# Patient Record
Sex: Female | Born: 1940
Health system: Southern US, Community
[De-identification: ages and names within clinical notes are randomized; demographics above are authoritative.]

## PROBLEM LIST (undated history)

## (undated) DIAGNOSIS — I2510 Atherosclerotic heart disease of native coronary artery without angina pectoris: Secondary | ICD-10-CM

## (undated) DIAGNOSIS — J45909 Unspecified asthma, uncomplicated: Secondary | ICD-10-CM

## (undated) DIAGNOSIS — Z531 Procedure and treatment not carried out because of patient's decision for reasons of belief and group pressure: Secondary | ICD-10-CM

## (undated) DIAGNOSIS — T8859XA Other complications of anesthesia, initial encounter: Secondary | ICD-10-CM

## (undated) DIAGNOSIS — I82409 Acute embolism and thrombosis of unspecified deep veins of unspecified lower extremity: Secondary | ICD-10-CM

## (undated) DIAGNOSIS — T4145XA Adverse effect of unspecified anesthetic, initial encounter: Secondary | ICD-10-CM

## (undated) DIAGNOSIS — IMO0001 Reserved for inherently not codable concepts without codable children: Secondary | ICD-10-CM

## (undated) DIAGNOSIS — M1990 Unspecified osteoarthritis, unspecified site: Secondary | ICD-10-CM

## (undated) DIAGNOSIS — E041 Nontoxic single thyroid nodule: Secondary | ICD-10-CM

## (undated) HISTORY — PX: LIPOMA EXCISION: SHX5283

## (undated) HISTORY — PX: TOTAL KNEE ARTHROPLASTY: SHX125

## (undated) HISTORY — PX: DILATION AND CURETTAGE OF UTERUS: SHX78

---
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
## 1998-09-25 ENCOUNTER — Emergency Department (HOSPITAL_COMMUNITY): Admission: EM | Admit: 1998-09-25 | Discharge: 1998-09-25 | Payer: Self-pay | Admitting: *Deleted

## 2000-01-09 ENCOUNTER — Ambulatory Visit (HOSPITAL_COMMUNITY): Admission: RE | Admit: 2000-01-09 | Discharge: 2000-01-09 | Payer: Self-pay | Admitting: *Deleted

## 2002-08-14 ENCOUNTER — Ambulatory Visit (HOSPITAL_COMMUNITY): Admission: RE | Admit: 2002-08-14 | Discharge: 2002-08-14 | Payer: Self-pay | Admitting: Family Medicine

## 2002-08-14 ENCOUNTER — Encounter: Payer: Self-pay | Admitting: Family Medicine

## 2003-04-22 ENCOUNTER — Encounter: Payer: Self-pay | Admitting: Obstetrics and Gynecology

## 2003-04-22 ENCOUNTER — Ambulatory Visit (HOSPITAL_COMMUNITY): Admission: RE | Admit: 2003-04-22 | Discharge: 2003-04-22 | Payer: Self-pay | Admitting: Obstetrics and Gynecology

## 2007-02-09 LAB — CONVERTED CEMR LAB

## 2007-05-27 DIAGNOSIS — Z9189 Other specified personal risk factors, not elsewhere classified: Secondary | ICD-10-CM | POA: Insufficient documentation

## 2007-05-27 DIAGNOSIS — R8789 Other abnormal findings in specimens from female genital organs: Secondary | ICD-10-CM | POA: Insufficient documentation

## 2007-07-02 ENCOUNTER — Ambulatory Visit: Payer: Self-pay | Admitting: Nurse Practitioner

## 2007-07-02 DIAGNOSIS — F411 Generalized anxiety disorder: Secondary | ICD-10-CM

## 2007-07-02 DIAGNOSIS — M19019 Primary osteoarthritis, unspecified shoulder: Secondary | ICD-10-CM | POA: Insufficient documentation

## 2007-07-03 ENCOUNTER — Encounter (INDEPENDENT_AMBULATORY_CARE_PROVIDER_SITE_OTHER): Payer: Self-pay | Admitting: Nurse Practitioner

## 2007-08-05 ENCOUNTER — Encounter (INDEPENDENT_AMBULATORY_CARE_PROVIDER_SITE_OTHER): Payer: Self-pay | Admitting: Nurse Practitioner

## 2007-08-11 ENCOUNTER — Ambulatory Visit: Payer: Self-pay | Admitting: Nurse Practitioner

## 2007-08-11 DIAGNOSIS — K5289 Other specified noninfective gastroenteritis and colitis: Secondary | ICD-10-CM | POA: Insufficient documentation

## 2007-08-14 ENCOUNTER — Encounter (INDEPENDENT_AMBULATORY_CARE_PROVIDER_SITE_OTHER): Payer: Self-pay | Admitting: Nurse Practitioner

## 2007-08-19 ENCOUNTER — Ambulatory Visit: Payer: Self-pay | Admitting: Nurse Practitioner

## 2007-10-13 ENCOUNTER — Ambulatory Visit: Payer: Self-pay | Admitting: *Deleted

## 2007-10-13 ENCOUNTER — Ambulatory Visit: Payer: Self-pay | Admitting: Nurse Practitioner

## 2007-10-13 DIAGNOSIS — J45909 Unspecified asthma, uncomplicated: Secondary | ICD-10-CM | POA: Insufficient documentation

## 2007-10-23 ENCOUNTER — Telehealth (INDEPENDENT_AMBULATORY_CARE_PROVIDER_SITE_OTHER): Payer: Self-pay | Admitting: Nurse Practitioner

## 2007-10-24 ENCOUNTER — Ambulatory Visit (HOSPITAL_COMMUNITY): Admission: RE | Admit: 2007-10-24 | Discharge: 2007-10-24 | Payer: Self-pay | Admitting: Nurse Practitioner

## 2007-10-28 ENCOUNTER — Encounter (INDEPENDENT_AMBULATORY_CARE_PROVIDER_SITE_OTHER): Payer: Self-pay | Admitting: Nurse Practitioner

## 2007-10-28 ENCOUNTER — Telehealth (INDEPENDENT_AMBULATORY_CARE_PROVIDER_SITE_OTHER): Payer: Self-pay | Admitting: Nurse Practitioner

## 2007-10-28 DIAGNOSIS — M7730 Calcaneal spur, unspecified foot: Secondary | ICD-10-CM

## 2007-10-28 DIAGNOSIS — K029 Dental caries, unspecified: Secondary | ICD-10-CM | POA: Insufficient documentation

## 2007-10-30 ENCOUNTER — Encounter (INDEPENDENT_AMBULATORY_CARE_PROVIDER_SITE_OTHER): Payer: Self-pay | Admitting: Nurse Practitioner

## 2007-11-09 LAB — CONVERTED CEMR LAB: Pap Smear: NORMAL

## 2007-11-18 ENCOUNTER — Encounter (INDEPENDENT_AMBULATORY_CARE_PROVIDER_SITE_OTHER): Payer: Self-pay | Admitting: Nurse Practitioner

## 2007-12-11 ENCOUNTER — Ambulatory Visit: Payer: Self-pay | Admitting: Nurse Practitioner

## 2007-12-11 DIAGNOSIS — R030 Elevated blood-pressure reading, without diagnosis of hypertension: Secondary | ICD-10-CM | POA: Insufficient documentation

## 2007-12-11 DIAGNOSIS — D259 Leiomyoma of uterus, unspecified: Secondary | ICD-10-CM | POA: Insufficient documentation

## 2007-12-23 ENCOUNTER — Ambulatory Visit (HOSPITAL_COMMUNITY): Admission: RE | Admit: 2007-12-23 | Discharge: 2007-12-23 | Payer: Self-pay | Admitting: Internal Medicine

## 2008-05-13 ENCOUNTER — Encounter (INDEPENDENT_AMBULATORY_CARE_PROVIDER_SITE_OTHER): Payer: Self-pay | Admitting: Nurse Practitioner

## 2008-07-13 ENCOUNTER — Other Ambulatory Visit: Admission: RE | Admit: 2008-07-13 | Discharge: 2008-07-13 | Payer: Self-pay | Admitting: Interventional Radiology

## 2008-07-13 ENCOUNTER — Encounter (INDEPENDENT_AMBULATORY_CARE_PROVIDER_SITE_OTHER): Payer: Self-pay | Admitting: Interventional Radiology

## 2008-07-13 ENCOUNTER — Encounter: Admission: RE | Admit: 2008-07-13 | Discharge: 2008-07-13 | Payer: Self-pay | Admitting: Family Medicine

## 2008-07-15 ENCOUNTER — Emergency Department (HOSPITAL_COMMUNITY): Admission: EM | Admit: 2008-07-15 | Discharge: 2008-07-16 | Payer: Self-pay | Admitting: Emergency Medicine

## 2008-12-14 ENCOUNTER — Encounter: Admission: RE | Admit: 2008-12-14 | Discharge: 2008-12-14 | Payer: Self-pay | Admitting: Family Medicine

## 2009-03-17 ENCOUNTER — Encounter (INDEPENDENT_AMBULATORY_CARE_PROVIDER_SITE_OTHER): Payer: Self-pay | Admitting: Emergency Medicine

## 2009-03-17 ENCOUNTER — Emergency Department (HOSPITAL_COMMUNITY): Admission: EM | Admit: 2009-03-17 | Discharge: 2009-03-17 | Payer: Self-pay | Admitting: Emergency Medicine

## 2009-03-17 ENCOUNTER — Ambulatory Visit: Payer: Self-pay | Admitting: Surgery

## 2009-09-01 ENCOUNTER — Encounter: Admission: RE | Admit: 2009-09-01 | Discharge: 2009-09-01 | Payer: Self-pay | Admitting: Surgery

## 2009-10-11 ENCOUNTER — Encounter: Admission: RE | Admit: 2009-10-11 | Discharge: 2009-10-11 | Payer: Self-pay | Admitting: Surgery

## 2010-05-03 ENCOUNTER — Inpatient Hospital Stay (HOSPITAL_COMMUNITY): Admission: RE | Admit: 2010-05-03 | Discharge: 2010-05-06 | Payer: Self-pay | Admitting: Orthopedic Surgery

## 2010-05-31 ENCOUNTER — Encounter
Admission: RE | Admit: 2010-05-31 | Discharge: 2010-07-12 | Payer: Self-pay | Source: Home / Self Care | Admitting: Surgery

## 2010-11-06 ENCOUNTER — Other Ambulatory Visit (HOSPITAL_COMMUNITY)
Admission: RE | Admit: 2010-11-06 | Discharge: 2010-11-06 | Disposition: A | Discharge status: Home / Self Care | Payer: PRIVATE HEALTH INSURANCE | Source: Ambulatory Visit | Attending: Family Medicine | Admitting: Family Medicine

## 2010-11-06 ENCOUNTER — Other Ambulatory Visit: Payer: Self-pay | Admitting: Family Medicine

## 2010-11-06 DIAGNOSIS — Z124 Encounter for screening for malignant neoplasm of cervix: Secondary | ICD-10-CM | POA: Insufficient documentation

## 2010-11-13 ENCOUNTER — Other Ambulatory Visit: Payer: Self-pay | Admitting: Family Medicine

## 2010-11-13 DIAGNOSIS — E041 Nontoxic single thyroid nodule: Secondary | ICD-10-CM

## 2010-11-21 ENCOUNTER — Ambulatory Visit
Admission: RE | Admit: 2010-11-21 | Discharge: 2010-11-21 | Disposition: A | Discharge status: Home / Self Care | Payer: PRIVATE HEALTH INSURANCE | Source: Ambulatory Visit | Attending: Family Medicine | Admitting: Family Medicine

## 2010-11-21 DIAGNOSIS — E041 Nontoxic single thyroid nodule: Secondary | ICD-10-CM

## 2010-11-24 LAB — COMPREHENSIVE METABOLIC PANEL
ALT: 17 U/L (ref 0–35)
CO2: 23 mEq/L (ref 19–32)
Creatinine, Ser: 0.73 mg/dL (ref 0.4–1.2)
GFR calc non Af Amer: >60 mL/min (ref >60)
Glucose, Bld: 92 mg/dL (ref 70–99)

## 2010-11-24 LAB — BASIC METABOLIC PANEL
BUN: 8 mg/dL (ref 6–23)
BUN: 9 mg/dL (ref 6–23)
CO2: 23 mEq/L (ref 19–32)
CO2: 25 mEq/L (ref 19–32)
CO2: 25 mEq/L (ref 19–32)
Calcium: 8.6 mg/dL (ref 8.4–10.5)
Calcium: 9 mg/dL (ref 8.4–10.5)
Chloride: 102 mEq/L (ref 96–112)
Chloride: 104 mEq/L (ref 96–112)
Chloride: 108 mEq/L (ref 96–112)
Creatinine, Ser: 0.82 mg/dL (ref 0.4–1.2)
Creatinine, Ser: 0.9 mg/dL (ref 0.4–1.2)
Creatinine, Ser: 1.5 mg/dL — ABNORMAL HIGH (ref 0.4–1.2)
Glucose, Bld: 108 mg/dL — ABNORMAL HIGH (ref 70–99)
Potassium: 4.1 mEq/L (ref 3.5–5.1)
Sodium: 137 mEq/L (ref 135–145)

## 2010-11-24 LAB — CBC
HCT: 30 % — ABNORMAL LOW (ref 36.0–46.0)
HCT: 31.8 % — ABNORMAL LOW (ref 36.0–46.0)
Hemoglobin: 12.9 g/dL (ref 12.0–15.0)
MCH: 30.3 pg (ref 26.0–34.0)
MCH: 30.3 pg (ref 26.0–34.0)
MCH: 30.9 pg (ref 26.0–34.0)
MCH: 31.3 pg (ref 26.0–34.0)
MCHC: 33.2 g/dL (ref 30.0–36.0)
MCHC: 33.3 g/dL (ref 30.0–36.0)
MCHC: 34.1 g/dL (ref 30.0–36.0)
MCV: 90.9 fL (ref 78.0–100.0)
MCV: 91.7 fL (ref 78.0–100.0)
MCV: 92.7 fL (ref 78.0–100.0)
MCV: 93.1 fL (ref 78.0–100.0)
Platelets: 206 10*3/uL (ref 150–400)
Platelets: 219 10*3/uL (ref 150–400)
RBC: 3.2 MIL/uL — ABNORMAL LOW (ref 3.87–5.11)
RBC: 4.12 MIL/uL (ref 3.87–5.11)
RDW: 12.8 % (ref 11.5–15.5)
RDW: 13.1 % (ref 11.5–15.5)
RDW: 13.2 % (ref 11.5–15.5)
WBC: 11.1 10*3/uL — ABNORMAL HIGH (ref 4.0–10.5)
WBC: 7.7 10*3/uL (ref 4.0–10.5)
WBC: 9.7 10*3/uL (ref 4.0–10.5)

## 2010-11-24 LAB — URINE CULTURE: Culture  Setup Time: 201108181651

## 2010-11-24 LAB — URINALYSIS, ROUTINE W REFLEX MICROSCOPIC
Glucose, UA: NEGATIVE mg/dL
Ketones, ur: NEGATIVE mg/dL
Specific Gravity, Urine: 1.016 (ref 1.005–1.030)

## 2010-11-24 LAB — SURGICAL PCR SCREEN
MRSA, PCR: NEGATIVE
Staphylococcus aureus: NEGATIVE

## 2010-11-24 LAB — PROTIME-INR
INR: 0.98 (ref 0.00–1.49)
Prothrombin Time: 15.9 seconds — ABNORMAL HIGH (ref 11.6–15.2)

## 2010-11-24 LAB — URINE MICROSCOPIC-ADD ON

## 2010-12-14 ENCOUNTER — Ambulatory Visit: Payer: PRIVATE HEALTH INSURANCE | Attending: Orthopedic Surgery | Admitting: Physical Therapy

## 2010-12-14 DIAGNOSIS — M25619 Stiffness of unspecified shoulder, not elsewhere classified: Secondary | ICD-10-CM | POA: Insufficient documentation

## 2010-12-14 DIAGNOSIS — IMO0001 Reserved for inherently not codable concepts without codable children: Secondary | ICD-10-CM | POA: Insufficient documentation

## 2010-12-14 DIAGNOSIS — M25519 Pain in unspecified shoulder: Secondary | ICD-10-CM | POA: Insufficient documentation

## 2010-12-17 LAB — COMPREHENSIVE METABOLIC PANEL
Alkaline Phosphatase: 73 U/L (ref 39–117)
BUN: 15 mg/dL (ref 6–23)
CO2: 23 mEq/L (ref 19–32)
Chloride: 106 mEq/L (ref 96–112)
Creatinine, Ser: 0.8 mg/dL (ref 0.4–1.2)
GFR calc non Af Amer: >60 mL/min (ref >60)
Glucose, Bld: 82 mg/dL (ref 70–99)
Potassium: 3.5 mEq/L (ref 3.5–5.1)
Total Bilirubin: 0.6 mg/dL (ref 0.3–1.2)

## 2010-12-17 LAB — POCT I-STAT, CHEM 8
Calcium, Ion: 1.15 mmol/L (ref 1.12–1.32)
Chloride: 108 mEq/L (ref 96–112)
Glucose, Bld: 88 mg/dL (ref 70–99)
HCT: 37 % (ref 36.0–46.0)
Hemoglobin: 12.6 g/dL (ref 12.0–15.0)
Potassium: 3.5 mEq/L (ref 3.5–5.1)

## 2010-12-17 LAB — DIFFERENTIAL
Basophils Absolute: 0 10*3/uL (ref 0.0–0.1)
Basophils Relative: 0 % (ref 0–1)
Eosinophils Absolute: 0.1 10*3/uL (ref 0.0–0.7)
Monocytes Absolute: 0.6 10*3/uL (ref 0.1–1.0)
Monocytes Relative: 7 % (ref 3–12)
Neutro Abs: 5.6 10*3/uL (ref 1.7–7.7)
Neutrophils Relative %: 69 % (ref 43–77)

## 2010-12-17 LAB — POCT CARDIAC MARKERS

## 2010-12-17 LAB — CBC
MCHC: 34 g/dL (ref 30.0–36.0)
MCV: 94.6 fL (ref 78.0–100.0)
RBC: 3.84 MIL/uL — ABNORMAL LOW (ref 3.87–5.11)
RDW: 13.9 % (ref 11.5–15.5)

## 2010-12-17 LAB — LIPASE, BLOOD: Lipase: 25 U/L (ref 11–59)

## 2010-12-22 ENCOUNTER — Ambulatory Visit: Payer: PRIVATE HEALTH INSURANCE | Admitting: Physical Therapy

## 2010-12-26 ENCOUNTER — Ambulatory Visit: Payer: PRIVATE HEALTH INSURANCE | Admitting: Physical Therapy

## 2010-12-28 ENCOUNTER — Ambulatory Visit: Payer: PRIVATE HEALTH INSURANCE | Admitting: Physical Therapy

## 2011-01-02 ENCOUNTER — Ambulatory Visit: Payer: PRIVATE HEALTH INSURANCE | Admitting: Physical Therapy

## 2011-01-05 ENCOUNTER — Ambulatory Visit: Payer: PRIVATE HEALTH INSURANCE | Admitting: Physical Therapy

## 2011-01-09 ENCOUNTER — Ambulatory Visit: Payer: PRIVATE HEALTH INSURANCE | Attending: Nurse Practitioner | Admitting: Physical Therapy

## 2011-01-09 DIAGNOSIS — M25619 Stiffness of unspecified shoulder, not elsewhere classified: Secondary | ICD-10-CM | POA: Insufficient documentation

## 2011-01-09 DIAGNOSIS — M25519 Pain in unspecified shoulder: Secondary | ICD-10-CM | POA: Insufficient documentation

## 2011-01-09 DIAGNOSIS — IMO0001 Reserved for inherently not codable concepts without codable children: Secondary | ICD-10-CM | POA: Insufficient documentation

## 2011-01-11 ENCOUNTER — Ambulatory Visit: Payer: PRIVATE HEALTH INSURANCE | Admitting: Physical Therapy

## 2011-01-23 ENCOUNTER — Encounter: Payer: PRIVATE HEALTH INSURANCE | Admitting: Physical Therapy

## 2011-01-23 NOTE — Consult Note (Signed)
NAMEFIZZA, SCALES                 ACCOUNT NO.:  [id number]   MEDICAL RECORD NO.:  [id number]          PATIENT TYPE:  EMS   LOCATION:  [facility]                         FACILITY:  [facility]   PHYSICIAN:  Corinna L. Lendell Caprice, MDDATE OF BIRTH:  [birth year]   DATE OF CONSULTATION:  03/17/2009  DATE OF DISCHARGE:  03/17/2009                                 CONSULTATION   REQUESTING PHYSICIAN:  Trudi Ida. Denton Lank, MD   REASON FOR CONSULTATION:  Chest pain, evaluate for admission.   IMPRESSION/RECOMMENDATIONS:  1. Upper abdominal/bilateral lower chest pain:  This is very atypical      for cardiac etiology.  Her pain has subsided.  She has negative      cardiac enzymes, normal chest x-ray, and normal EKG.  She has      little in the way of risk factors for heart disease.  At this      point, she is stable for outpatient workup.  I have called and      discussed with [facility] Cardiology staff.  They will call the patient      to arrange an outpatient stress test.  The patient feels      comfortable with this plan.  She will be discharged from the      emergency room and I have recommended aspirin 81 mg a day until      workup.  2. Osteoarthritis.   HISTORY OF PRESENT ILLNESS:  Ms. Pasquini is a pleasant 70 year old black  female without much past medical history, who presents with upper  abdominal and lower chest pain, spanning to both sides of her chest  across her abdomen and through her low back.  It was not pleuritic or  positional.  Her pain subsided.  Her daughter gave her an aspirin.  She  was shopping at a department store while it occurred.  She had some  transient shortness of breath which is gone.  She has not had any cough,  fevers, chills, dyspepsia, or vomiting.  She did take some Naprosyn  yesterday.  She has no shortness of breath currently nor does she have a  history of dyspnea on exertion.  She reports having had a stress test  many years ago which is normal, records are unavailable.   PAST MEDICAL HISTORY:  Osteoarthritis of the knees.   SOCIAL HISTORY:  The patient drinks occasionally.  She does not smoke or  use drugs.   FAMILY HISTORY:  Her brother died of a massive heart attack at about age  11.   PAST SURGICAL HISTORY:  Bilateral tubal ligation and lipoma removal.   REVIEW OF SYSTEMS:  As above, otherwise negative.   PHYSICAL EXAMINATION:  VITAL SIGNS;  Temperature is 98.4, blood pressure  157/76, pulse 69, respiratory rate 13, and oxygen saturation 99% on room  air.  GENERAL:  The patient is an obese, black female who is eating a sandwich  in no acute distress.  HEENT:  Normocephalic and atraumatic.  Pupils are equal, round, and  reactive to light.  Sclerae are nonicteric.  Moist mucous membranes.  NECK:  Supple.  No carotid bruits.  LUNGS:  Clear to auscultation bilaterally without wheezes, rhonchi, or  rales.  CARDIOVASCULAR:  Regular rate and rhythm without murmurs, gallops, or  rubs.  No chest wall tenderness.  ABDOMEN:  Soft, nontender, nondistended, and obese.  GU AND RECTAL:  Deferred.  EXTREMITIES:  No clubbing, cyanosis, or edema.  No calf tenderness.  Homans sign negative bilaterally.  SKIN:  No rash.  PSYCHIATRIC:  Normal affect.  NEUROLOGIC:  Alert and oriented.  Cranial nerves and sensorimotor exam  are intact.   LABORATORY DATA:  CBC normal.  Complete metabolic panel normal.  Lipase  normal.  Cardiac enzymes negative.  EKG shows normal sinus rhythm.  Chest x-ray shows borderline cardiomegaly, nothing acute.  Doppler of  the left leg shows no DVT.      Corinna L. Lendell Caprice, MD  Electronically Signed     CLS/MEDQ  D:  03/17/2009  T:  03/18/2009  Job:  [id number]

## 2011-01-25 ENCOUNTER — Ambulatory Visit: Payer: PRIVATE HEALTH INSURANCE | Admitting: Physical Therapy

## 2011-01-30 ENCOUNTER — Encounter: Payer: PRIVATE HEALTH INSURANCE | Admitting: Physical Therapy

## 2011-02-01 ENCOUNTER — Encounter: Payer: PRIVATE HEALTH INSURANCE | Admitting: Physical Therapy

## 2011-03-28 ENCOUNTER — Other Ambulatory Visit (HOSPITAL_BASED_OUTPATIENT_CLINIC_OR_DEPARTMENT_OTHER): Payer: Self-pay | Admitting: Family Medicine

## 2011-03-28 ENCOUNTER — Other Ambulatory Visit: Payer: Self-pay | Admitting: Family Medicine

## 2011-03-28 DIAGNOSIS — N63 Unspecified lump in unspecified breast: Secondary | ICD-10-CM

## 2011-04-03 ENCOUNTER — Other Ambulatory Visit: Payer: PRIVATE HEALTH INSURANCE

## 2011-06-12 LAB — DIFFERENTIAL
Eosinophils Absolute: 0.2
Lymphocytes Relative: 19
Lymphs Abs: 1.8
Neutro Abs: 7.1
Neutrophils Relative %: 73

## 2011-06-12 LAB — CBC
MCV: 95.6
Platelets: 245
RBC: 3.54 — ABNORMAL LOW
WBC: 9.6

## 2011-09-20 ENCOUNTER — Ambulatory Visit: Payer: PRIVATE HEALTH INSURANCE | Attending: Family Medicine | Admitting: Physical Therapy

## 2011-09-20 DIAGNOSIS — M545 Low back pain, unspecified: Secondary | ICD-10-CM | POA: Insufficient documentation

## 2011-09-20 DIAGNOSIS — Z96659 Presence of unspecified artificial knee joint: Secondary | ICD-10-CM | POA: Insufficient documentation

## 2011-09-20 DIAGNOSIS — IMO0001 Reserved for inherently not codable concepts without codable children: Secondary | ICD-10-CM | POA: Insufficient documentation

## 2011-09-25 ENCOUNTER — Ambulatory Visit: Payer: PRIVATE HEALTH INSURANCE | Admitting: Physical Therapy

## 2011-10-04 ENCOUNTER — Ambulatory Visit: Payer: PRIVATE HEALTH INSURANCE | Admitting: Physical Therapy

## 2011-10-09 ENCOUNTER — Ambulatory Visit: Payer: PRIVATE HEALTH INSURANCE | Admitting: Physical Therapy

## 2011-11-08 ENCOUNTER — Ambulatory Visit: Payer: No Typology Code available for payment source | Attending: Family Medicine | Admitting: Physical Therapy

## 2011-11-08 DIAGNOSIS — M545 Low back pain, unspecified: Secondary | ICD-10-CM | POA: Insufficient documentation

## 2011-11-08 DIAGNOSIS — M542 Cervicalgia: Secondary | ICD-10-CM | POA: Insufficient documentation

## 2011-11-08 DIAGNOSIS — M546 Pain in thoracic spine: Secondary | ICD-10-CM | POA: Insufficient documentation

## 2011-11-08 DIAGNOSIS — IMO0001 Reserved for inherently not codable concepts without codable children: Secondary | ICD-10-CM | POA: Insufficient documentation

## 2011-11-09 ENCOUNTER — Encounter: Payer: PRIVATE HEALTH INSURANCE | Admitting: Physical Therapy

## 2011-11-13 ENCOUNTER — Ambulatory Visit: Payer: No Typology Code available for payment source | Attending: Family Medicine | Admitting: Physical Therapy

## 2011-11-13 DIAGNOSIS — Z96659 Presence of unspecified artificial knee joint: Secondary | ICD-10-CM | POA: Insufficient documentation

## 2011-11-13 DIAGNOSIS — M545 Low back pain, unspecified: Secondary | ICD-10-CM | POA: Insufficient documentation

## 2011-11-13 DIAGNOSIS — IMO0001 Reserved for inherently not codable concepts without codable children: Secondary | ICD-10-CM | POA: Insufficient documentation

## 2011-11-15 ENCOUNTER — Encounter: Payer: PRIVATE HEALTH INSURANCE | Admitting: Physical Therapy

## 2011-11-20 ENCOUNTER — Ambulatory Visit: Payer: No Typology Code available for payment source | Admitting: Physical Therapy

## 2011-11-27 ENCOUNTER — Ambulatory Visit: Payer: No Typology Code available for payment source | Admitting: Physical Therapy

## 2011-11-29 ENCOUNTER — Ambulatory Visit: Payer: No Typology Code available for payment source | Admitting: Physical Therapy

## 2011-12-04 ENCOUNTER — Ambulatory Visit: Payer: No Typology Code available for payment source | Admitting: Physical Therapy

## 2011-12-06 ENCOUNTER — Encounter: Payer: PRIVATE HEALTH INSURANCE | Admitting: Physical Therapy

## 2012-08-06 ENCOUNTER — Encounter (HOSPITAL_COMMUNITY): Payer: Self-pay | Admitting: Pharmacy Technician

## 2012-08-14 NOTE — H&P (Signed)
  [facility] [address]   [address] [place], [place] [address] [phone] A Division of [facility]  Loreta Ave, M.D.   Robert A. Thurston Hole, M.D.   Burnell Blanks, M.D.   Eulas Post, M.D.   Lunette Stands, M.D Buford Dresser, M.D.  Charlsie Quest, M.D.   Estell Harpin, M.D.   Melina Fiddler, M.D. Genene Churn. Barry Dienes, PA-C            Kirstin A. Shepperson, PA-C Josh [place], PA-C [place], [place]   RE: Karen Owens, Karen Owens   [id number]      DOB: May 26, 1941 PROGRESS NOTE: 08-12-12 Chief complaint: right knee pain. History of present illness: 71 year old black female with right knee end stage degenerative joint disease. Symptoms unchanged from previous visit and she wants to proceed with total knee replacement as scheduled. We received clearance from Dr. Cliffton Asters. Current medications: multivitamin Ventolin. No known drug allergies. Past medical/surgical history: asthma fibroids obesity hypercholesterolemia thyroid nodules left total knee replacement. Family history: positive hypertension stroke coronary artery disease liver cancer. Social history: she's divorced Jehovah's witness. Quit smoking 1983 rare alcohol consumption. Review of systems: negative.  EXAMINATION: Alert and oriented x3 in no acute distress. Height 5'6" weight 227 pounds. Blood pressure 157/70. Respirations 20. Pulse 77. Temp 97.1. Head is normal cephalic atraumatic. PERRLA and EOMI. Neck unremarkable. Lungs CTA bilaterally. No wheezes noted. Heart regular rate and rhythm no murmurs. Abdomen round non-distended. NABS x4. Soft non-tender. Right knee decreased range of motion positive crepitus joint line tenderness positive effusion ligaments stable.  Calf non-tender neurovascularly intact. Skin warm and dry. No increase in respiratory effort.   X-RAYS: Previous films right knee AP lateral sunrise show end stage degenerative joint disease with periarticular  spurs.  IMPRESSION: Right knee end stage degenerative joint disease and chronic pain.  DISPOSITION: We'll proceed with right total knee replacement as scheduled. Surgery along with potential rehab/recovery time discussed.  All questions answered.  Loreta Ave, M.D.  Electronically verified by Loreta Ave, M.D. DFM(JMO):kh D 08-13-12 T 08-14-12

## 2012-08-15 ENCOUNTER — Encounter (HOSPITAL_COMMUNITY)
Admission: RE | Admit: 2012-08-15 | Discharge: 2012-08-15 | Disposition: A | Discharge status: Home / Self Care | Payer: PRIVATE HEALTH INSURANCE | Source: Ambulatory Visit | Attending: Orthopedic Surgery | Admitting: Orthopedic Surgery

## 2012-08-15 ENCOUNTER — Encounter (HOSPITAL_COMMUNITY)
Admission: RE | Admit: 2012-08-15 | Discharge: 2012-08-15 | Disposition: A | Discharge status: Home / Self Care | Payer: PRIVATE HEALTH INSURANCE | Source: Ambulatory Visit | Attending: Surgery | Admitting: Surgery

## 2012-08-15 ENCOUNTER — Encounter (HOSPITAL_COMMUNITY): Payer: Self-pay

## 2012-08-15 HISTORY — DX: Other complications of anesthesia, initial encounter: T88.59XA

## 2012-08-15 HISTORY — DX: Adverse effect of unspecified anesthetic, initial encounter: T41.45XA

## 2012-08-15 HISTORY — DX: Unspecified asthma, uncomplicated: J45.909

## 2012-08-15 HISTORY — DX: Unspecified osteoarthritis, unspecified site: M19.90

## 2012-08-15 HISTORY — DX: Nontoxic single thyroid nodule: E04.1

## 2012-08-15 LAB — CBC
HCT: 37.5 % (ref 36.0–46.0)
MCH: 31.2 pg (ref 26.0–34.0)
MCHC: 33.6 g/dL (ref 30.0–36.0)
MCV: 92.8 fL (ref 78.0–100.0)
RDW: 12.6 % (ref 11.5–15.5)

## 2012-08-15 LAB — URINALYSIS, ROUTINE W REFLEX MICROSCOPIC
Bilirubin Urine: NEGATIVE
Nitrite: NEGATIVE
Specific Gravity, Urine: 1.029 (ref 1.005–1.030)
Urobilinogen, UA: 0.2 mg/dL (ref 0.0–1.0)

## 2012-08-15 LAB — PROTIME-INR
INR: 0.98 (ref 0.00–1.49)
Prothrombin Time: 12.9 seconds (ref 11.6–15.2)

## 2012-08-15 LAB — COMPREHENSIVE METABOLIC PANEL
Albumin: 3.6 g/dL (ref 3.5–5.2)
BUN: 14 mg/dL (ref 6–23)
Calcium: 9.9 mg/dL (ref 8.4–10.5)
Creatinine, Ser: 0.84 mg/dL (ref 0.50–1.10)
Total Bilirubin: 0.5 mg/dL (ref 0.3–1.2)
Total Protein: 7.3 g/dL (ref 6.0–8.3)

## 2012-08-15 LAB — URINE MICROSCOPIC-ADD ON

## 2012-08-15 NOTE — Progress Notes (Signed)
Pt is Jehovah's Witness, signed blood refusal form, states Dr. Eulah Pont is aware that she will not accept blood products.

## 2012-08-15 NOTE — Pre-Procedure Instructions (Signed)
20 Karen Owens  08/15/2012   Your procedure is scheduled on:  Monday December 11  Report to [facility] at 8:15 AM.  Call this number if you have problems the morning of surgery: [phone]   Remember:   Do not eat or drink:After Midnight.    Take these medicines the morning of surgery with A SIP OF WATER: None   Do not wear jewelry, make-up or nail polish.  Do not wear lotions, powders, or perfumes. You may wear deodorant.  Do not shave 48 hours prior to surgery. Men may shave face and neck.  Do not bring valuables to the hospital.  Contacts, dentures or bridgework may not be worn into surgery.  Leave suitcase in the car. After surgery it may be brought to your room.  For patients admitted to the hospital, checkout time is 11:00 AM the day of discharge.   Patients discharged the day of surgery will not be allowed to drive home.  Name and phone number of your driver: NA  Special Instructions: Shower using CHG 2 nights before surgery and the night before surgery.  If you shower the day of surgery use CHG.  Use special wash - you have one bottle of CHG for all showers.  You should use approximately 1/3 of the bottle for each shower.   Please read over the following fact sheets that you were given: Incentive Spirometry, Pain Booklet, Coughing and Deep Breathing, Blood Transfusion Information and Surgical Site Infection Prevention

## 2012-08-16 LAB — URINE CULTURE

## 2012-08-19 MED ORDER — CEFAZOLIN SODIUM-DEXTROSE 2-3 GM-% IV SOLR
2.0000 g | INTRAVENOUS | Status: AC
  Administered 2012-08-20: 2 g via INTRAVENOUS
  Filled 2012-08-19: qty 50

## 2012-08-20 ENCOUNTER — Inpatient Hospital Stay (HOSPITAL_COMMUNITY)
Admission: RE | Admit: 2012-08-20 | Discharge: 2012-08-23 | DRG: 470 | Disposition: A | Discharge status: Home Health | Payer: PRIVATE HEALTH INSURANCE | Source: Ambulatory Visit | Attending: Orthopedic Surgery | Admitting: Orthopedic Surgery

## 2012-08-20 ENCOUNTER — Inpatient Hospital Stay (HOSPITAL_COMMUNITY): Payer: PRIVATE HEALTH INSURANCE

## 2012-08-20 ENCOUNTER — Encounter (HOSPITAL_COMMUNITY): Payer: Self-pay | Admitting: *Deleted

## 2012-08-20 ENCOUNTER — Encounter (HOSPITAL_COMMUNITY)
Admission: RE | Disposition: A | Discharge status: Home Health | Payer: Self-pay | Source: Ambulatory Visit | Attending: Orthopedic Surgery

## 2012-08-20 ENCOUNTER — Inpatient Hospital Stay (HOSPITAL_COMMUNITY): Payer: PRIVATE HEALTH INSURANCE | Admitting: Anesthesiology

## 2012-08-20 ENCOUNTER — Encounter (HOSPITAL_COMMUNITY): Payer: Self-pay | Admitting: Anesthesiology

## 2012-08-20 DIAGNOSIS — E669 Obesity, unspecified: Secondary | ICD-10-CM | POA: Diagnosis present

## 2012-08-20 DIAGNOSIS — M1710 Unilateral primary osteoarthritis, unspecified knee: Principal | ICD-10-CM | POA: Diagnosis present

## 2012-08-20 DIAGNOSIS — E7800 Pure hypercholesterolemia, unspecified: Secondary | ICD-10-CM | POA: Diagnosis present

## 2012-08-20 DIAGNOSIS — Z6837 Body mass index (BMI) 37.0-37.9, adult: Secondary | ICD-10-CM

## 2012-08-20 DIAGNOSIS — D62 Acute posthemorrhagic anemia: Secondary | ICD-10-CM | POA: Diagnosis not present

## 2012-08-20 DIAGNOSIS — Z96659 Presence of unspecified artificial knee joint: Secondary | ICD-10-CM

## 2012-08-20 DIAGNOSIS — G8929 Other chronic pain: Secondary | ICD-10-CM | POA: Diagnosis present

## 2012-08-20 DIAGNOSIS — J45909 Unspecified asthma, uncomplicated: Secondary | ICD-10-CM | POA: Diagnosis present

## 2012-08-20 DIAGNOSIS — Z01812 Encounter for preprocedural laboratory examination: Secondary | ICD-10-CM

## 2012-08-20 HISTORY — PX: TOTAL KNEE ARTHROPLASTY: SHX125

## 2012-08-20 HISTORY — DX: Reserved for inherently not codable concepts without codable children: IMO0001

## 2012-08-20 HISTORY — DX: Procedure and treatment not carried out because of patient's decision for reasons of belief and group pressure: Z53.1

## 2012-08-20 SURGERY — ARTHROPLASTY, KNEE, TOTAL
Anesthesia: General | Site: Knee | Laterality: Right

## 2012-08-20 MED ORDER — ALUM & MAG HYDROXIDE-SIMETH 200-200-20 MG/5ML PO SUSP: 30.0000 mL | ORAL | Status: DC | PRN

## 2012-08-20 MED ORDER — HYDROMORPHONE HCL PF 1 MG/ML IJ SOLN
0.2500 mg | INTRAMUSCULAR | Status: DC | PRN
  Administered 2012-08-20 (×2): 0.5 mg via INTRAVENOUS

## 2012-08-20 MED ORDER — DEXAMETHASONE SODIUM PHOSPHATE 4 MG/ML IJ SOLN
INTRAMUSCULAR | Status: DC | PRN
  Administered 2012-08-20: 4 mg via INTRAVENOUS

## 2012-08-20 MED ORDER — WARFARIN SODIUM 7.5 MG PO TABS
7.5000 mg | ORAL_TABLET | Freq: Once | ORAL | Status: AC
  Administered 2012-08-20: 7.5 mg via ORAL
  Filled 2012-08-20: qty 1

## 2012-08-20 MED ORDER — GLYCOPYRROLATE 0.2 MG/ML IJ SOLN
INTRAMUSCULAR | Status: DC | PRN
  Administered 2012-08-20: 0.6 mg via INTRAVENOUS

## 2012-08-20 MED ORDER — BISACODYL 10 MG RE SUPP: 10.0000 mg | Freq: Every day | RECTAL | Status: DC | PRN

## 2012-08-20 MED ORDER — HYDROMORPHONE HCL PF 1 MG/ML IJ SOLN
INTRAMUSCULAR | Status: AC
  Filled 2012-08-20: qty 1

## 2012-08-20 MED ORDER — MIDAZOLAM HCL 2 MG/2ML IJ SOLN
1.0000 mg | INTRAMUSCULAR | Status: DC | PRN
  Administered 2012-08-20: 2 mg via INTRAVENOUS

## 2012-08-20 MED ORDER — METHOCARBAMOL 500 MG PO TABS
500.0000 mg | ORAL_TABLET | Freq: Four times a day (QID) | ORAL | Status: DC | PRN
  Administered 2012-08-21 – 2012-08-23 (×3): 500 mg via ORAL
  Filled 2012-08-20 (×3): qty 1

## 2012-08-20 MED ORDER — ONDANSETRON HCL 4 MG PO TABS: 4.0000 mg | ORAL_TABLET | Freq: Four times a day (QID) | ORAL | Status: DC | PRN

## 2012-08-20 MED ORDER — COUMADIN BOOK
Freq: Once | Status: DC
  Filled 2012-08-20: qty 1

## 2012-08-20 MED ORDER — ONDANSETRON HCL 4 MG/2ML IJ SOLN: 4.0000 mg | Freq: Four times a day (QID) | INTRAMUSCULAR | Status: DC | PRN

## 2012-08-20 MED ORDER — SENNOSIDES-DOCUSATE SODIUM 8.6-50 MG PO TABS
1.0000 | ORAL_TABLET | Freq: Every evening | ORAL | Status: DC | PRN
  Administered 2012-08-22: 1 via ORAL
  Filled 2012-08-20: qty 1

## 2012-08-20 MED ORDER — SODIUM CHLORIDE 0.9 % IJ SOLN
INTRAMUSCULAR | Status: AC
  Filled 2012-08-20: qty 6

## 2012-08-20 MED ORDER — LIDOCAINE HCL (CARDIAC) 20 MG/ML IV SOLN
INTRAVENOUS | Status: DC | PRN
  Administered 2012-08-20: 100 mg via INTRAVENOUS

## 2012-08-20 MED ORDER — ENOXAPARIN SODIUM 30 MG/0.3ML ~~LOC~~ SOLN
30.0000 mg | Freq: Two times a day (BID) | SUBCUTANEOUS | Status: DC
  Administered 2012-08-21 – 2012-08-23 (×5): 30 mg via SUBCUTANEOUS
  Filled 2012-08-20 (×7): qty 0.3

## 2012-08-20 MED ORDER — HYDROCODONE-ACETAMINOPHEN 10-325 MG PO TABS
1.0000 | ORAL_TABLET | ORAL | Status: DC | PRN
  Administered 2012-08-20: 2 via ORAL
  Administered 2012-08-21 – 2012-08-22 (×2): 1 via ORAL
  Administered 2012-08-22: 2 via ORAL
  Administered 2012-08-22 – 2012-08-23 (×3): 1 via ORAL
  Filled 2012-08-20: qty 1
  Filled 2012-08-20 (×4): qty 2
  Filled 2012-08-20: qty 1
  Filled 2012-08-20 (×2): qty 2

## 2012-08-20 MED ORDER — ALBUTEROL SULFATE HFA 108 (90 BASE) MCG/ACT IN AERS
2.0000 | INHALATION_SPRAY | RESPIRATORY_TRACT | Status: DC | PRN
  Filled 2012-08-20: qty 6.7

## 2012-08-20 MED ORDER — BUPIVACAINE LIPOSOME 1.3 % IJ SUSP
INTRAMUSCULAR | Status: DC | PRN
  Administered 2012-08-20: 20 mL

## 2012-08-20 MED ORDER — WARFARIN VIDEO: Freq: Once | Status: DC

## 2012-08-20 MED ORDER — MENTHOL 3 MG MT LOZG: 1.0000 | LOZENGE | OROMUCOSAL | Status: DC | PRN

## 2012-08-20 MED ORDER — METHOCARBAMOL 100 MG/ML IJ SOLN
500.0000 mg | Freq: Four times a day (QID) | INTRAVENOUS | Status: DC | PRN
  Administered 2012-08-20: 500 mg via INTRAVENOUS
  Filled 2012-08-20: qty 5

## 2012-08-20 MED ORDER — FENTANYL CITRATE 0.05 MG/ML IJ SOLN
INTRAMUSCULAR | Status: DC | PRN
  Administered 2012-08-20 (×2): 50 ug via INTRAVENOUS
  Administered 2012-08-20: 150 ug via INTRAVENOUS

## 2012-08-20 MED ORDER — HYDROMORPHONE HCL PF 1 MG/ML IJ SOLN
0.5000 mg | INTRAMUSCULAR | Status: DC | PRN
  Administered 2012-08-20 – 2012-08-21 (×2): 0.5 mg via INTRAVENOUS
  Filled 2012-08-20 (×2): qty 1

## 2012-08-20 MED ORDER — DOCUSATE SODIUM 100 MG PO CAPS
100.0000 mg | ORAL_CAPSULE | Freq: Two times a day (BID) | ORAL | Status: DC
  Administered 2012-08-20 – 2012-08-23 (×6): 100 mg via ORAL
  Filled 2012-08-20 (×7): qty 1

## 2012-08-20 MED ORDER — METOCLOPRAMIDE HCL 5 MG/ML IJ SOLN: 5.0000 mg | Freq: Three times a day (TID) | INTRAMUSCULAR | Status: DC | PRN

## 2012-08-20 MED ORDER — MIDAZOLAM HCL 5 MG/5ML IJ SOLN
INTRAMUSCULAR | Status: DC | PRN
  Administered 2012-08-20: 1 mg via INTRAVENOUS

## 2012-08-20 MED ORDER — ACETAMINOPHEN 650 MG RE SUPP: 650.0000 mg | Freq: Four times a day (QID) | RECTAL | Status: DC | PRN

## 2012-08-20 MED ORDER — MIDAZOLAM HCL 2 MG/2ML IJ SOLN
INTRAMUSCULAR | Status: AC
  Filled 2012-08-20: qty 2

## 2012-08-20 MED ORDER — FENTANYL CITRATE 0.05 MG/ML IJ SOLN
50.0000 ug | Freq: Once | INTRAMUSCULAR | Status: AC
  Administered 2012-08-20: 50 ug via INTRAVENOUS

## 2012-08-20 MED ORDER — PHENOL 1.4 % MT LIQD: 1.0000 | OROMUCOSAL | Status: DC | PRN

## 2012-08-20 MED ORDER — WARFARIN - PHARMACIST DOSING INPATIENT: Freq: Every day | Status: DC

## 2012-08-20 MED ORDER — SODIUM CHLORIDE 0.9 % IJ SOLN
INTRAMUSCULAR | Status: DC | PRN
  Administered 2012-08-20: 20 mL

## 2012-08-20 MED ORDER — NEOSTIGMINE METHYLSULFATE 1 MG/ML IJ SOLN
INTRAMUSCULAR | Status: DC | PRN
  Administered 2012-08-20: 3 mg via INTRAVENOUS

## 2012-08-20 MED ORDER — PROPOFOL 10 MG/ML IV BOLUS
INTRAVENOUS | Status: DC | PRN
  Administered 2012-08-20: 200 mg via INTRAVENOUS

## 2012-08-20 MED ORDER — ONDANSETRON HCL 4 MG/2ML IJ SOLN
INTRAMUSCULAR | Status: DC | PRN
  Administered 2012-08-20: 4 mg via INTRAVENOUS

## 2012-08-20 MED ORDER — ACETAMINOPHEN 325 MG PO TABS: 650.0000 mg | ORAL_TABLET | Freq: Four times a day (QID) | ORAL | Status: DC | PRN

## 2012-08-20 MED ORDER — BUPIVACAINE LIPOSOME 1.3 % IJ SUSP
20.0000 mL | Freq: Once | INTRAMUSCULAR | Status: DC
  Filled 2012-08-20: qty 20

## 2012-08-20 MED ORDER — ROCURONIUM BROMIDE 100 MG/10ML IV SOLN
INTRAVENOUS | Status: DC | PRN
  Administered 2012-08-20: 50 mg via INTRAVENOUS

## 2012-08-20 MED ORDER — METOCLOPRAMIDE HCL 10 MG PO TABS: 5.0000 mg | ORAL_TABLET | Freq: Three times a day (TID) | ORAL | Status: DC | PRN

## 2012-08-20 MED ORDER — FENTANYL CITRATE 0.05 MG/ML IJ SOLN
INTRAMUSCULAR | Status: AC
  Filled 2012-08-20: qty 2

## 2012-08-20 MED ORDER — LACTATED RINGERS IV SOLN
INTRAVENOUS | Status: DC | PRN
  Administered 2012-08-20 (×2): via INTRAVENOUS

## 2012-08-20 MED ORDER — SODIUM CHLORIDE 0.9 % IR SOLN
Status: DC | PRN
  Administered 2012-08-20: 3000 mL

## 2012-08-20 MED ORDER — POTASSIUM CHLORIDE IN NACL 20-0.9 MEQ/L-% IV SOLN
INTRAVENOUS | Status: DC
  Administered 2012-08-20: 18:00:00 via INTRAVENOUS
  Filled 2012-08-20 (×9): qty 1000

## 2012-08-20 SURGICAL SUPPLY — 56 items
BANDAGE ESMARK 6X9 LF (GAUZE/BANDAGES/DRESSINGS) ×1 IMPLANT
BLADE SAG 18X100X1.27 (BLADE) ×4 IMPLANT
BNDG CMPR 9X6 STRL LF SNTH (GAUZE/BANDAGES/DRESSINGS) ×1
BNDG ESMARK 6X9 LF (GAUZE/BANDAGES/DRESSINGS) ×2
BOOTCOVER CLEANROOM LRG (PROTECTIVE WEAR) ×4 IMPLANT
BOWL SMART MIX CTS (DISPOSABLE) ×2 IMPLANT
CEMENT BONE SIMPLEX SPEEDSET (Cement) ×4 IMPLANT
CLOTH BEACON ORANGE TIMEOUT ST (SAFETY) ×2 IMPLANT
COVER BACK TABLE 24X17X13 BIG (DRAPES) ×2 IMPLANT
COVER SURGICAL LIGHT HANDLE (MISCELLANEOUS) ×2 IMPLANT
CUFF TOURNIQUET SINGLE 34IN LL (TOURNIQUET CUFF) ×2 IMPLANT
DRAPE EXTREMITY T 121X128X90 (DRAPE) ×2 IMPLANT
DRAPE PROXIMA HALF (DRAPES) ×2 IMPLANT
DRAPE U-SHAPE 47X51 STRL (DRAPES) ×2 IMPLANT
DRSG PAD ABDOMINAL 8X10 ST (GAUZE/BANDAGES/DRESSINGS) ×2 IMPLANT
DURAPREP 26ML APPLICATOR (WOUND CARE) ×2 IMPLANT
ELECT CAUTERY BLADE 6.4 (BLADE) ×2 IMPLANT
ELECT REM PT RETURN 9FT ADLT (ELECTROSURGICAL) ×2
ELECTRODE REM PT RTRN 9FT ADLT (ELECTROSURGICAL) ×1 IMPLANT
EVACUATOR 1/8 PVC DRAIN (DRAIN) ×2 IMPLANT
FACESHIELD LNG OPTICON STERILE (SAFETY) ×2 IMPLANT
GAUZE XEROFORM 5X9 LF (GAUZE/BANDAGES/DRESSINGS) ×2 IMPLANT
GLOVE BIOGEL PI IND STRL 8 (GLOVE) ×1 IMPLANT
GLOVE BIOGEL PI INDICATOR 8 (GLOVE) ×1
GLOVE ORTHO TXT STRL SZ7.5 (GLOVE) ×2 IMPLANT
GOWN PREVENTION PLUS XLARGE (GOWN DISPOSABLE) ×4 IMPLANT
GOWN STRL NON-REIN LRG LVL3 (GOWN DISPOSABLE) ×4 IMPLANT
GOWN STRL REIN 2XL XLG LVL4 (GOWN DISPOSABLE) ×2 IMPLANT
HANDPIECE INTERPULSE COAX TIP (DISPOSABLE) ×4
IMMOBILIZER KNEE 22 UNIV (SOFTGOODS) ×2 IMPLANT
IMMOBILIZER KNEE 24 THIGH 36 (MISCELLANEOUS) IMPLANT
IMMOBILIZER KNEE 24 UNIV (MISCELLANEOUS)
KIT BASIN OR (CUSTOM PROCEDURE TRAY) ×2 IMPLANT
KIT ROOM TURNOVER OR (KITS) ×2 IMPLANT
MANIFOLD NEPTUNE II (INSTRUMENTS) ×2 IMPLANT
NS IRRIG 1000ML POUR BTL (IV SOLUTION) ×2 IMPLANT
PACK TOTAL JOINT (CUSTOM PROCEDURE TRAY) ×2 IMPLANT
PAD ARMBOARD 7.5X6 YLW CONV (MISCELLANEOUS) ×4 IMPLANT
PAD CAST 4YDX4 CTTN HI CHSV (CAST SUPPLIES) ×1 IMPLANT
PADDING CAST COTTON 4X4 STRL (CAST SUPPLIES) ×2
PADDING CAST COTTON 6X4 STRL (CAST SUPPLIES) ×2 IMPLANT
RUBBERBAND STERILE (MISCELLANEOUS) ×2 IMPLANT
SET HNDPC FAN SPRY TIP SCT (DISPOSABLE) ×1 IMPLANT
SPONGE GAUZE 4X4 12PLY (GAUZE/BANDAGES/DRESSINGS) ×2 IMPLANT
STAPLER VISISTAT 35W (STAPLE) ×2 IMPLANT
SUCTION FRAZIER TIP 10 FR DISP (SUCTIONS) ×2 IMPLANT
SUT VIC AB 1 CTX 36 (SUTURE) ×4
SUT VIC AB 1 CTX36XBRD ANBCTR (SUTURE) ×2 IMPLANT
SUT VIC AB 2-0 CT1 27 (SUTURE) ×4
SUT VIC AB 2-0 CT1 TAPERPNT 27 (SUTURE) ×2 IMPLANT
SYR 30ML LL (SYRINGE) ×2 IMPLANT
SYR 30ML SLIP (SYRINGE) ×2 IMPLANT
TOWEL OR 17X24 6PK STRL BLUE (TOWEL DISPOSABLE) ×2 IMPLANT
TOWEL OR 17X26 10 PK STRL BLUE (TOWEL DISPOSABLE) ×2 IMPLANT
TRAY FOLEY CATH 14FR (SET/KITS/TRAYS/PACK) ×2 IMPLANT
WATER STERILE IRR 1000ML POUR (IV SOLUTION) ×6 IMPLANT

## 2012-08-20 NOTE — Brief Op Note (Signed)
08/20/2012  1:22 PM  PATIENT:  Gerre Pebbles  71 y.o. female  PRE-OPERATIVE DIAGNOSIS:  DJD RIGHT KNEE  POST-OPERATIVE DIAGNOSIS:  degenerative knee  PROCEDURE:  Procedure(s) (LRB) with comments: TOTAL KNEE ARTHROPLASTY (Right)  SURGEON:  Surgeon(s) and Role:    * Loreta Ave, MD - Primary  PHYSICIAN ASSISTANT: Zonia Kief M     ANESTHESIA:   regional and general  EBL:  Total I/O In: 1700 [I.V.:1700] Out: 650 [Urine:650]   SPECIMEN:  No Specimen  DISPOSITION OF SPECIMEN:  N/A  COUNTS:  YES  TOURNIQUET:   Total Tourniquet Time Documented: Thigh (Right) - 75 minutes  PATIENT DISPOSITION:  PACU - hemodynamically stable.

## 2012-08-20 NOTE — Interval H&P Note (Signed)
History and Physical Interval Note:  08/20/2012 8:03 AM  Karen Owens  has presented today for surgery, with the diagnosis of DJD RIGHT KNEE  The various methods of treatment have been discussed with the patient and family. After consideration of risks, benefits and other options for treatment, the patient has consented to  Procedure(s) (LRB) with comments: TOTAL KNEE ARTHROPLASTY (Right) as a surgical intervention .  The patient's history has been reviewed, patient examined, no change in status, stable for surgery.  I have reviewed the patient's chart and labs.  Questions were answered to the patient's satisfaction.     Tahtiana Rozier F

## 2012-08-20 NOTE — Progress Notes (Signed)
ANTICOAGULATION CONSULT NOTE - Initial Consult  Pharmacy Consult for Coumadin Indication: VTE prophylaxis (s/p TKA)  No Known Allergies  Patient Measurements: 104 kg  Labs: No results found for this basename: HGB:2,HCT:3,PLT:3,APTT:3,LABPROT:3,INR:3,HEPARINUNFRC:3,CREATININE:3,CKTOTAL:3,CKMB:3,TROPONINI:3 in the last 72 hours  The CrCl is unknown because both a height and weight (above a minimum accepted value) are required for this calculation.   Medical History: Past Medical History  Diagnosis Date  . Complication of anesthesia     difficulty waking up  . Thyroid nodule   . Asthma   . Arthritis    Assessment: 71 year old female beginning Coumadin s/p TKA Baseline INR = 0.98 Scr stable  Goal of Therapy:  INR 2-3 Monitor platelets by anticoagulation protocol: Yes   Plan:  1) Coumadin 7.5 mg po x 1 dose  2) Daily INR 3) Coumadin education  Thank you. Okey Regal, PharmD [phone]  08/20/2012,3:06 PM

## 2012-08-20 NOTE — Progress Notes (Signed)
Orthopedic Tech Progress Note Patient Details:  Karen Owens May 26, 1941 [id number]  CPM Right Knee CPM Right Knee: On Right Knee Flexion (Degrees): 60  Right Knee Extension (Degrees): 0  Additional Comments: Trapeze bar   Cammer, Mickie Bail 08/20/2012, 2:21 PM

## 2012-08-20 NOTE — Evaluation (Signed)
Physical Therapy Evaluation Patient Details Name: Karen Owens MRN: [id number] DOB: [birth year] Today's Date: 08/20/2012 Time: 4098-1191 PT Time Calculation (min): 42 min  PT Assessment / Plan / Recommendation Clinical Impression  Pt is 71 y/o female s/p R TKA.  Pt lives with her daughter and grandson.  Daughter is an Charity fundraiser.  Activity during eval limited by pt c/o dizziness and nausea.  Acute PT to follow pt to promote mobility for d/c to home with HHPT.    PT Assessment  Patient needs continued PT services    Follow Up Recommendations  Home health PT;Supervision/Assistance - 24 hour    Does the patient have the potential to tolerate intense rehabilitation      Barriers to Discharge None      Equipment Recommendations  None recommended by PT    Recommendations for Other Services OT consult   Frequency 7X/week    Precautions / Restrictions Precautions Precautions: Fall;Knee Required Braces or Orthoses: Knee Immobilizer - Right Restrictions Weight Bearing Restrictions: Yes RLE Weight Bearing: Weight bearing as tolerated   Pertinent Vitals/Pain Pt reporting pain 4/10 in R Knee. RN provided pt with IV pain medication during session.        Mobility  Bed Mobility Bed Mobility: Supine to Sit;Sitting - Scoot to Edge of Bed;Sit to Supine Supine to Sit: 4: Min assist;HOB flat Sitting - Scoot to Edge of Bed: 4: Min assist Sit to Supine: 4: Min assist;HOB flat Details for Bed Mobility Assistance: Cues for technique asisst for R LE.  Transfers Transfers: Not assessed Ambulation/Gait Ambulation/Gait Assistance: Not tested (comment)    Shoulder Instructions     Exercises     PT Diagnosis: Difficulty walking;Generalized weakness;Acute pain  PT Problem List: Decreased strength;Decreased range of motion;Decreased activity tolerance;Decreased balance;Decreased mobility;Decreased knowledge of use of DME;Decreased knowledge of precautions;Obesity;Pain PT Treatment Interventions: DME  instruction;Gait training;Functional mobility training;Stair training;Therapeutic activities;Therapeutic exercise;Balance training;Patient/family education   PT Goals Acute Rehab PT Goals PT Goal Formulation: With patient Time For Goal Achievement: 08/27/12 Potential to Achieve Goals: Good Pt will go Supine/Side to Sit: with supervision PT Goal: Supine/Side to Sit - Progress: Goal set today Pt will go Sit to Supine/Side: with supervision;with HOB 0 degrees PT Goal: Sit to Supine/Side - Progress: Goal set today Pt will go Sit to Stand: with supervision PT Goal: Sit to Stand - Progress: Goal set today Pt will go Stand to Sit: with supervision PT Goal: Stand to Sit - Progress: Goal set today Pt will Ambulate: 51 - 150 feet;with supervision;with rolling walker PT Goal: Ambulate - Progress: Goal set today Pt will Go Up / Down Stairs: 1-2 stairs;with min assist;with rolling walker PT Goal: Up/Down Stairs - Progress: Goal set today Pt will Perform Home Exercise Program: with supervision, verbal cues required/provided PT Goal: Perform Home Exercise Program - Progress: Goal set today  Visit Information  Last PT Received On: 08/20/12 Assistance Needed: +2    Subjective Data  Subjective: Agree to PT eval.   Prior Functioning  Home Living Lives With: Daughter Available Help at Discharge: Family;Available 24 hours/day Type of Home: House Home Access: Stairs to enter [employer] of Steps: 1 Entrance Stairs-Rails: None Home Layout: One level Bathroom Shower/Tub: Walk-in Contractor: Standard Bathroom Accessibility: Yes How Accessible: Accessible via walker Home Adaptive Equipment: Bedside commode/3-in-1;Walker - rolling;Grab bars in shower Prior Function Level of Independence: Independent Able to Take Stairs?: Yes Driving: Yes Vocation: Retired Musician: No difficulties    Cognition  Overall Cognitive Status: Appears  within  functional limits for tasks assessed/performed Arousal/Alertness: Awake/alert Orientation Level: Appears intact for tasks assessed Behavior During Session: [facility] for tasks performed    Extremity/Trunk Assessment Right Lower Extremity Assessment RLE ROM/Strength/Tone: Unable to fully assess Left Lower Extremity Assessment LLE ROM/Strength/Tone: Within functional levels   Balance Balance Balance Assessed: Yes Static Sitting Balance Static Sitting - Balance Support: Feet supported Static Sitting - Level of Assistance: 5: Stand by assistance Static Sitting - Comment/# of Minutes: 5 minutes sitting on EOB with c/o mild dizziness.    End of Session PT - End of Session Activity Tolerance: Treatment limited secondary to medical complications (Comment) (pt c/o dizziness and nausea. ) Patient left: in bed;with call bell/phone within reach;with family/visitor present Nurse Communication: Mobility status CPM Right Knee CPM Right Knee: Off Right Knee Flexion (Degrees): 60  Right Knee Extension (Degrees): 0  Additional Comments: CPM stopped working at 4 pm. Taken out at 5 pm  GP     Douglas Smolinsky 08/20/2012, 5:32 PM  Shellie Goettl L. Aaric Dolph DPT [phone]

## 2012-08-20 NOTE — Anesthesia Postprocedure Evaluation (Signed)
  Anesthesia Post-op Note  Patient: Karen Owens  Procedure(s) Performed: Procedure(s) (LRB) with comments: TOTAL KNEE ARTHROPLASTY (Right)  Patient Location: PACU  Anesthesia Type:General  Level of Consciousness: awake  Airway and Oxygen Therapy: Patient Spontanous Breathing  Post-op Pain: mild  Post-op Assessment: Post-op Vital signs reviewed  Post-op Vital Signs: Reviewed  Complications: No apparent anesthesia complications

## 2012-08-20 NOTE — Plan of Care (Signed)
Problem: Consults Goal: Diagnosis- Total Joint Replacement Primary Total Knee Right     

## 2012-08-20 NOTE — Transfer of Care (Signed)
Immediate Anesthesia Transfer of Care Note  Patient: Karen Owens  Procedure(s) Performed: Procedure(s) (LRB) with comments: TOTAL KNEE ARTHROPLASTY (Right)  Patient Location: PACU  Anesthesia Type:General  Level of Consciousness: awake, alert  and oriented  Airway & Oxygen Therapy: Patient Spontanous Breathing and Patient connected to nasal cannula oxygen  Post-op Assessment: Report given to PACU RN and Post -op Vital signs reviewed and stable  Post vital signs: Reviewed and stable  Complications: No apparent anesthesia complications

## 2012-08-20 NOTE — Preoperative (Signed)
Beta Blockers   Reason not to administer Beta Blockers:Not Applicable 

## 2012-08-20 NOTE — Anesthesia Preprocedure Evaluation (Signed)
Anesthesia Evaluation  Patient identified by MRN, date of birth, ID band Patient awake    Reviewed: Allergy & Precautions, H&P , NPO status   Airway Mallampati: II      Dental   Pulmonary asthma ,  breath sounds clear to auscultation        Cardiovascular negative cardio ROS  Rhythm:Regular Rate:Normal     Neuro/Psych    GI/Hepatic negative GI ROS, Neg liver ROS,   Endo/Other    Renal/GU negative Renal ROS     Musculoskeletal   Abdominal   Peds  Hematology negative hematology ROS (+)   Anesthesia Other Findings   Reproductive/Obstetrics                           Anesthesia Physical Anesthesia Plan  ASA: III  Anesthesia Plan: General   Post-op Pain Management:    Induction: Intravenous  Airway Management Planned: Oral ETT  Additional Equipment:   Intra-op Plan:   Post-operative Plan: Extubation in OR  Informed Consent: I have reviewed the patients History and Physical, chart, labs and discussed the procedure including the risks, benefits and alternatives for the proposed anesthesia with the patient or authorized representative who has indicated his/her understanding and acceptance.     Plan Discussed with: CRNA, Anesthesiologist and Surgeon  Anesthesia Plan Comments:         Anesthesia Quick Evaluation

## 2012-08-21 ENCOUNTER — Encounter (HOSPITAL_COMMUNITY): Payer: Self-pay | Admitting: General Practice

## 2012-08-21 LAB — BASIC METABOLIC PANEL
BUN: 19 mg/dL (ref 6–23)
CO2: 25 mEq/L (ref 19–32)
Chloride: 102 mEq/L (ref 96–112)
GFR calc Af Amer: 51 mL/min — ABNORMAL LOW (ref >90)
Potassium: 4.3 mEq/L (ref 3.5–5.1)

## 2012-08-21 LAB — CBC
HCT: 29.4 % — ABNORMAL LOW (ref 36.0–46.0)
Platelets: 192 10*3/uL (ref 150–400)
RBC: 3.16 MIL/uL — ABNORMAL LOW (ref 3.87–5.11)
RDW: 12.6 % (ref 11.5–15.5)
WBC: 10.8 10*3/uL — ABNORMAL HIGH (ref 4.0–10.5)

## 2012-08-21 LAB — PROTIME-INR: INR: 1.2 (ref 0.00–1.49)

## 2012-08-21 MED ORDER — WARFARIN SODIUM 7.5 MG PO TABS
7.5000 mg | ORAL_TABLET | Freq: Once | ORAL | Status: AC
  Administered 2012-08-21: 7.5 mg via ORAL
  Filled 2012-08-21: qty 1

## 2012-08-21 NOTE — Progress Notes (Signed)
Subjective: Doing well pain controlled.     Objective: Vital signs in last 24 hours: Temp:  [96.8 F (36 C)-98.6 F (37 C)] 98.3 F (36.8 C) (12/12 0704) Pulse Rate:  [63-90] 88  (12/12 0704) Resp:  [12-22] 18  (12/12 0704) BP: (86-153)/(59-91) 123/65 mmHg (12/12 0704) SpO2:  [90 %-100 %] 90 % (12/12 0704)  Intake/Output from previous day: 12/11 0701 - 12/12 0700 In: 2925 [P.O.:100; I.V.:2825] Out: 1850 [Urine:1600; Drains:250] Intake/Output this shift:     [facility] 08/21/12 0610  HGB 9.9*    Basename 08/21/12 0610  WBC 10.8*  RBC 3.16*  HCT 29.4*  PLT 192    Basename 08/21/12 0610  NA 136  K 4.3  CL 102  CO2 25  BUN 19  CREATININE 1.23*  GLUCOSE 111*  CALCIUM 8.8    Basename 08/21/12 0610  LABPT --  INR 1.20    Exam:  Dressing c/d/i.  Calf nt, nvi.    Assessment/Plan: D/c dilaudid and foley.  Anticipate  d/c home tomorrow.   Darean Rote M 08/21/2012, 9:52 AM

## 2012-08-21 NOTE — Progress Notes (Signed)
ANTICOAGULATION CONSULT NOTE - Follow Up Consult  Pharmacy Consult for Coumadin Indication: VTE prophylaxis (s/p TKA)  No Known Allergies  Patient Measurements: 104 kg  Labs:  [facility] 08/21/12 0610  HGB 9.9*  HCT 29.4*  PLT 192  APTT --  LABPROT 15.0  INR 1.20  HEPARINUNFRC --  CREATININE 1.23*  CKTOTAL --  CKMB --  TROPONINI --    The CrCl is unknown because both a height and weight (above a minimum accepted value) are required for this calculation.   Medical History: Past Medical History  Diagnosis Date  . Complication of anesthesia     difficulty waking up  . Thyroid nodule   . Asthma   . Arthritis   . Refusal of blood transfusions as patient is Jehovah's Witness    Assessment: 71 year old female started on Coumadin for DVT px s/p R TKA Baseline INR = 0.98>1.2 after Coumadin 7.13mtg x1 12/11 Scr stable, CBC stable  Goal of Therapy:  INR 2-3 Monitor platelets by anticoagulation protocol: Yes   Plan:  1) Coumadin 7.5 mg po x 1 dose  2) Daily INR 3) Coumadin education    Leota Sauers Pharm.D. CPP, BCPS Clinical Pharmacist [phone] 08/21/2012 8:50 AM

## 2012-08-21 NOTE — Progress Notes (Signed)
Physical Therapy Treatment Patient Details Name: Karen Owens MRN: [id number] DOB: 1940-11-28 Today's Date: 08/21/2012 Time: 1030-1057 PT Time Calculation (min): 27 min  PT Assessment / Plan / Recommendation Comments on Treatment Session  Patient progressing well with mobility. No complaints of lightheadedness this session, will work on increasing ambulation and therex this afternoon    Follow Up Recommendations  Home health PT;Supervision/Assistance - 24 hour     Does the patient have the potential to tolerate intense rehabilitation     Barriers to Discharge        Equipment Recommendations  None recommended by PT    Recommendations for Other Services    Frequency 7X/week   Plan Discharge plan remains appropriate;Frequency remains appropriate    Precautions / Restrictions Precautions Precautions: Fall;Knee Required Braces or Orthoses: Knee Immobilizer - Right Knee Immobilizer - Right: On except when in CPM Restrictions RLE Weight Bearing: Weight bearing as tolerated   Pertinent Vitals/Pain     Mobility  Bed Mobility Bed Mobility: Not assessed Transfers Transfers: Sit to Stand;Stand to Sit Sit to Stand: 4: Min assist;With upper extremity assist;From toilet;From chair/3-in-1 Stand to Sit: 4: Min assist;With upper extremity assist;To toilet;To chair/3-in-1 Details for Transfer Assistance: Cues for safe technique and hand placement. A for stability Ambulation/Gait Ambulation/Gait Assistance: 4: Min guard Ambulation Distance (Feet): 40 Feet Assistive device: Rolling walker Ambulation/Gait Assistance Details: Cues for gait sequence, RW management and positioning with RW Gait Pattern: Step-through pattern;Decreased step length - right;Decreased step length - left;Decreased stride length;Trunk flexed    Exercises     PT Diagnosis:    PT Problem List:   PT Treatment Interventions:     PT Goals Acute Rehab PT Goals PT Goal: Sit to Stand - Progress: Progressing toward  goal PT Goal: Stand to Sit - Progress: Progressing toward goal PT Goal: Ambulate - Progress: Progressing toward goal PT Goal: Perform Home Exercise Program - Progress: Progressing toward goal  Visit Information  Last PT Received On: 08/21/12 Assistance Needed: +1    Subjective Data      Cognition  Overall Cognitive Status: Appears within functional limits for tasks assessed/performed Arousal/Alertness: Awake/alert Orientation Level: Appears intact for tasks assessed Behavior During Session: [facility] for tasks performed    Balance     End of Session PT - End of Session Equipment Utilized During Treatment: Gait belt Activity Tolerance: Patient tolerated treatment well Patient left: in chair;with call bell/phone within reach Nurse Communication: Mobility status   GP     Fredrich Birks 08/21/2012, 11:03 AM  08/21/2012 Fredrich Birks PTA [phone] pager [phone] office

## 2012-08-21 NOTE — Progress Notes (Signed)
Physical Therapy Treatment Patient Details Name: Karen Owens MRN: [id number] DOB: [birth year] Today's Date: 08/21/2012 Time: 7829-5621 PT Time Calculation (min): 31 min  PT Assessment / Plan / Recommendation Comments on Treatment Session  Patient continues to progress well. Will attempt step in AM and for possible DC home tomorrow.     Follow Up Recommendations  Home health PT;Supervision/Assistance - 24 hour     Does the patient have the potential to tolerate intense rehabilitation     Barriers to Discharge        Equipment Recommendations  None recommended by PT    Recommendations for Other Services    Frequency 7X/week   Plan Discharge plan remains appropriate;Frequency remains appropriate    Precautions / Restrictions Precautions Precautions: Fall Required Braces or Orthoses: Knee Immobilizer - Right Knee Immobilizer - Right: On except when in CPM Restrictions RLE Weight Bearing: Weight bearing as tolerated   Pertinent Vitals/Pain     Mobility  Bed Mobility Bed Mobility: Not assessed Sit to Supine: 4: Min assist Details for Bed Mobility Assistance: A with LEs back into bed. Cues for positioning Transfers Transfers: Sit to Stand;Stand to Sit Sit to Stand: 4: Min guard;With upper extremity assist;From toilet;From chair/3-in-1 Stand to Sit: 4: Min guard;With upper extremity assist;To toilet;To bed Details for Transfer Assistance: Cues for reminder of hand placement Ambulation/Gait Ambulation/Gait Assistance: 4: Min guard Ambulation Distance (Feet): 100 Feet Assistive device: Rolling walker Ambulation/Gait Assistance Details: Cues for posture Gait Pattern: Step-through pattern;Decreased stride length    Exercises Total Joint Exercises Quad Sets: AROM;Right;10 reps Heel Slides: AAROM;Right;10 reps Hip ABduction/ADduction: AAROM;Right;10 reps Straight Leg Raises: AAROM;Right;10 reps   PT Diagnosis:    PT Problem List:   PT Treatment Interventions:     PT  Goals Acute Rehab PT Goals PT Goal: Sit to Supine/Side - Progress: Progressing toward goal PT Goal: Sit to Stand - Progress: Progressing toward goal PT Goal: Stand to Sit - Progress: Progressing toward goal PT Goal: Ambulate - Progress: Progressing toward goal PT Goal: Perform Home Exercise Program - Progress: Progressing toward goal  Visit Information  Last PT Received On: 08/21/12 Assistance Needed: +1    Subjective Data      Cognition  Overall Cognitive Status: Appears within functional limits for tasks assessed/performed Arousal/Alertness: Awake/alert Orientation Level: Appears intact for tasks assessed Behavior During Session: [facility] for tasks performed    Balance     End of Session PT - End of Session Equipment Utilized During Treatment: Gait belt Activity Tolerance: Patient tolerated treatment well Patient left: with call bell/phone within reach;in bed;in CPM Nurse Communication: Mobility status CPM Right Knee CPM Right Knee: On Right Knee Flexion (Degrees): 60  Right Knee Extension (Degrees): 0    GP     Robinette, Adline Potter 08/21/2012, 2:56 PM  08/21/2012 Fredrich Birks PTA [phone] pager [phone] office

## 2012-08-21 NOTE — Progress Notes (Signed)
CARE MANAGEMENT NOTE 08/21/2012  Patient:  Karen Owens, Karen Owens   Account Number:  [id number]  Date Initiated:  08/21/2012  Documentation initiated by:  Vance Peper  Subjective/Objective Assessment:   71 yr old female s/p right total knee arthroplasty.     Action/Plan:   CM spoke with patient concerning home health and DME needs at discharge. Patient states she has rolling walker and 3in1. CPM to be delivered.   Anticipated DC Date:  08/22/2012   Anticipated DC Plan:  HOME W HOME HEALTH SERVICES      DC Planning Services  CM consult      [facility] Choice  HOME HEALTH   Choice offered to / List presented to:  C-1 Patient   DME arranged  CPM      DME agency  [employer]     HH arranged  HH-1 RN  HH-2 PT      HH agency  [facility]   Status of service:  Completed, signed off Medicare Important Message given?   (If response is "NO", the following Medicare IM given date fields will be blank) Date Medicare IM given:   Date Additional Medicare IM given:    Discharge Disposition:  HOME W HOME HEALTH SERVICES  Per UR Regulation:    If discussed at Long Length of Stay Meetings, dates discussed:    Comments:

## 2012-08-21 NOTE — Op Note (Signed)
Karen Owens, Karen Owens                 ACCOUNT NO.:  [id number]  MEDICAL RECORD NO.:  [id number]  LOCATION:  [id number]                        FACILITY:  [facility]  PHYSICIAN:  Loreta Ave, M.D. DATE OF BIRTH:  1941/05/08  DATE OF PROCEDURE:  08/20/2012 DATE OF DISCHARGE:                              OPERATIVE REPORT   PREOPERATIVE DIAGNOSIS:  Right knee end-stage degenerative arthritis, tricompartmental.  POSTOPERATIVE DIAGNOSIS:  Right knee end-stage degenerative arthritis, tricompartmental.  PROCEDURE:  Modified minimally invasive right total knee replacement with Stryker triathlon prosthesis.  Soft tissue balancing.  Cemented pegged posterior stabilized #4 femoral component.  Cemented #4 tibial component, 9 mm insert.  Cemented resurfacing 35-mm patellar component.  SURGEON:  Loreta Ave, MD  ASSISTANT:  Genene Churn. Barry Dienes, [place], present throughout the entire case and necessary for timely completion of procedure.  ANESTHESIA:  General.  BLOOD LOSS:  Minimal.  SPECIMENS:  None.  CULTURES:  None.  COMPLICATION:  None.  DRESSING:  Soft compressive knee immobilizer.  TOURNIQUET TIME:  45 minutes.  DESCRIPTION OF PROCEDURE:  The patient was brought to the operating room, placed on the operating table in supine position.  After adequate anesthesia had been obtained, tourniquet applied in the right leg. Prepped and draped in usual sterile fashion.  Exsanguinated with elevation of Esmarch,  tourniquet inflated to 350 mmHg.  Knee examined. Tricompartmental changes with relatively good alignment.  Good stability.  Minimal flexion contracture, flexion better than 100 degrees.  Anterior incision above the patella down to tibial tubercle. Hemostasis with cautery.  Medial arthrotomy, vastus splitting, preserving quad tendon.  Knee exposed.  Grade 4 change throughout. Remnants of menisci, periarticular spurs, cruciate ligaments, loose bodies removed.  Intramedullary guide distal  femur.  10-mm resection 5 degrees of valgus.  Using epicondylar axis, the femur was sized, cut, and fitted for posterior stabilized pegged #4 component.  Proximal tibial resection extramedullary guide.  A 3-degree posterior slope cut. Size #4 component.  Patella exposed.  Posterior 10 mm removed.  Drilled, sized, and fitted for a 35 mm component.  Trials put in place throughout.  #4 above and below, and a 9 mm insert.  Rotation set with trials.  Excellent biomechanical axis, nicely balanced in flexion extension with good patellofemoral tracking.  Trials removed.  Tibia was hand reamed.  Copious irrigation with pulse irrigating device.  Cement prepared, placed on all components, firmly seated.  Polyethylene attached to tibia, knee reduced.  Patella held with a clamp.  Once the cement hardened, Hemovac was placed and brought through a separate stab wound.  Arthrotomy closed with #1 Vicryl, skin and subcutaneous tissue with Vicryl and staples.  Sterile compressive dressing applied. Tourniquet deflated and removed.  Knee immobilizer applied.  Anesthesia reversed.  Brought to the recovery room.  Tolerated surgery well.  No complications.     Loreta Ave, M.D.     DFM/MEDQ  D:  08/20/2012  T:  08/21/2012  Job:  [id number]

## 2012-08-21 NOTE — Evaluation (Signed)
Occupational Therapy Evaluation Patient Details Name: Karen Owens MRN: [id number] DOB: 10/28/1940 Today's Date: 08/21/2012 Time: 1035-1100 OT Time Calculation (min): 25 min  OT Assessment / Plan / Recommendation Clinical Impression  71 yo female s/p rt TKA that coudl benefit from skilled OT acutely. Recommend HHOT for d/c planning    OT Assessment  Patient needs continued OT Services    Follow Up Recommendations  Home health OT    Barriers to Discharge      Equipment Recommendations  None recommended by OT    Recommendations for Other Services    Frequency  Min 2X/week    Precautions / Restrictions Precautions Precautions: Fall;Knee Required Braces or Orthoses: Knee Immobilizer - Right Knee Immobilizer - Right: On except when in CPM Restrictions RLE Weight Bearing: Weight bearing as tolerated   Pertinent Vitals/Pain Reports pain and seeking medication RN Nettie Elm called and present in the room with medications    ADL  Grooming: Wash/dry hands;Min guard Where Assessed - Grooming: Unsupported standing Toilet Transfer: Minimal assistance Toilet Transfer Method: Sit to Barista: Raised toilet seat with arms (or 3-in-1 over toilet) Toileting - Clothing Manipulation and Hygiene: Minimal assistance Where Assessed - Toileting Clothing Manipulation and Hygiene: Sit to stand from 3-in-1 or toilet Equipment Used: Knee Immobilizer;Gait belt;Rolling walker Transfers/Ambulation Related to ADLs: Pt ambulating Supervision level with good sequence of RW. Pt educated on backing up with RW for toilet transfers. pt required Min v/c for backward ambulation ADL Comments: pt educated on toilet transfer this session and RW use. pt is able to reach ankle on operated side. Pt will have family support for d/c home .    OT Diagnosis: Generalized weakness;Acute pain  OT Problem List: Decreased strength;Impaired balance (sitting and/or standing);Decreased activity  tolerance;Decreased safety awareness;Pain;Decreased knowledge of use of DME or AE OT Treatment Interventions: Self-care/ADL training;DME and/or AE instruction;Therapeutic activities;Patient/family education;Balance training   OT Goals Acute Rehab OT Goals OT Goal Formulation: With patient Time For Goal Achievement: 09/04/12 Potential to Achieve Goals: Good ADL Goals Pt Will Perform Lower Body Dressing: with set-up;Sit to stand from chair ADL Goal: Lower Body Dressing - Progress: Goal set today Pt Will Transfer to Toilet: with modified independence;Ambulation;with DME;3-in-1 ADL Goal: Toilet Transfer - Progress: Goal set today Miscellaneous OT Goals Miscellaneous OT Goal #1: Pt will complete bed mobility Mod I as precursor to adls and basic transfer OT Goal: Miscellaneous Goal #1 - Progress: Goal set today  Visit Information  Last OT Received On: 08/21/12 Assistance Needed: +1 PT/OT Co-Evaluation/Treatment: Yes    Subjective Data  Subjective: "that pill makes me dizzy I rather have the shot"- pt reports side effects with taking oral medication Patient Stated Goal: to go home soon   Prior Functioning     Home Living Lives With: Daughter Available Help at Discharge: Family;Available 24 hours/day Type of Home: House Home Access: Stairs to enter [employer] of Steps: 1 Entrance Stairs-Rails: None Home Layout: One level Bathroom Shower/Tub: Walk-in Contractor: Standard Bathroom Accessibility: Yes How Accessible: Accessible via walker Home Adaptive Equipment: Bedside commode/3-in-1;Walker - rolling;Grab bars in shower Additional Comments: grandson will (A) patient during Christmas holiday break until Dec 21, Prior Function Level of Independence: Independent Able to Take Stairs?: Yes Driving: Yes Vocation: Retired Musician: No difficulties Dominant Hand: Right         Vision/Perception     Cognition  Overall  Cognitive Status: Appears within functional limits for tasks assessed/performed Arousal/Alertness: Awake/alert Orientation Level: Appears intact  for tasks assessed Behavior During Session: [facility] for tasks performed    Extremity/Trunk Assessment Right Upper Extremity Assessment RUE ROM/Strength/Tone: Within functional levels Left Upper Extremity Assessment LUE ROM/Strength/Tone: Within functional levels     Mobility Bed Mobility Bed Mobility: Not assessed Transfers Sit to Stand: 4: Min assist;With upper extremity assist;From toilet;From chair/3-in-1 Stand to Sit: 4: Min assist;With upper extremity assist;To toilet;To chair/3-in-1 Details for Transfer Assistance: Cues for safe technique and hand placement. A for stability     Shoulder Instructions     Exercise     Balance     End of Session OT - End of Session Activity Tolerance: Patient tolerated treatment well Patient left: in chair;with call bell/phone within reach;with family/visitor present Nurse Communication: Mobility status;Precautions  GO     Lucile Shutters 08/21/2012, 2:15 PM Pager: [phone]

## 2012-08-22 ENCOUNTER — Encounter (HOSPITAL_COMMUNITY): Payer: Self-pay | Admitting: Orthopedic Surgery

## 2012-08-22 LAB — BASIC METABOLIC PANEL
CO2: 24 mEq/L (ref 19–32)
Chloride: 107 mEq/L (ref 96–112)
Sodium: 139 mEq/L (ref 135–145)

## 2012-08-22 LAB — CBC
Platelets: 160 10*3/uL (ref 150–400)
RBC: 2.78 MIL/uL — ABNORMAL LOW (ref 3.87–5.11)
WBC: 9.3 10*3/uL (ref 4.0–10.5)

## 2012-08-22 LAB — PROTIME-INR: Prothrombin Time: 16.9 seconds — ABNORMAL HIGH (ref 11.6–15.2)

## 2012-08-22 LAB — HEMOGLOBIN AND HEMATOCRIT, BLOOD: Hemoglobin: 8.3 g/dL — ABNORMAL LOW (ref 12.0–15.0)

## 2012-08-22 MED ORDER — ENOXAPARIN SODIUM 30 MG/0.3ML ~~LOC~~ SOLN: 30.0000 mg | Freq: Two times a day (BID) | SUBCUTANEOUS | Status: DC

## 2012-08-22 MED ORDER — HYDROCODONE-ACETAMINOPHEN 10-325 MG PO TABS: 1.0000 | ORAL_TABLET | ORAL | Status: DC | PRN

## 2012-08-22 MED ORDER — METHOCARBAMOL 500 MG PO TABS: 500.0000 mg | ORAL_TABLET | Freq: Four times a day (QID) | ORAL | Status: DC | PRN

## 2012-08-22 MED ORDER — WARFARIN SODIUM 7.5 MG PO TABS
7.5000 mg | ORAL_TABLET | Freq: Every day | ORAL | Status: AC
  Administered 2012-08-22: 7.5 mg via ORAL
  Filled 2012-08-22 (×2): qty 1

## 2012-08-22 MED ORDER — WARFARIN SODIUM 5 MG PO TABS: 5.0000 mg | ORAL_TABLET | Freq: Every day | ORAL | Status: DC

## 2012-08-22 NOTE — Progress Notes (Signed)
ANTICOAGULATION CONSULT NOTE - Follow Up Consult  Pharmacy Consult for Coumadin Indication: VTE prophylaxis (s/p TKA)  No Known Allergies  Patient Measurements: 104 kg  Labs:  Basename 08/22/12 0527 08/21/12 0610  HGB 8.7* 9.9*  HCT 25.9* 29.4*  PLT 160 192  APTT -- --  LABPROT 16.9* 15.0  INR 1.41 1.20  HEPARINUNFRC -- --  CREATININE 0.87 1.23*  CKTOTAL -- --  CKMB -- --  TROPONINI -- --    Estimated Creatinine Clearance: 74.5 ml/min (by C-G formula based on Cr of 0.87).   Medical History: Past Medical History  Diagnosis Date  . Complication of anesthesia     difficulty waking up  . Thyroid nodule   . Asthma   . Arthritis   . Refusal of blood transfusions as patient is Jehovah's Witness    Assessment: 71 year old female started on Coumadin for DVT px s/p R TKA INR up to 1.41. Pt has gotten 2 doses of coumadin so far.   Goal of Therapy:  INR 2-3 Monitor platelets by anticoagulation protocol: Yes   Plan:  1) Coumadin 7.5 mg po qday 2) Daily INR

## 2012-08-22 NOTE — Progress Notes (Signed)
Physical Therapy Treatment Patient Details Name: Karen Owens MRN: [id number] DOB: 09/07/1941 Today's Date: 08/22/2012 Time: 4098-1191 PT Time Calculation (min): 24 min  PT Assessment / Plan / Recommendation Comments on Treatment Session  Patient continuing to do well. Has gotten up on her own as she says nursing has not been in the room. Educated on not getting up without assistance from family or nursing at this time.     Follow Up Recommendations  Home health PT;Supervision/Assistance - 24 hour     Does the patient have the potential to tolerate intense rehabilitation     Barriers to Discharge        Equipment Recommendations  None recommended by PT    Recommendations for Other Services    Frequency 7X/week   Plan Discharge plan remains appropriate;Frequency remains appropriate    Precautions / Restrictions Precautions Precautions: Knee Required Braces or Orthoses: Knee Immobilizer - Right Knee Immobilizer - Right: On except when in CPM Restrictions RLE Weight Bearing: Weight bearing as tolerated   Pertinent Vitals/Pain     Mobility  Bed Mobility Supine to Sit: 6: Modified independent (Device/Increase time);With rails Sitting - Scoot to Edge of Bed: 5: Supervision Details for Bed Mobility Assistance: increased time Transfers Sit to Stand: 6: Modified independent (Device/Increase time) Stand to Sit: 6: Modified independent (Device/Increase time) Details for Transfer Assistance: Supervision for safety. Patient utilizing safe technique Ambulation/Gait Ambulation/Gait Assistance: 5: Supervision Ambulation Distance (Feet): 200 Feet Assistive device: Rolling walker Ambulation/Gait Assistance Details: Cues for maintaining upright posture Gait Pattern: Step-through pattern;Decreased stride length Gait velocity: decreased Stairs: Yes Stairs Assistance: 4: Min guard Stair Management Technique: Backwards;Sideways;No rails;Step to pattern;With walker Number of Stairs: 1   (practiced twice)    Exercises Total Joint Exercises Quad Sets: AROM;Right;10 reps Short Arc QuadBarbaraann Boys;Right;10 reps Heel Slides: AAROM;Right;10 reps Hip ABduction/ADduction: AAROM;Right;10 reps Straight Leg Raises: AAROM;Right;10 reps   PT Diagnosis:    PT Problem List:   PT Treatment Interventions:     PT Goals Acute Rehab PT Goals PT Goal: Supine/Side to Sit - Progress: Progressing toward goal PT Goal: Sit to Stand - Progress: Progressing toward goal PT Goal: Stand to Sit - Progress: Progressing toward goal PT Goal: Ambulate - Progress: Progressing toward goal PT Goal: Up/Down Stairs - Progress: Met PT Goal: Perform Home Exercise Program - Progress: Progressing toward goal  Visit Information  Last PT Received On: 08/22/12 Assistance Needed: +1    Subjective Data      Cognition  Overall Cognitive Status: Appears within functional limits for tasks assessed/performed Arousal/Alertness: Awake/alert Orientation Level: Appears intact for tasks assessed Behavior During Session: [facility] for tasks performed    Balance     End of Session PT - End of Session Equipment Utilized During Treatment: Gait belt Activity Tolerance: Patient tolerated treatment well Patient left: in chair;with call bell/phone within reach Nurse Communication: Mobility status   GP     Fredrich Birks 08/22/2012, 11:54 AM 08/22/2012 Fredrich Birks PTA [phone] pager [phone] office

## 2012-08-22 NOTE — Progress Notes (Signed)
Occupational Therapy Treatment Patient Details Name: Karen Owens MRN: [id number] DOB: [birth year] Today's Date: 08/22/2012 Time: 1340-1410 OT Time Calculation (min): 30 min  OT Assessment / Plan / Recommendation Comments on Treatment Session PT is at adequate level for d/c home and OT goals addressed.    Follow Up Recommendations  Home health OT    Barriers to Discharge       Equipment Recommendations  None recommended by OT    Recommendations for Other Services    Frequency Min 2X/week   Plan Discharge plan remains appropriate    Precautions / Restrictions Precautions Precautions: Knee Required Braces or Orthoses: Knee Immobilizer - Right Knee Immobilizer - Right: On except when in CPM Restrictions RLE Weight Bearing: Weight bearing as tolerated   Pertinent Vitals/Pain Minimal pain reported Ice applied CPM 0-55   ADL  Eating/Feeding: Modified independent Where Assessed - Eating/Feeding: Chair Grooming: Simulated Where Assessed - Grooming: Unsupported sitting Toilet Transfer: Supervision/safety Toilet Transfer Method: Sit to Barista: Raised toilet seat with arms (or 3-in-1 over toilet) Tub/Shower Transfer: Min guard Tub/Shower Transfer Method: Stand pivot;Ambulating Tub/Shower Transfer Equipment: Walk in shower Equipment Used: Knee Immobilizer;Gait belt;Rolling walker Transfers/Ambulation Related to ADLs: Pt ambulating Supervision level with good sequence of RW.min v/c for hand placement ADL Comments: Pt will have family assist for LB dressing. Pt demonstrated bed mobility with increased time. Pt educated on shower transfer and good return demo. pt placed in CPM 0-55 @ 1410 ice applied for edema management    OT Diagnosis:    OT Problem List:   OT Treatment Interventions:     OT Goals Acute Rehab OT Goals OT Goal Formulation: With patient Time For Goal Achievement: 09/04/12 Potential to Achieve Goals: Good ADL Goals Pt Will Perform Lower  Body Dressing: with set-up;Sit to stand from chair ADL Goal: Lower Body Dressing - Progress: Met Pt Will Transfer to Toilet: with modified independence;Ambulation;with DME;3-in-1 ADL Goal: Toilet Transfer - Progress: Progressing toward goals Miscellaneous OT Goals Miscellaneous OT Goal #1: Pt will complete bed mobility Mod I as precursor to adls and basic transfer OT Goal: Miscellaneous Goal #1 - Progress: Progressing toward goals  Visit Information  Last OT Received On: 08/22/12 Assistance Needed: +1    Subjective Data  Subjective: "I will have someone there at all times to help me" Patient Stated Goal: to home today if they will let me    Prior Functioning  Home Living Lives With: Daughter Available Help at Discharge: Family;Available 24 hours/day Type of Home: House Home Access: Stairs to enter [employer] of Steps: 1 Entrance Stairs-Rails: None Home Layout: One level Bathroom Shower/Tub: Walk-in Contractor: Standard Bathroom Accessibility: Yes How Accessible: Accessible via walker Home Adaptive Equipment: Bedside commode/3-in-1;Walker - rolling;Grab bars in shower    Cognition  Overall Cognitive Status: Appears within functional limits for tasks assessed/performed Arousal/Alertness: Awake/alert Orientation Level: Appears intact for tasks assessed Behavior During Session: [facility] for tasks performed    Mobility  Shoulder Instructions Bed Mobility Supine to Sit: 6: Modified independent (Device/Increase time);HOB flat Sitting - Scoot to Edge of Bed: 6: Modified independent (Device/Increase time) Sit to Supine: 6: Modified independent (Device/Increase time) Details for Bed Mobility Assistance: edcuated on using rolled up bed sheet for (A) with RT LE lifting Transfers Sit to Stand: 5: Supervision;With upper extremity assist;From bed Stand to Sit: 5: Supervision;With upper extremity assist;To bed Details for Transfer Assistance: vc for hand  placement attempting to pull up on RW  Exercises      Balance     End of Session OT - End of Session Activity Tolerance: Patient tolerated treatment well Patient left: in bed;with call bell/phone within reach (in CPM) Nurse Communication: Mobility status;Precautions CPM Right Knee CPM Right Knee: On Right Knee Flexion (Degrees): 55  Right Knee Extension (Degrees): 0   GO     Harrel Carina [place] 08/22/2012, 3:24 PM Pager: [phone]

## 2012-08-22 NOTE — Progress Notes (Signed)
Subjective: Doing well.  Pain controlled.  Denies feeling fatigued, lightheaded.  Did well with therapy.  Wants to go home.  No bm yet.     Objective: Vital signs in last 24 hours: Temp:  [97.6 F (36.4 C)-99.3 F (37.4 C)] 97.6 F (36.4 C) (12/13 0606) Pulse Rate:  [70-80] 70  (12/13 0606) Resp:  [16-18] 18  (12/13 0606) BP: (106-125)/(56-79) 125/79 mmHg (12/13 0606) SpO2:  [98 %-99 %] 98 % (12/13 0606) Weight:  [104.327 kg (230 lb)] 104.327 kg (230 lb) (12/13 0900)  Intake/Output from previous day: 12/12 0701 - 12/13 0700 In: 990 [P.O.:270; I.V.:720] Out: 220 [Drains:220] Intake/Output this shift: Total I/O In: 120 [P.O.:120] Out: -    Basename 08/22/12 1310 08/22/12 0527 08/21/12 0610  HGB 8.3* 8.7* 9.9*    Basename 08/22/12 1310 08/22/12 0527 08/21/12 0610  WBC -- 9.3 10.8*  RBC -- 2.78* 3.16*  HCT 24.2* 25.9* --  PLT -- 160 192    Basename 08/22/12 0527 08/21/12 0610  NA 139 136  K 3.9 4.3  CL 107 102  CO2 24 25  BUN 13 19  CREATININE 0.87 1.23*  GLUCOSE 104* 111*  CALCIUM 8.8 8.8    Basename 08/22/12 0527 08/21/12 0610  LABPT -- --  INR 1.41 1.20    Exam:  Wound looks good.  Staples intact.  No signs of infection.  Drain removed.  Calf nt, nvi.    Assessment/Plan: -asymptomatic ABLA.  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  D/c home today after has a bowel movement.  F/u as scheduled.   Karen Owens M 08/22/2012, 1:54 PM

## 2012-08-22 NOTE — Progress Notes (Signed)
Physical Therapy Treatment Patient Details Name: Karen Owens MRN: [id number] DOB: July 25, 1941 Today's Date: 08/22/2012 Time: 4098-1191 PT Time Calculation (min): 25 min  PT Assessment / Plan / Recommendation Comments on Treatment Session  Patient progressing well with ambulation and stair training. Is awaiting blood level to determine if she will discharge later this afternoon    Follow Up Recommendations  Home health PT;Supervision/Assistance - 24 hour     Does the patient have the potential to tolerate intense rehabilitation     Barriers to Discharge        Equipment Recommendations  None recommended by PT    Recommendations for Other Services    Frequency 7X/week   Plan Discharge plan remains appropriate;Frequency remains appropriate    Precautions / Restrictions Precautions Precautions: Fall Required Braces or Orthoses: Knee Immobilizer - Right Knee Immobilizer - Right: On except when in CPM Restrictions RLE Weight Bearing: Weight bearing as tolerated   Pertinent Vitals/Pain     Mobility  Bed Mobility Supine to Sit: 5: Supervision Sitting - Scoot to Edge of Bed: 5: Supervision Transfers Sit to Stand: 5: Supervision;With upper extremity assist;From bed;From toilet Stand to Sit: 5: Supervision;With upper extremity assist;To toilet;To chair/3-in-1 Details for Transfer Assistance: Supervision for safety. Patient utilizing safe technique Ambulation/Gait Ambulation/Gait Assistance: 4: Min guard Ambulation Distance (Feet): 180 Feet Assistive device: Rolling walker Ambulation/Gait Assistance Details: Cues for posture.  Gait Pattern: Step-through pattern;Decreased stride length;Trunk flexed Stairs: Yes Stairs Assistance: 4: Min guard Stair Management Technique: Backwards;Sideways;No rails;Step to pattern;With walker Number of Stairs: 1  (practiced twice)    Exercises Total Joint Exercises Quad Sets: AROM;Right;10 reps Short Arc QuadBarbaraann Boys;Right;10 reps Heel Slides:  AAROM;Right;10 reps Hip ABduction/ADduction: AAROM;Right;10 reps Straight Leg Raises: AAROM;Right;10 reps   PT Diagnosis:    PT Problem List:   PT Treatment Interventions:     PT Goals Acute Rehab PT Goals PT Goal: Supine/Side to Sit - Progress: Progressing toward goal PT Goal: Sit to Stand - Progress: Met PT Goal: Stand to Sit - Progress: Met PT Goal: Ambulate - Progress: Progressing toward goal PT Goal: Up/Down Stairs - Progress: Met PT Goal: Perform Home Exercise Program - Progress: Progressing toward goal  Visit Information  Last PT Received On: 08/22/12 Assistance Needed: +1    Subjective Data      Cognition  Overall Cognitive Status: Appears within functional limits for tasks assessed/performed Arousal/Alertness: Awake/alert Orientation Level: Appears intact for tasks assessed Behavior During Session: [facility] for tasks performed    Balance     End of Session PT - End of Session Equipment Utilized During Treatment: Gait belt;Right knee immobilizer Activity Tolerance: Patient tolerated treatment well Patient left: in chair;with call bell/phone within reach Nurse Communication: Mobility status   GP     Fredrich Birks 08/22/2012, 10:44 AM  08/22/2012 Fredrich Birks PTA [phone] pager [phone] office

## 2012-08-23 LAB — BASIC METABOLIC PANEL
BUN: 9 mg/dL (ref 6–23)
Creatinine, Ser: 0.84 mg/dL (ref 0.50–1.10)
GFR calc Af Amer: 80 mL/min — ABNORMAL LOW (ref >90)
GFR calc non Af Amer: 69 mL/min — ABNORMAL LOW (ref >90)
Glucose, Bld: 97 mg/dL (ref 70–99)

## 2012-08-23 LAB — CBC
HCT: 26.1 % — ABNORMAL LOW (ref 36.0–46.0)
MCH: 31.2 pg (ref 26.0–34.0)
MCHC: 33.7 g/dL (ref 30.0–36.0)
MCV: 92.6 fL (ref 78.0–100.0)
RDW: 12.9 % (ref 11.5–15.5)

## 2012-08-23 LAB — PROTIME-INR: INR: 1.41 (ref 0.00–1.49)

## 2012-08-23 MED ORDER — WARFARIN SODIUM 10 MG PO TABS
10.0000 mg | ORAL_TABLET | Freq: Once | ORAL | Status: DC
  Filled 2012-08-23: qty 1

## 2012-08-23 NOTE — Progress Notes (Signed)
ANTICOAGULATION CONSULT NOTE - Follow Up Consult  Pharmacy Consult for Coumadin Indication: VTE prophylaxis (s/p TKA)  No Known Allergies  Patient Measurements: 104 kg  Labs:  Basename 08/23/12 0510 08/22/12 1310 08/22/12 0527 08/21/12 0610  HGB 8.8* 8.3* -- --  HCT 26.1* 24.2* 25.9* --  PLT 181 -- 160 192  APTT -- -- -- --  LABPROT 16.9* -- 16.9* 15.0  INR 1.41 -- 1.41 1.20  HEPARINUNFRC -- -- -- --  CREATININE 0.84 -- 0.87 1.23*  CKTOTAL -- -- -- --  CKMB -- -- -- --  TROPONINI -- -- -- --    Estimated Creatinine Clearance: 77.1 ml/min (by C-G formula based on Cr of 0.84).   Medical History: Past Medical History  Diagnosis Date  . Complication of anesthesia     difficulty waking up  . Thyroid nodule   . Asthma   . Arthritis   . Refusal of blood transfusions as patient is Jehovah's Witness    Assessment: 71 year old female started on Coumadin for DVT px s/p R TKA INR remains at 1.41. Pt has received 3 doses of coumadin 7.5mg  so far.   Goal of Therapy:  INR 2-3 Monitor platelets by anticoagulation protocol: Yes   Plan:  1) Coumadin 10 mg po today 2) Daily INR

## 2012-08-23 NOTE — Progress Notes (Signed)
Physical Therapy Treatment Patient Details Name: Karen Owens MRN: [id number] DOB: 20-Dec-1940 Today's Date: 08/23/2012 Time: 1025-1050 PT Time Calculation (min): 25 min  PT Assessment / Plan / Recommendation Comments on Treatment Session  patient planning to discharge today.  Patient progressing well with mobility and strength/ROM on right knee.  Patient modified independent for all mobility.  Will need continued focus to progress to cane and to address strength and ROM deficits.  Recommend f/u HHPT.    Follow Up Recommendations  Home health PT;Supervision - Intermittent     Does the patient have the potential to tolerate intense rehabilitation     Barriers to Discharge        Equipment Recommendations  None recommended by PT    Recommendations for Other Services    Frequency     Plan Discharge plan remains appropriate;Frequency remains appropriate    Precautions / Restrictions Restrictions Weight Bearing Restrictions: Yes RLE Weight Bearing: Weight bearing as tolerated   Pertinent Vitals/Pain No significant pain    Mobility  Bed Mobility Bed Mobility: Supine to Sit Supine to Sit: 6: Modified independent (Device/Increase time);With rails;HOB flat Sitting - Scoot to Edge of Bed: 7: Independent Transfers Transfers: Sit to Stand;Stand to Sit Sit to Stand: 6: Modified independent (Device/Increase time);From bed;With upper extremity assist Stand to Sit: 6: Modified independent (Device/Increase time);To chair/3-in-1;With upper extremity assist Ambulation/Gait Ambulation/Gait Assistance: 6: Modified independent (Device/Increase time) Ambulation Distance (Feet): 100 Feet Assistive device: Rolling walker Ambulation/Gait Assistance Details: tried ambulating without knee immobilizer - patient felt that knee buckled.  Advised patient to continue to wear knee immobilizer at home. Gait Pattern: Step-through pattern;Decreased stride length    Exercises Total Joint Exercises Ankle  Circles/Pumps: AROM;Both;10 reps;Supine Quad Sets: AROM;Both;10 reps;Supine Short Arc Quad: Right;10 reps;Supine;AAROM Heel Slides: AAROM;Right;10 reps;Supine Hip ABduction/ADduction: AROM;Right;10 reps;Supine Straight Leg Raises: AAROM;Right;10 reps;Supine Long Arc Quad: AROM;Right;10 reps;Seated Knee Flexion: AAROM;Right;10 reps;Seated Goniometric ROM: knee flex = 69   PT Diagnosis:    PT Problem List:   PT Treatment Interventions:     PT Goals Acute Rehab PT Goals PT Goal: Supine/Side to Sit - Progress: Met PT Goal: Sit to Stand - Progress: Met PT Goal: Stand to Sit - Progress: Met PT Goal: Ambulate - Progress: Met PT Goal: Perform Home Exercise Program - Progress: Progressing toward goal  Visit Information  Last PT Received On: 08/23/12 Assistance Needed: +1    Subjective Data  Subjective: patient reports she is discharging today   Cognition  Overall Cognitive Status: Appears within functional limits for tasks assessed/performed Arousal/Alertness: Awake/alert Orientation Level: Appears intact for tasks assessed Behavior During Session: [facility] for tasks performed    Balance  Balance Balance Assessed: No (no obvious balance deficits noted)  End of Session PT - End of Session Activity Tolerance: Patient tolerated treatment well Patient left: in chair;with family/visitor present;with call bell/phone within reach   GP     Olivia Canter, [place] [phone] 08/23/2012, 11:00 AM

## 2012-08-23 NOTE — Progress Notes (Signed)
Patient ID: Karen Owens, female   DOB: 07-Dec-1940, 71 y.o.   MRN: [id number]     Subjective:  Patient reports pain as mild to moderate.  Pain is well controlled and she states that she is ready to go home.  Objective:   VITALS:   Filed Vitals:   08/22/12 2200 08/23/12 0554 08/23/12 0800 08/23/12 1121  BP: 129/57 128/61    Pulse: 73 75    Temp: 98.6 F (37 C) 99.2 F (37.3 C)    TempSrc:      Resp: 18 18 18 18   Height:      Weight:      SpO2: 100% 100% 100% 100%    ABD soft Sensation intact distally Dorsiflexion/Plantar flexion intact Incision: scant drainage Wound clean and dry and dry dressing applied  LABS  Results for orders placed during the hospital encounter of 08/20/12 (from the past 24 hour(s))  HEMOGLOBIN AND HEMATOCRIT, BLOOD     Status: Abnormal   Collection Time   08/22/12  1:10 PM      Component Value Range   Hemoglobin 8.3 (*) 12.0 - 15.0 g/dL   HCT 14.7 (*) 82.9 - 56.2 %  PROTIME-INR     Status: Abnormal   Collection Time   08/23/12  5:10 AM      Component Value Range   Prothrombin Time 16.9 (*) 11.6 - 15.2 seconds   INR 1.41  0.00 - 1.49  CBC     Status: Abnormal   Collection Time   08/23/12  5:10 AM      Component Value Range   WBC 10.1  4.0 - 10.5 K/uL   RBC 2.82 (*) 3.87 - 5.11 MIL/uL   Hemoglobin 8.8 (*) 12.0 - 15.0 g/dL   HCT 13.0 (*) 86.5 - 78.4 %   MCV 92.6  78.0 - 100.0 fL   MCH 31.2  26.0 - 34.0 pg   MCHC 33.7  30.0 - 36.0 g/dL   RDW 69.6  29.5 - 28.4 %   Platelets 181  150 - 400 K/uL  BASIC METABOLIC PANEL     Status: Abnormal   Collection Time   08/23/12  5:10 AM      Component Value Range   Sodium 140  135 - 145 mEq/L   Potassium 3.7  3.5 - 5.1 mEq/L   Chloride 106  96 - 112 mEq/L   CO2 26  19 - 32 mEq/L   Glucose, Bld 97  70 - 99 mg/dL   BUN 9  6 - 23 mg/dL   Creatinine, Ser 1.32  0.50 - 1.10 mg/dL   Calcium 9.2  8.4 - 44.0 mg/dL   GFR calc non Af Amer 69 (*) >90 mL/min   GFR calc Af Amer 80 (*) >90 mL/min    No  results found.  Assessment/Plan: 3 Days Post-Op   S/p R TKA  ABLA observed, improving  Advance diet Up with therapy Discharge home with home health per Dr Eulah Pont and Zonia Kief orders.   Haskel Khan 08/23/2012, 11:40 AM   Teryl Lucy, MD Cell [phone] Pager [phone]

## 2012-09-08 NOTE — Discharge Summary (Signed)
Physician Discharge Summary  Patient ID: Karen Owens MRN: [id number] DOB/AGE: 69-Oct-1942 69 y.o.  Admit date: 08/20/2012 Discharge date: 09/08/2012  Admission Diagnoses:  Right knee djd  Discharge Diagnoses:  Right total knee replacement  Discharged Condition: good  Hospital Course: 71 yo bf with hx of end stage djd right knee and pain was taken to the OR for scheduled total knee replacement.  Tolerated procedure and anesthesia well without complication.  Transferred to ortho unit and protocol dvt prophylaxis started.  12 dec, doing well.  No issues.  Started therapy.  13 dec, dressing changed.  Wound looked good.  No signs of infection.  Drain removed.  Calf nt, nvi.  14 dec, patient doing well and ready for discharge home.  Knee wound looked good.  Staples intact. No drainage or signs of infection.  Calf nt, nvi.   Consults: None   Discharge Exam: Blood pressure 117/67, pulse 90, temperature 98.7 F (37.1 C), temperature source Oral, resp. rate 18, height 5\' 6"  (1.676 m), weight 104.327 kg (230 lb), SpO2 100.00%.   Disposition: 06-Home-Health Care Svc  Discharge Orders    Future Orders Please Complete By Expires   Diet - low sodium heart healthy      Call MD / Call 911      Comments:   If you experience chest pain or shortness of breath, CALL 911 and be transported to the hospital emergency room.  If you develope a fever above 101 F, pus (white drainage) or increased drainage or redness at the wound, or calf pain, call your surgeon's office.   Constipation Prevention      Comments:   Drink plenty of fluids.  Prune juice may be helpful.  You may use a stool softener, such as Colace (over the counter) 100 mg twice a day.  Use MiraLax (over the counter) for constipation as needed.   Increase activity slowly as tolerated      Discharge instructions      Comments:   Ok to shower, but no tub soaking.  Do not apply any creams or ointments to incision.  Continue physical therapy  protocol.  Scripts for norco 10/325, robaxin 500mg  1 po q6 hrs prn spasms, coumadin pharmacy protocol ([facility] WILL DOSE DAILY), and lovenox 30mg  1 sq injection q 12hrs.  Stop lovenox when coumadin therapeutic with INR 2-3.  SCRIPTS FOR LOVENOX, COUMADIN AND ROBAXIN CALLED IN TO YOUR [facility] AND MUST BE PICKED UP TODAY.   Driving restrictions      Comments:   No driving until further notice.   CPM      Comments:   Continuous passive motion machine (CPM):      Use the CPM from 0 to 70 degrees for 6-8 hours per day.      You may increase by 10 degrees per day as tolerated.  You may break it up into 2 or 3 sessions per day.      Use CPM for 3-4 weeks or until you are told to stop.   TED hose      Comments:   Use stockings (TED hose) for 3-4 weeks on both leg(s).  You may remove them at night for sleeping.   Change dressing      Comments:   Change dressing on right knee daily with sterile 4 x 4 inch gauze dressing and apply TED hose.   Do not put a pillow under the knee. Place it under the heel.  Medication List     As of 09/08/2012  4:21 PM    STOP taking these medications         amoxicillin 500 MG capsule   Commonly known as: AMOXIL      aspirin EC 81 MG tablet      multivitamin with minerals Tabs      TAKE these medications         albuterol 108 (90 BASE) MCG/ACT inhaler   Commonly known as: PROVENTIL HFA;VENTOLIN HFA   Inhale 2 puffs into the lungs every 6 (six) hours as needed.      enoxaparin 30 MG/0.3ML injection   Commonly known as: LOVENOX   Inject 0.3 mLs (30 mg total) into the skin every 12 (twelve) hours.      HYDROcodone-acetaminophen 10-325 MG per tablet   Commonly known as: NORCO   Take 1-2 tablets by mouth every 4 (four) hours as needed for pain (breakthrough pain).      methocarbamol 500 MG tablet   Commonly known as: ROBAXIN   Take 1 tablet (500 mg total) by mouth every 6 (six) hours as needed (spasms).      MOTION SICKNESS  PO   Take 1 tablet by mouth daily as needed. For dizziness.      warfarin 5 MG tablet   Commonly known as: COUMADIN   Take 1 tablet (5 mg total) by mouth daily.           Follow-up Information    Schedule an appointment as soon as possible for a visit with Loreta Ave, MD. (need return office visit 2 weeks postop)    Contact information:   [address] [place] [address] [phone]          Signed: Naida Sleight 09/08/2012, 4:21 PM

## 2012-09-23 ENCOUNTER — Ambulatory Visit: Payer: PRIVATE HEALTH INSURANCE | Attending: Orthopedic Surgery | Admitting: Physical Therapy

## 2012-09-23 DIAGNOSIS — IMO0001 Reserved for inherently not codable concepts without codable children: Secondary | ICD-10-CM | POA: Insufficient documentation

## 2012-09-23 DIAGNOSIS — M25569 Pain in unspecified knee: Secondary | ICD-10-CM | POA: Insufficient documentation

## 2012-09-23 DIAGNOSIS — M25669 Stiffness of unspecified knee, not elsewhere classified: Secondary | ICD-10-CM | POA: Insufficient documentation

## 2012-09-23 DIAGNOSIS — Z96659 Presence of unspecified artificial knee joint: Secondary | ICD-10-CM | POA: Insufficient documentation

## 2012-09-25 ENCOUNTER — Ambulatory Visit: Payer: PRIVATE HEALTH INSURANCE | Admitting: Physical Therapy

## 2012-09-26 ENCOUNTER — Ambulatory Visit: Payer: PRIVATE HEALTH INSURANCE | Admitting: Physical Therapy

## 2012-09-29 ENCOUNTER — Ambulatory Visit: Payer: PRIVATE HEALTH INSURANCE | Admitting: Physical Therapy

## 2012-09-30 ENCOUNTER — Ambulatory Visit: Payer: PRIVATE HEALTH INSURANCE | Admitting: Physical Therapy

## 2012-10-02 ENCOUNTER — Ambulatory Visit: Payer: PRIVATE HEALTH INSURANCE | Admitting: Physical Therapy

## 2012-10-06 ENCOUNTER — Ambulatory Visit: Payer: PRIVATE HEALTH INSURANCE | Admitting: Physical Therapy

## 2012-10-09 ENCOUNTER — Ambulatory Visit: Payer: PRIVATE HEALTH INSURANCE | Admitting: Physical Therapy

## 2012-10-14 ENCOUNTER — Ambulatory Visit: Payer: Medicare Other | Attending: Orthopedic Surgery | Admitting: Physical Therapy

## 2012-10-14 DIAGNOSIS — M25669 Stiffness of unspecified knee, not elsewhere classified: Secondary | ICD-10-CM | POA: Insufficient documentation

## 2012-10-14 DIAGNOSIS — Z96659 Presence of unspecified artificial knee joint: Secondary | ICD-10-CM | POA: Insufficient documentation

## 2012-10-14 DIAGNOSIS — IMO0001 Reserved for inherently not codable concepts without codable children: Secondary | ICD-10-CM | POA: Insufficient documentation

## 2012-10-14 DIAGNOSIS — M25569 Pain in unspecified knee: Secondary | ICD-10-CM | POA: Insufficient documentation

## 2012-10-16 ENCOUNTER — Ambulatory Visit: Payer: Medicare Other | Admitting: Physical Therapy

## 2012-10-21 ENCOUNTER — Ambulatory Visit: Payer: Medicare Other | Admitting: Physical Therapy

## 2012-10-23 ENCOUNTER — Ambulatory Visit: Payer: Medicare Other | Admitting: Physical Therapy

## 2012-10-28 ENCOUNTER — Ambulatory Visit: Payer: Medicare Other | Admitting: Physical Therapy

## 2012-10-30 ENCOUNTER — Encounter: Payer: PRIVATE HEALTH INSURANCE | Admitting: Physical Therapy

## 2012-11-04 ENCOUNTER — Encounter: Payer: PRIVATE HEALTH INSURANCE | Admitting: Physical Therapy

## 2012-11-05 ENCOUNTER — Other Ambulatory Visit: Payer: Self-pay | Admitting: Internal Medicine

## 2012-11-05 ENCOUNTER — Ambulatory Visit
Admission: RE | Admit: 2012-11-05 | Discharge: 2012-11-05 | Disposition: A | Discharge status: Home / Self Care | Payer: Medicare Other | Source: Ambulatory Visit | Attending: Internal Medicine | Admitting: Internal Medicine

## 2012-11-06 ENCOUNTER — Encounter: Payer: PRIVATE HEALTH INSURANCE | Admitting: Physical Therapy

## 2012-11-06 ENCOUNTER — Other Ambulatory Visit: Payer: Self-pay | Admitting: Orthopedic Surgery

## 2012-11-06 DIAGNOSIS — R609 Edema, unspecified: Secondary | ICD-10-CM

## 2018-06-06 ENCOUNTER — Inpatient Hospital Stay (HOSPITAL_COMMUNITY)
Admission: EM | Admit: 2018-06-06 | Discharge: 2018-06-10 | DRG: 287 | Disposition: A | Discharge status: Home / Self Care | Payer: Medicare Other | Attending: Cardiology | Admitting: Cardiology

## 2018-06-06 ENCOUNTER — Other Ambulatory Visit: Payer: Self-pay

## 2018-06-06 ENCOUNTER — Encounter (HOSPITAL_COMMUNITY): Payer: Self-pay

## 2018-06-06 ENCOUNTER — Emergency Department (HOSPITAL_COMMUNITY): Payer: Medicare Other

## 2018-06-06 DIAGNOSIS — I214 Non-ST elevation (NSTEMI) myocardial infarction: Secondary | ICD-10-CM

## 2018-06-06 DIAGNOSIS — E785 Hyperlipidemia, unspecified: Secondary | ICD-10-CM

## 2018-06-06 DIAGNOSIS — R030 Elevated blood-pressure reading, without diagnosis of hypertension: Secondary | ICD-10-CM | POA: Diagnosis present

## 2018-06-06 DIAGNOSIS — I82409 Acute embolism and thrombosis of unspecified deep veins of unspecified lower extremity: Secondary | ICD-10-CM

## 2018-06-06 DIAGNOSIS — Z96653 Presence of artificial knee joint, bilateral: Secondary | ICD-10-CM | POA: Diagnosis present

## 2018-06-06 DIAGNOSIS — I201 Angina pectoris with documented spasm: Principal | ICD-10-CM | POA: Diagnosis present

## 2018-06-06 DIAGNOSIS — E7800 Pure hypercholesterolemia, unspecified: Secondary | ICD-10-CM | POA: Diagnosis not present

## 2018-06-06 DIAGNOSIS — R7989 Other specified abnormal findings of blood chemistry: Secondary | ICD-10-CM | POA: Diagnosis not present

## 2018-06-06 DIAGNOSIS — Z888 Allergy status to other drugs, medicaments and biological substances status: Secondary | ICD-10-CM

## 2018-06-06 DIAGNOSIS — I248 Other forms of acute ischemic heart disease: Secondary | ICD-10-CM | POA: Diagnosis present

## 2018-06-06 DIAGNOSIS — J45909 Unspecified asthma, uncomplicated: Secondary | ICD-10-CM | POA: Diagnosis present

## 2018-06-06 DIAGNOSIS — M1990 Unspecified osteoarthritis, unspecified site: Secondary | ICD-10-CM | POA: Diagnosis present

## 2018-06-06 DIAGNOSIS — Z7901 Long term (current) use of anticoagulants: Secondary | ICD-10-CM | POA: Diagnosis not present

## 2018-06-06 DIAGNOSIS — I82412 Acute embolism and thrombosis of left femoral vein: Secondary | ICD-10-CM | POA: Diagnosis present

## 2018-06-06 DIAGNOSIS — R079 Chest pain, unspecified: Secondary | ICD-10-CM | POA: Diagnosis present

## 2018-06-06 DIAGNOSIS — R072 Precordial pain: Secondary | ICD-10-CM | POA: Diagnosis not present

## 2018-06-06 DIAGNOSIS — I82432 Acute embolism and thrombosis of left popliteal vein: Secondary | ICD-10-CM | POA: Diagnosis present

## 2018-06-06 DIAGNOSIS — I252 Old myocardial infarction: Secondary | ICD-10-CM | POA: Diagnosis present

## 2018-06-06 DIAGNOSIS — I2699 Other pulmonary embolism without acute cor pulmonale: Secondary | ICD-10-CM

## 2018-06-06 DIAGNOSIS — I071 Rheumatic tricuspid insufficiency: Secondary | ICD-10-CM | POA: Diagnosis present

## 2018-06-06 DIAGNOSIS — Z87891 Personal history of nicotine dependence: Secondary | ICD-10-CM

## 2018-06-06 DIAGNOSIS — I2721 Secondary pulmonary arterial hypertension: Secondary | ICD-10-CM | POA: Diagnosis not present

## 2018-06-06 DIAGNOSIS — I361 Nonrheumatic tricuspid (valve) insufficiency: Secondary | ICD-10-CM | POA: Diagnosis not present

## 2018-06-06 DIAGNOSIS — Z9289 Personal history of other medical treatment: Secondary | ICD-10-CM

## 2018-06-06 LAB — CBC
HCT: 38.8 % (ref 36.0–46.0)
Hemoglobin: 12.5 g/dL (ref 12.0–15.0)
MCH: 31.3 pg (ref 26.0–34.0)
MCHC: 32.2 g/dL (ref 30.0–36.0)
MCV: 97.2 fL (ref 78.0–100.0)
PLATELETS: 159 10*3/uL (ref 150–400)
RBC: 3.99 MIL/uL (ref 3.87–5.11)
RDW: 12.6 % (ref 11.5–15.5)
WBC: 13.9 10*3/uL — AB (ref 4.0–10.5)

## 2018-06-06 LAB — BASIC METABOLIC PANEL
Anion gap: 11 (ref 5–15)
BUN: 16 mg/dL (ref 8–23)
CALCIUM: 10.4 mg/dL — AB (ref 8.9–10.3)
CO2: 23 mmol/L (ref 22–32)
CREATININE: 0.9 mg/dL (ref 0.44–1.00)
Chloride: 108 mmol/L (ref 98–111)
GFR calc Af Amer: >60 mL/min (ref >60)
GFR calc non Af Amer: >60 mL/min (ref >60)
Glucose, Bld: 121 mg/dL — ABNORMAL HIGH (ref 70–99)
Potassium: 3.4 mmol/L — ABNORMAL LOW (ref 3.5–5.1)
SODIUM: 142 mmol/L (ref 135–145)

## 2018-06-06 LAB — I-STAT TROPONIN, ED: Troponin i, poc: 0.21 ng/mL (ref 0.00–0.08)

## 2018-06-06 LAB — TROPONIN I: Troponin I: 0.35 ng/mL (ref <0.03)

## 2018-06-06 MED ORDER — NITROGLYCERIN 0.4 MG SL SUBL: 0.4000 mg | SUBLINGUAL_TABLET | SUBLINGUAL | Status: DC | PRN

## 2018-06-06 MED ORDER — ASPIRIN EC 81 MG PO TBEC
81.0000 mg | DELAYED_RELEASE_TABLET | Freq: Every day | ORAL | Status: DC
  Administered 2018-06-07 – 2018-06-10 (×3): 81 mg via ORAL
  Filled 2018-06-06 (×3): qty 1

## 2018-06-06 MED ORDER — HEPARIN (PORCINE) IN NACL 100-0.45 UNIT/ML-% IJ SOLN
1000.0000 [IU]/h | INTRAMUSCULAR | Status: DC
  Administered 2018-06-07 – 2018-06-08 (×2): 1000 [IU]/h via INTRAVENOUS
  Filled 2018-06-06 (×3): qty 250

## 2018-06-06 MED ORDER — ACETAMINOPHEN 325 MG PO TABS: 650.0000 mg | ORAL_TABLET | ORAL | Status: DC | PRN

## 2018-06-06 MED ORDER — ONDANSETRON HCL 4 MG/2ML IJ SOLN: 4.0000 mg | Freq: Four times a day (QID) | INTRAMUSCULAR | Status: DC | PRN

## 2018-06-06 MED ORDER — HEPARIN BOLUS VIA INFUSION
4000.0000 [IU] | Freq: Once | INTRAVENOUS | Status: AC
  Administered 2018-06-06: 4000 [IU] via INTRAVENOUS
  Filled 2018-06-06: qty 4000

## 2018-06-06 MED ORDER — CYCLOSPORINE 0.05 % OP EMUL
1.0000 [drp] | Freq: Two times a day (BID) | OPHTHALMIC | Status: DC | PRN
  Filled 2018-06-06: qty 1

## 2018-06-06 MED ORDER — METOPROLOL TARTRATE 12.5 MG HALF TABLET
12.5000 mg | ORAL_TABLET | Freq: Four times a day (QID) | ORAL | Status: DC
  Administered 2018-06-07 – 2018-06-10 (×16): 12.5 mg via ORAL
  Filled 2018-06-06 (×16): qty 1

## 2018-06-06 MED ORDER — ASPIRIN 81 MG PO CHEW: 324.0000 mg | CHEWABLE_TABLET | Freq: Once | ORAL | Status: DC

## 2018-06-06 MED ORDER — ATORVASTATIN CALCIUM 80 MG PO TABS
80.0000 mg | ORAL_TABLET | Freq: Every day | ORAL | Status: DC
  Administered 2018-06-06 – 2018-06-10 (×5): 80 mg via ORAL
  Filled 2018-06-06 (×2): qty 1
  Filled 2018-06-06: qty 4
  Filled 2018-06-06 (×2): qty 1

## 2018-06-06 NOTE — ED Provider Notes (Signed)
[facility] EMERGENCY DEPARTMENT Provider Note   CSN: [id number] Arrival date & time: 06/06/18  1745     History   Chief Complaint Chief Complaint  Patient presents with  . Chest Pain  . Weakness    HPI Karen Owens is a 77 y.o. female.  HPI 77 year old female comes in with chief complaint of chest pain. Patient reports that earlier today she had an episode of chest tightness that was midsternal.  Patient's pain was nonradiating.  Patient noted at that time that she had associated shortness of breath and nausea.  Patient also reports that over the past week she has had worsening shortness of breath with exertion.  She had another episode of chest pain last week which was similar, however it did not last for long time.  Pt has no hx of PE, DVT and denies any exogenous hormone (testosterone / estrogen) use, long distance travels or surgery in the past 6 weeks, active cancer, recent immobilization.  Patient also denies any cough, fevers.   Past Medical History:  Diagnosis Date  . Arthritis   . Asthma   . Complication of anesthesia    difficulty waking up  . Refusal of blood transfusions as patient is Jehovah's Witness   . Thyroid nodule     Patient Active Problem List   Diagnosis Date Noted  . FIBROIDS, UTERUS 12/11/2007  . ELEVATED BLOOD PRESSURE WITHOUT DIAGNOSIS OF HYPERTENSION 12/11/2007  . DENTAL CARIES 10/28/2007  . CALCANEAL SPUR, RIGHT 10/28/2007  . ASTHMA 10/13/2007  . GASTROENTERITIS, ACUTE 08/11/2007  . ANXIETY 07/02/2007  . ARTHRITIS, RIGHT SHOULDER 07/02/2007  . PAP SMEAR, LGSIL, ABNORMAL 05/27/2007  . COLPOSCOPY WITH BIOPSY, HX OF 05/27/2007    Past Surgical History:  Procedure Laterality Date  . DILATION AND CURETTAGE OF UTERUS    . LIPOMA EXCISION     right arm  . TOTAL KNEE ARTHROPLASTY     left  . TOTAL KNEE ARTHROPLASTY  08/20/2012   right knee  . TOTAL KNEE ARTHROPLASTY  08/20/2012   Procedure: TOTAL KNEE ARTHROPLASTY;   Surgeon: Ninetta Lights, MD;  Location: [facility];  Service: Orthopedics;  Laterality: Right;     OB History   None      Home Medications    Prior to Admission medications   Medication Sig Start Date End Date Taking? Authorizing Provider  albuterol (PROVENTIL HFA;VENTOLIN HFA) 108 (90 BASE) MCG/ACT inhaler Inhale 2 puffs into the lungs every 6 (six) hours as needed.    [provider]  DimenhyDRINATE (MOTION SICKNESS PO) Take 1 tablet by mouth daily as needed. For dizziness.    [provider]  enoxaparin (LOVENOX) 30 MG/0.3ML injection Inject 0.3 mLs (30 mg total) into the skin every 12 (twelve) hours. 08/22/12   Lanae Crumbly, PA-C  HYDROcodone-acetaminophen [facility]) 10-325 MG per tablet Take 1-2 tablets by mouth every 4 (four) hours as needed for pain (breakthrough pain). 08/22/12   Lanae Crumbly, PA-C  methocarbamol (ROBAXIN) 500 MG tablet Take 1 tablet (500 mg total) by mouth every 6 (six) hours as needed (spasms). 08/22/12   Lanae Crumbly, PA-C  warfarin (COUMADIN) 5 MG tablet Take 1 tablet (5 mg total) by mouth daily. 08/22/12   Lanae Crumbly, PA-C    Family History No family history on file.  Social History Social History   Tobacco Use  . Smoking status: Former Smoker    Last attempt to quit: 09/10/1982    Years since quitting:  35.7  . Smokeless tobacco: Never Used  Substance Use Topics  . Alcohol use: Yes    Comment: occasional  . Drug use: No     Allergies   Tuberculin tests   Review of Systems Review of Systems  Constitutional: Positive for activity change.  Respiratory: Positive for shortness of breath.   Cardiovascular: Positive for chest pain.  Gastrointestinal: Positive for nausea.  Allergic/Immunologic: Negative for immunocompromised state.  Hematological: Does not bruise/bleed easily.  All other systems reviewed and are negative.    Physical Exam Updated Vital Signs BP 131/70 (BP Location: Right Arm)   Pulse 92   Temp 98 F  (36.7 C) (Oral)   Resp (!) 25   Ht 5\' 6"  (1.676 m)   Wt 99.8 kg   SpO2 95%   BMI 35.51 kg/m   Physical Exam  Constitutional: She is oriented to person, place, and time. She appears well-developed.  HENT:  Head: Normocephalic and atraumatic.  Eyes: EOM are normal.  Neck: Normal range of motion. Neck supple.  Cardiovascular: Normal rate, intact distal pulses and normal pulses.  Pulmonary/Chest: Effort normal.  Abdominal: Bowel sounds are normal.  Musculoskeletal:       Right lower leg: She exhibits no tenderness and no edema.       Left lower leg: She exhibits no tenderness and no edema.  Neurological: She is alert and oriented to person, place, and time.  Skin: Skin is warm and dry.  Nursing note and vitals reviewed.    ED Treatments / Results  Labs (all labs ordered are listed, but only abnormal results are displayed) Labs Reviewed  I-STAT TROPONIN, ED - Abnormal; Notable for the following components:      Result Value   Troponin i, poc 0.21 (*)    All other components within normal limits  BASIC METABOLIC PANEL  CBC  TROPONIN I    EKG EKG Interpretation  Date/Time:  Friday June 06 2018 17:55:29 EDT Ventricular Rate:  90 PR Interval:    QRS Duration: 108 QT Interval:  364 QTC Calculation: 446 R Axis:   -32 Text Interpretation:  Sinus rhythm RSR' in V1 or V2, right VCD or RVH Left ventricular hypertrophy Inferior infarct, old No acute changes slight ST elevation in avR Reconfirmed by Varney Biles [phone]) on 06/06/2018 7:32:40 PM   Radiology Dg Chest 2 View  Result Date: 06/06/2018 CLINICAL DATA:  Central chest pain EXAM: CHEST - 2 VIEW COMPARISON:  08/15/2012 FINDINGS: Lungs are clear.  No pleural effusion or pneumothorax. The heart is top-normal in size. Degenerative changes of the lower thoracic spine. IMPRESSION: No evidence of acute cardiopulmonary disease. Electronically Signed   By: Julian Hy M.D.   On: 06/06/2018 19:12     Procedures Procedures (including critical care time)  CRITICAL CARE Performed by: Elhadj Girton   Total critical care time: 36 minutes  Critical care time was exclusive of separately billable procedures and treating other patients.  Critical care was necessary to treat or prevent imminent or life-threatening deterioration.  Critical care was time spent personally by me on the following activities: development of treatment plan with patient and/or surrogate as well as nursing, discussions with consultants, evaluation of patient's response to treatment, examination of patient, obtaining history from patient or surrogate, ordering and performing treatments and interventions, ordering and review of laboratory studies, ordering and review of radiographic studies, pulse oximetry and re-evaluation of patient's condition.   Medications Ordered in ED Medications  aspirin chewable tablet 324 mg (  has no administration in time range)     Initial Impression / Assessment and Plan / ED Course  I have reviewed the triage vital signs and the nursing notes.  Pertinent labs & imaging results that were available during my care of the patient were reviewed by me and considered in my medical decision making (see chart for details).     77 year old female comes in with chief complaint of chest pain.  Patient has nonspecific chest discomfort that is midsternal.  Discomfort is described as pressure type pain and there is associated shortness of breath and nausea.  Additionally, patient has been having exertional dyspnea.  Although patient is 66, she is quite healthy.  She has history of high cholesterol and is never required any cardiac work-up.  EKG is reassuring, however initial troponin is slightly elevated.  We will admit patient for an STEMI.  Cardiology has been consulted.  Final Clinical Impressions(s) / ED Diagnoses   Final diagnoses:  NSTEMI (non-ST elevated myocardial infarction) [facility])     ED Discharge Orders    None       Varney Biles, MD 06/06/18 1946

## 2018-06-06 NOTE — Progress Notes (Signed)
ANTICOAGULATION CONSULT NOTE  Pharmacy Consult for heparin Indication: chest pain/ACS  Heparin Dosing Weight: 81.8 kg  Labs: Recent Labs    06/06/18 1849  HGB 12.5  HCT 38.8  PLT 159  CREATININE 0.90    Assessment: 83 yof presenting with CP. Pharmacy consulted to dose heparin for ACS. Not on anticoagulation PTA. CBC wnl, trop 0.21. No bleed documented.  Goal of Therapy:  Heparin level 0.3-0.7 units/ml Monitor platelets by anticoagulation protocol: Yes   Plan:  Heparin 4000 unit bolus Start heparin at 1000 units/hr 8h heparin level Daily heparin level/CBC Monitor s/sx bleeding  Elicia Lamp, PharmD, BCPS Clinical Pharmacist 06/06/2018 9:00 PM

## 2018-06-06 NOTE — H&P (Signed)
Cardiology History & Physical    Patient ID: Karen Owens MRN: [id number], DOB: Dec 23, 1940 Date of Encounter: 06/06/2018, 10:49 PM Primary Physician: Harlan Stains, MD  Chief Complaint: Chest pain   HPI: Karen Owens is a 77 y.o. female with history of hyperlipidemia, who presents for chest pain.  Patient had an episode of chest discomfort on Sunday evening that occurred while at rest and lasted approximately 20 to 30 minutes.  He was accompanied by significant shortness of breath and nausea.  This resolved without intervention.  The patient was doing well until today, when she had another similar episode of chest discomfort that lasted over 30 minutes.  The patient presented to the ED for evaluation.  Shortly after arrival to the ED and receiving full dose aspirin, the pain subsided.  Her initial vital signs were in normal limits.  Her ECG showed inferior Q waves without any ST elevations or other acute ischemic changes.  She was started on heparin drip and admitted to the cardiology service for further management.  Upon my interview the patient is comfortable and has not had any recurrence of her chest pain.  Past Medical History:  Diagnosis Date  . Arthritis   . Asthma   . Complication of anesthesia    difficulty waking up  . Refusal of blood transfusions as patient is Jehovah's Witness   . Thyroid nodule      Surgical History:  Past Surgical History:  Procedure Laterality Date  . DILATION AND CURETTAGE OF UTERUS    . LIPOMA EXCISION     right arm  . TOTAL KNEE ARTHROPLASTY     left  . TOTAL KNEE ARTHROPLASTY  08/20/2012   right knee  . TOTAL KNEE ARTHROPLASTY  08/20/2012   Procedure: TOTAL KNEE ARTHROPLASTY;  Surgeon: Ninetta Lights, MD;  Location: [facility];  Service: Orthopedics;  Laterality: Right;     Home Meds: Prior to Admission medications   Medication Sig Start Date End Date Taking? Authorizing Provider  BEE POLLEN PO Take 2 capsules by mouth 2 (two) times daily  with breakfast and lunch.   Yes [provider]  cycloSPORINE (RESTASIS MULTIDOSE) 0.05 % ophthalmic emulsion Place 1 drop into both eyes 2 (two) times daily as needed (dry eyes).   Yes [provider]  ibuprofen (ADVIL,MOTRIN) 800 MG tablet Take 800 mg by mouth daily as needed (inflammation/pain).  05/05/18  Yes [provider]  Nutritional Supplements (IMMUNOCAL PO) Take 1 packet by mouth daily. Mix in juice and drink   Yes [provider]  PRESCRIPTION MEDICATION Take 4 tablets by mouth See admin instructions. Take 4 tablets (antibiotic) by mouth one hour prior to dental appointment   Yes [provider]  HYDROcodone-acetaminophen (NORCO) 10-325 MG per tablet Take 1-2 tablets by mouth every 4 (four) hours as needed for pain (breakthrough pain). Patient not taking: Reported on 06/06/2018 08/22/12   Lanae Crumbly, PA-C  methocarbamol (ROBAXIN) 500 MG tablet Take 1 tablet (500 mg total) by mouth every 6 (six) hours as needed (spasms). Patient not taking: Reported on 06/06/2018 08/22/12   Lanae Crumbly, PA-C    Allergies:  Allergies  Allergen Reactions  . Tuberculin Tests Hives    Social History   Socioeconomic History  . Marital status: Divorced    Spouse name: Not on file  . Number of children: Not on file  . Years of education: Not on file  . Highest education level: Not on file  Occupational History  . Not on file  Social Needs  . Financial resource strain: Not on file  . Food insecurity:    Worry: Not on file    Inability: Not on file  . Transportation needs:    Medical: Not on file    Non-medical: Not on file  Tobacco Use  . Smoking status: Former Smoker    Last attempt to quit: 09/10/1982    Years since quitting: 35.7  . Smokeless tobacco: Never Used  Substance and Sexual Activity  . Alcohol use: Yes    Comment: occasional  . Drug use: No  . Sexual activity: Never    Birth control/protection: Post-menopausal  Lifestyle  .  Physical activity:    Days per week: Not on file    Minutes per session: Not on file  . Stress: Not on file  Relationships  . Social connections:    Talks on phone: Not on file    Gets together: Not on file    Attends religious service: Not on file    Active member of club or organization: Not on file    Attends meetings of clubs or organizations: Not on file    Relationship status: Not on file  . Intimate partner violence:    Fear of current or ex partner: Not on file    Emotionally abused: Not on file    Physically abused: Not on file    Forced sexual activity: Not on file  Other Topics Concern  . Not on file  Social History Narrative  . Not on file     No family history on file.  Review of Systems: All other systems reviewed and are otherwise negative except as noted above.  Labs:   Lab Results  Component Value Date   WBC 13.9 (H) 06/06/2018   HGB 12.5 06/06/2018   HCT 38.8 06/06/2018   MCV 97.2 06/06/2018   PLT 159 06/06/2018    Recent Labs  Lab 06/06/18 1849  NA 142  K 3.4*  CL 108  CO2 23  BUN 16  CREATININE 0.90  CALCIUM 10.4*  GLUCOSE 121*   Recent Labs    06/06/18 1944  TROPONINI 0.35*   No results found for: CHOL, HDL, LDLCALC, TRIG No results found for: DDIMER  Radiology/Studies:  Dg Chest 2 View  Result Date: 06/06/2018 CLINICAL DATA:  Central chest pain EXAM: CHEST - 2 VIEW COMPARISON:  08/15/2012 FINDINGS: Lungs are clear.  No pleural effusion or pneumothorax. The heart is top-normal in size. Degenerative changes of the lower thoracic spine. IMPRESSION: No evidence of acute cardiopulmonary disease. Electronically Signed   By: Julian Hy M.D.   On: 06/06/2018 19:12   Wt Readings from Last 3 Encounters:  06/06/18 99.8 kg  08/22/12 104.3 kg  08/15/12 104.1 kg    EKG: Normal sinus rhythm, inferior Q waves no acute ischemic changes.  Physical Exam: Blood pressure 101/90, pulse 85, temperature 98 F (36.7 C), temperature source  Oral, resp. rate 17, height 5\' 6"  (1.676 m), weight 99.8 kg, SpO2 95 %. Body mass index is 35.51 kg/m. General: Well developed, well nourished, in no acute distress. Head: Normocephalic, atraumatic, sclera non-icteric, no xanthomas, nares are without discharge.  Neck: Negative for carotid bruits. JVD not elevated. Lungs: Clear bilaterally to auscultation without wheezes, rales, or rhonchi. Breathing is unlabored. Heart: RRR with S1 S2. No murmurs, rubs, or gallops appreciated. Abdomen: Soft, non-tender, non-distended with normoactive bowel sounds. No hepatomegaly. No rebound/guarding. No obvious abdominal masses.  Msk:  Strength and tone appear normal for age. Extremities: No clubbing or cyanosis. No edema.  Distal pedal pulses are 2+ and equal bilaterally. Neuro: Alert and oriented X 3. No focal deficit. No facial asymmetry. Moves all extremities spontaneously. Psych:  Responds to questions appropriately with a normal affect.    Assessment and Plan  77 year old lady with a history of hyperlipidemia who presents with chest pain, found to have NSTEMI.  1.  NSTEMI: Chest pain-free currently.  Continue IV heparin drip and low-dose aspirin.  Plan for cardiac catheterization going forward.  Echocardiogram is been ordered.  Will start low-dose beta-blocker as patient's heart rate is in the 80s to 90s.  Could consider addition of low-dose lisinopril.  2.  Hyperlipidemia: We will increase intensity of therapy to high intensity atorvastatin.  Signed, Doylene Canning, MD 06/06/2018, 10:49 PM

## 2018-06-06 NOTE — ED Notes (Signed)
Patient transported to X-ray 

## 2018-06-06 NOTE — ED Triage Notes (Signed)
Pt from home with ems for centralized chest pressure that started about 45 mins ago from arrival to ED. Pain gets worse with inspiration. Pt also c.o generalized weakness and SOB with mucous production that started today. Daughter at home reported some slurred speech but stroke screen negative. Pt a.o upon arrival to ED, nad.   BP 128/84 HR90 90% on room air, 2L [place] applied by EMS and increased to 98%. [id number]

## 2018-06-07 ENCOUNTER — Encounter (HOSPITAL_COMMUNITY): Payer: Self-pay | Admitting: *Deleted

## 2018-06-07 ENCOUNTER — Inpatient Hospital Stay (HOSPITAL_COMMUNITY): Payer: Medicare Other

## 2018-06-07 DIAGNOSIS — I214 Non-ST elevation (NSTEMI) myocardial infarction: Secondary | ICD-10-CM

## 2018-06-07 DIAGNOSIS — I361 Nonrheumatic tricuspid (valve) insufficiency: Secondary | ICD-10-CM

## 2018-06-07 LAB — BASIC METABOLIC PANEL
Anion gap: 10 (ref 5–15)
BUN: 13 mg/dL (ref 8–23)
CO2: 22 mmol/L (ref 22–32)
Calcium: 10.1 mg/dL (ref 8.9–10.3)
Chloride: 107 mmol/L (ref 98–111)
Creatinine, Ser: 0.85 mg/dL (ref 0.44–1.00)
GFR calc Af Amer: >60 mL/min (ref >60)
GFR calc non Af Amer: >60 mL/min (ref >60)
Glucose, Bld: 101 mg/dL — ABNORMAL HIGH (ref 70–99)
Potassium: 3.8 mmol/L (ref 3.5–5.1)
Sodium: 139 mmol/L (ref 135–145)

## 2018-06-07 LAB — TROPONIN I
Troponin I: 1.06 ng/mL (ref <0.03)
Troponin I: 1.03 ng/mL (ref <0.03)

## 2018-06-07 LAB — CBC
HEMATOCRIT: 36.4 % (ref 36.0–46.0)
Hemoglobin: 11.9 g/dL — ABNORMAL LOW (ref 12.0–15.0)
MCH: 31.2 pg (ref 26.0–34.0)
MCHC: 32.7 g/dL (ref 30.0–36.0)
MCV: 95.5 fL (ref 78.0–100.0)
PLATELETS: 152 10*3/uL (ref 150–400)
RBC: 3.81 MIL/uL — ABNORMAL LOW (ref 3.87–5.11)
RDW: 12.7 % (ref 11.5–15.5)
WBC: 10.3 10*3/uL (ref 4.0–10.5)

## 2018-06-07 LAB — HEPARIN LEVEL (UNFRACTIONATED): Heparin Unfractionated: 0.57 IU/mL (ref 0.30–0.70)

## 2018-06-07 LAB — LIPID PANEL
Cholesterol: 199 mg/dL (ref 0–200)
HDL: 65 mg/dL (ref >40)
LDL Cholesterol: 129 mg/dL — ABNORMAL HIGH (ref 0–99)
Total CHOL/HDL Ratio: 3.1 RATIO
Triglycerides: 26 mg/dL (ref <150)
VLDL: 5 mg/dL (ref 0–40)

## 2018-06-07 LAB — ECHOCARDIOGRAM COMPLETE
Height: 66 in
Weight: 3671.98 oz

## 2018-06-07 LAB — PROTIME-INR
INR: 1.17
Prothrombin Time: 14.9 seconds (ref 11.4–15.2)

## 2018-06-07 LAB — MRSA PCR SCREENING: MRSA BY PCR: NEGATIVE

## 2018-06-07 NOTE — Progress Notes (Signed)
[name] for heparin Indication: chest pain/ACS  Heparin Dosing Weight: 81.8 kg  Labs: Recent Labs    06/06/18 1849 06/06/18 1944 06/07/18 0424  HGB 12.5  --   --   HCT 38.8  --   --   PLT 159  --   --   HEPARINUNFRC  --   --  0.57  CREATININE 0.90  --   --   TROPONINI  --  0.35* 1.06*    Assessment: Karen Owens presenting with CP. Pharmacy consulted to dose heparin for ACS. Not on anticoagulation PTA. CBC wnl, trop 0.21. No bleed documented.  Heparin level at goal this morning at 0.57. No bleeding issues noted overnight.   Goal of Therapy:  Heparin level 0.3-0.7 units/ml Monitor platelets by anticoagulation protocol: Yes   Plan:  Continue heparin at 1000 units/hr Daily heparin level/CBC Monitor s/sx bleeding  Erin Hearing PharmD., BCPS Clinical Pharmacist 06/07/2018 8:20 AM

## 2018-06-07 NOTE — Progress Notes (Signed)
  Echocardiogram 2D Echocardiogram has been performed.  Karen Owens [name] 06/07/2018, 3:41 PM

## 2018-06-07 NOTE — Progress Notes (Signed)
Progress Note  Patient Name: Karen Owens Date of Encounter: 06/07/2018  Primary Cardiologist: No primary care provider on file. New  Subjective   No angina, but has had dyspnea with light activity (going to the bathroom). Troponin peaking at 1.0. ECG without localizing changes. Echo pending.  Inpatient Medications    Scheduled Meds: . aspirin EC  81 mg Oral Daily  . atorvastatin  80 mg Oral q1800  . metoprolol tartrate  12.5 mg Oral Q6H   Continuous Infusions: . heparin Stopped (06/06/18 2150)   PRN Meds: acetaminophen, cycloSPORINE, nitroGLYCERIN, ondansetron (ZOFRAN) IV   Vital Signs    Vitals:   06/07/18 0400 06/07/18 0434 06/07/18 0700 06/07/18 1040  BP: 121/78 121/78 136/79 (!) 147/82  Pulse:  74 71 73  Resp: 17 18 18  (!) 23  Temp:  98.7 F (37.1 C) 98.7 F (37.1 C) 98 F (36.7 C)  TempSrc:  Oral Oral Oral  SpO2: 96% 98% 97% 98%  Weight:      Height:        Intake/Output Summary (Last 24 hours) at 06/07/2018 1132 Last data filed at 06/07/2018 0900 Gross per 24 hour  Intake 0 ml  Output 0 ml  Net 0 ml   Filed Weights   06/06/18 1756 06/06/18 2331  Weight: 99.8 kg 104.1 kg    Telemetry    NSR - Personally Reviewed  ECG    NSR, leftward axis, no acute ST changes. - Personally Reviewed  Physical Exam  Looks comfortable at rest GEN: No acute distress.   Neck: No JVD Cardiac: RRR, no murmurs, rubs, S4 present. Respiratory: Clear to auscultation bilaterally. GI: Soft, nontender, non-distended  MS: No edema; No deformity. Neuro:  Nonfocal  Psych: Normal affect   Labs    Chemistry Recent Labs  Lab 06/06/18 1849 06/07/18 0848  NA 142 139  K 3.4* 3.8  CL 108 107  CO2 23 22  GLUCOSE 121* 101*  BUN 16 13  CREATININE 0.90 0.85  CALCIUM 10.4* 10.1  GFRNONAA >60 >60  GFRAA >60 >60  ANIONGAP 11 10     Hematology Recent Labs  Lab 06/06/18 1849 06/07/18 0848  WBC 13.9* 10.3  RBC 3.99 3.81*  HGB 12.5 11.9*  HCT 38.8 36.4  MCV  97.2 95.5  MCH 31.3 31.2  MCHC 32.2 32.7  RDW 12.6 12.7  PLT 159 152    Cardiac Enzymes Recent Labs  Lab 06/06/18 1944 06/07/18 0424 06/07/18 0848  TROPONINI 0.35* 1.06* 1.03*    Recent Labs  Lab 06/06/18 1854  TROPIPOC 0.21*     BNPNo results for input(s): BNP, PROBNP in the last 168 hours.   DDimer No results for input(s): DDIMER in the last 168 hours.   Radiology    Dg Chest 2 View  Result Date: 06/06/2018 CLINICAL DATA:  Central chest pain EXAM: CHEST - 2 VIEW COMPARISON:  08/15/2012 FINDINGS: Lungs are clear.  No pleural effusion or pneumothorax. The heart is top-normal in size. Degenerative changes of the lower thoracic spine. IMPRESSION: No evidence of acute cardiopulmonary disease. Electronically Signed   By: Julian Hy M.D.   On: 06/06/2018 19:12    Cardiac Studies   Echo pending  Patient Profile     77 y.o. female with symptoms suggestive of unstable angina and labs c/w small NSTEMI, background of  hyperlipidemia.  Assessment & Plan    NSTEMI:  On IV heparin, statin, beta blocker. Currently angina free. Plan cardiac cath and revascularization as indicated.  This procedure has been fully reviewed with the patient and written informed consent has been obtained. Echo pending.  For questions or updates, please contact [place] Please consult [email or web address] for contact info under        Signed, Sanda Klein, MD  06/07/2018, 11:32 AM

## 2018-06-08 DIAGNOSIS — I2721 Secondary pulmonary arterial hypertension: Secondary | ICD-10-CM

## 2018-06-08 DIAGNOSIS — E7800 Pure hypercholesterolemia, unspecified: Secondary | ICD-10-CM

## 2018-06-08 LAB — CBC
HEMATOCRIT: 33.9 % — AB (ref 36.0–46.0)
HEMOGLOBIN: 11 g/dL — AB (ref 12.0–15.0)
MCH: 31 pg (ref 26.0–34.0)
MCHC: 32.4 g/dL (ref 30.0–36.0)
MCV: 95.5 fL (ref 78.0–100.0)
Platelets: 165 10*3/uL (ref 150–400)
RBC: 3.55 MIL/uL — AB (ref 3.87–5.11)
RDW: 12.7 % (ref 11.5–15.5)
WBC: 9.3 10*3/uL (ref 4.0–10.5)

## 2018-06-08 LAB — HEPARIN LEVEL (UNFRACTIONATED): Heparin Unfractionated: 0.43 IU/mL (ref 0.30–0.70)

## 2018-06-08 MED ORDER — SODIUM CHLORIDE 0.9% FLUSH: 3.0000 mL | Freq: Two times a day (BID) | INTRAVENOUS | Status: DC

## 2018-06-08 MED ORDER — ALUM & MAG HYDROXIDE-SIMETH 200-200-20 MG/5ML PO SUSP
30.0000 mL | Freq: Four times a day (QID) | ORAL | Status: DC | PRN
  Administered 2018-06-08: 30 mL via ORAL
  Filled 2018-06-08: qty 30

## 2018-06-08 MED ORDER — SODIUM CHLORIDE 0.9 % WEIGHT BASED INFUSION
1.0000 mL/kg/h | INTRAVENOUS | Status: DC
  Administered 2018-06-09: 1 mL/kg/h via INTRAVENOUS

## 2018-06-08 MED ORDER — SODIUM CHLORIDE 0.9 % WEIGHT BASED INFUSION
3.0000 mL/kg/h | INTRAVENOUS | Status: DC
  Administered 2018-06-09: 3 mL/kg/h via INTRAVENOUS

## 2018-06-08 MED ORDER — SODIUM CHLORIDE 0.9 % IV SOLN: 250.0000 mL | INTRAVENOUS | Status: DC | PRN

## 2018-06-08 MED ORDER — ASPIRIN 81 MG PO CHEW
81.0000 mg | CHEWABLE_TABLET | ORAL | Status: AC
  Administered 2018-06-09: 81 mg via ORAL
  Filled 2018-06-08: qty 1

## 2018-06-08 MED ORDER — SODIUM CHLORIDE 0.9% FLUSH: 3.0000 mL | INTRAVENOUS | Status: DC | PRN

## 2018-06-08 NOTE — Progress Notes (Signed)
[facility] for heparin Indication: chest pain/ACS  Heparin Dosing Weight: 81.8 kg  Labs: Recent Labs    06/06/18 1849 06/06/18 1944 06/07/18 0424 06/07/18 0848 06/08/18 0217  HGB 12.5  --   --  11.9* 11.0*  HCT 38.8  --   --  36.4 33.9*  PLT 159  --   --  152 165  LABPROT  --   --   --  14.9  --   INR  --   --   --  1.17  --   HEPARINUNFRC  --   --  0.57  --  0.43  CREATININE 0.90  --   --  0.85  --   TROPONINI  --  0.35* 1.06* 1.03*  --     Assessment: 55 yof presenting with CP. Pharmacy consulted to dose heparin for ACS. Not on anticoagulation PTA. CBC wnl, trop 0.21. No bleed documented.  Heparin level at goal this morning at 0.43. No bleeding issues noted overnight. CBC stable.  Goal of Therapy:  Heparin level 0.3-0.7 units/ml Monitor platelets by anticoagulation protocol: Yes   Plan:  Continue heparin at 1000 units/hr Daily heparin level/CBC Monitor s/sx bleeding  Erin Hearing PharmD., BCPS Clinical Pharmacist 06/08/2018 8:48 AM

## 2018-06-08 NOTE — H&P (View-Only) (Signed)
Progress Note  Patient Name: Karen Owens Date of Encounter: 06/08/2018  Primary Cardiologist: No primary care provider on file. Nes  Subjective   No angina, mild dyspnea with activity.  Inpatient Medications    Scheduled Meds: . aspirin EC  81 mg Oral Daily  . atorvastatin  80 mg Oral q1800  . metoprolol tartrate  12.5 mg Oral Q6H   Continuous Infusions: . heparin 1,000 Units/hr (06/08/18 1000)   PRN Meds: acetaminophen, cycloSPORINE, nitroGLYCERIN, ondansetron (ZOFRAN) IV   Vital Signs    Vitals:   06/07/18 2006 06/07/18 2300 06/08/18 0317 06/08/18 0819  BP: 117/81 121/80 136/80 133/82  Pulse: 74 78 63 64  Resp: (!) 22 20 (!) 21 18  Temp: 98.7 F (37.1 C) 98.9 F (37.2 C) 98.3 F (36.8 C) 98.7 F (37.1 C)  TempSrc: Oral Oral Oral Oral  SpO2: 100% 95% 96% 94%  Weight:      Height:        Intake/Output Summary (Last 24 hours) at 06/08/2018 1051 Last data filed at 06/08/2018 1000 Gross per 24 hour  Intake 708.28 ml  Output 0 ml  Net 708.28 ml   Filed Weights   06/06/18 1756 06/06/18 2331  Weight: 99.8 kg 104.1 kg    Telemetry    NSR - Personally Reviewed  ECG    No new tracing - Personally Reviewed  Physical Exam  Looks comfortable. GEN: No acute distress.   Neck: No JVD Cardiac: RRR, no murmurs, rubs, or gallops.  Respiratory: Clear to auscultation bilaterally. GI: Soft, nontender, non-distended  MS: No edema; No deformity. Neuro:  Nonfocal  Psych: Normal affect   Labs    Chemistry Recent Labs  Lab 06/06/18 1849 06/07/18 0848  NA 142 139  K 3.4* 3.8  CL 108 107  CO2 23 22  GLUCOSE 121* 101*  BUN 16 13  CREATININE 0.90 0.85  CALCIUM 10.4* 10.1  GFRNONAA >60 >60  GFRAA >60 >60  ANIONGAP 11 10     Hematology Recent Labs  Lab 06/06/18 1849 06/07/18 0848 06/08/18 0217  WBC 13.9* 10.3 9.3  RBC 3.99 3.81* 3.55*  HGB 12.5 11.9* 11.0*  HCT 38.8 36.4 33.9*  MCV 97.2 95.5 95.5  MCH 31.3 31.2 31.0  MCHC 32.2 32.7 32.4    RDW 12.6 12.7 12.7  PLT 159 152 165    Cardiac Enzymes Recent Labs  Lab 06/06/18 1944 06/07/18 0424 06/07/18 0848  TROPONINI 0.35* 1.06* 1.03*    Recent Labs  Lab 06/06/18 1854  TROPIPOC 0.21*     BNPNo results for input(s): BNP, PROBNP in the last 168 hours.   DDimer No results for input(s): DDIMER in the last 168 hours.   Radiology    Dg Chest 2 View  Result Date: 06/06/2018 CLINICAL DATA:  Central chest pain EXAM: CHEST - 2 VIEW COMPARISON:  08/15/2012 FINDINGS: Lungs are clear.  No pleural effusion or pneumothorax. The heart is top-normal in size. Degenerative changes of the lower thoracic spine. IMPRESSION: No evidence of acute cardiopulmonary disease. Electronically Signed   By: Julian Hy M.D.   On: 06/06/2018 19:12    Cardiac Studies  ECHO 06/07/2018  - Left ventricle: The cavity size was normal. Wall thickness was   increased in a pattern of mild LVH. Systolic function was normal.   The estimated ejection fraction was in the range of 50% to 55%.   Possible hypokinesis of the mid-apicalinferolateral myocardium;   consistent with ischemia in the distribution of the  left   circumflex coronary artery. Doppler parameters are consistent   with abnormal left ventricular relaxation (grade 1 diastolic   dysfunction). - Ventricular septum: The contour showed diastolic flattening.   These changes are consistent with RV volume overload. - Mitral valve: Calcified annulus. - Right ventricle: The cavity size was mildly dilated. - Right atrium: The atrium was mildly dilated. - Atrial septum: The septum bowed from right to left, consistent   with increased right atrial pressure. - Tricuspid valve: There was mild-moderate regurgitation directed   centrally. - Pulmonary arteries: Systolic pressure was mildly increased. PA   peak pressure: 44 mm Hg (S).  Patient Profile     77 y.o. female with small NSTEMI (inferolateral hypokinesis, preserved LVEF, peak troponin  1.0)  Assessment & Plan    1. NSTEMI: cardiac cath and possible PCI-stent in AM. This procedure has been fully reviewed with the patient and written informed consent has been obtained. 2. PAH: chronic appearing right heart changes. Consider OSA, CTEPH. Family reports loud snoring and witnessed apnea, although the patient denies hypersomnolence. Recommend outpatient sleep study. 3. HLP: hi dose statin. LDL 129, if CAD confirmed, target <70.    For questions or updates, please contact [place] Please consult [email or web address] for contact info under        Signed, Sanda Klein, MD  06/08/2018, 10:51 AM

## 2018-06-08 NOTE — Progress Notes (Signed)
Offered to watch video regarding cardiac cath/PCI, but refused.

## 2018-06-08 NOTE — Progress Notes (Signed)
2055Pt c/o of epigastric pressure. 5/10. Non reproducable. While talking with pt the pressure lessen to 4/10.  2138Pt took some Maalox per standing orders. Vital signs HR 67. SPOX 96 RA, BP 147/80 (96) RR 19 . Pt resting in bed no signs or symptoms of distress. Will continue to monitor   2140 all chest pressure resloved. Will continue to monitor pt.

## 2018-06-08 NOTE — Progress Notes (Signed)
Progress Note  Patient Name: Karen Owens Date of Encounter: 06/08/2018  Primary Cardiologist: No primary care provider on file. Nes  Subjective   No angina, mild dyspnea with activity.  Inpatient Medications    Scheduled Meds: . aspirin EC  81 mg Oral Daily  . atorvastatin  80 mg Oral q1800  . metoprolol tartrate  12.5 mg Oral Q6H   Continuous Infusions: . heparin 1,000 Units/hr (06/08/18 1000)   PRN Meds: acetaminophen, cycloSPORINE, nitroGLYCERIN, ondansetron (ZOFRAN) IV   Vital Signs    Vitals:   06/07/18 2006 06/07/18 2300 06/08/18 0317 06/08/18 0819  BP: 117/81 121/80 136/80 133/82  Pulse: 74 78 63 64  Resp: (!) 22 20 (!) 21 18  Temp: 98.7 F (37.1 C) 98.9 F (37.2 C) 98.3 F (36.8 C) 98.7 F (37.1 C)  TempSrc: Oral Oral Oral Oral  SpO2: 100% 95% 96% 94%  Weight:      Height:        Intake/Output Summary (Last 24 hours) at 06/08/2018 1051 Last data filed at 06/08/2018 1000 Gross per 24 hour  Intake 708.28 ml  Output 0 ml  Net 708.28 ml   Filed Weights   06/06/18 1756 06/06/18 2331  Weight: 99.8 kg 104.1 kg    Telemetry    NSR - Personally Reviewed  ECG    No new tracing - Personally Reviewed  Physical Exam  Looks comfortable. GEN: No acute distress.   Neck: No JVD Cardiac: RRR, no murmurs, rubs, or gallops.  Respiratory: Clear to auscultation bilaterally. GI: Soft, nontender, non-distended  MS: No edema; No deformity. Neuro:  Nonfocal  Psych: Normal affect   Labs    Chemistry Recent Labs  Lab 06/06/18 1849 06/07/18 0848  NA 142 139  K 3.4* 3.8  CL 108 107  CO2 23 22  GLUCOSE 121* 101*  BUN 16 13  CREATININE 0.90 0.85  CALCIUM 10.4* 10.1  GFRNONAA >60 >60  GFRAA >60 >60  ANIONGAP 11 10     Hematology Recent Labs  Lab 06/06/18 1849 06/07/18 0848 06/08/18 0217  WBC 13.9* 10.3 9.3  RBC 3.99 3.81* 3.55*  HGB 12.5 11.9* 11.0*  HCT 38.8 36.4 33.9*  MCV 97.2 95.5 95.5  MCH 31.3 31.2 31.0  MCHC 32.2 32.7 32.4    RDW 12.6 12.7 12.7  PLT 159 152 165    Cardiac Enzymes Recent Labs  Lab 06/06/18 1944 06/07/18 0424 06/07/18 0848  TROPONINI 0.35* 1.06* 1.03*    Recent Labs  Lab 06/06/18 1854  TROPIPOC 0.21*     BNPNo results for input(s): BNP, PROBNP in the last 168 hours.   DDimer No results for input(s): DDIMER in the last 168 hours.   Radiology    Dg Chest 2 View  Result Date: 06/06/2018 CLINICAL DATA:  Central chest pain EXAM: CHEST - 2 VIEW COMPARISON:  08/15/2012 FINDINGS: Lungs are clear.  No pleural effusion or pneumothorax. The heart is top-normal in size. Degenerative changes of the lower thoracic spine. IMPRESSION: No evidence of acute cardiopulmonary disease. Electronically Signed   By: Julian Hy M.D.   On: 06/06/2018 19:12    Cardiac Studies  ECHO 06/07/2018  - Left ventricle: The cavity size was normal. Wall thickness was   increased in a pattern of mild LVH. Systolic function was normal.   The estimated ejection fraction was in the range of 50% to 55%.   Possible hypokinesis of the mid-apicalinferolateral myocardium;   consistent with ischemia in the distribution of the  left   circumflex coronary artery. Doppler parameters are consistent   with abnormal left ventricular relaxation (grade 1 diastolic   dysfunction). - Ventricular septum: The contour showed diastolic flattening.   These changes are consistent with RV volume overload. - Mitral valve: Calcified annulus. - Right ventricle: The cavity size was mildly dilated. - Right atrium: The atrium was mildly dilated. - Atrial septum: The septum bowed from right to left, consistent   with increased right atrial pressure. - Tricuspid valve: There was mild-moderate regurgitation directed   centrally. - Pulmonary arteries: Systolic pressure was mildly increased. PA   peak pressure: 44 mm Hg (S).  Patient Profile     77 y.o. female with small NSTEMI (inferolateral hypokinesis, preserved LVEF, peak troponin  1.0)  Assessment & Plan    1. NSTEMI: cardiac cath and possible PCI-stent in AM. This procedure has been fully reviewed with the patient and written informed consent has been obtained. 2. PAH: chronic appearing right heart changes. Consider OSA, CTEPH. Family reports loud snoring and witnessed apnea, although the patient denies hypersomnolence. Recommend outpatient sleep study. 3. HLP: hi dose statin. LDL 129, if CAD confirmed, target <70.    For questions or updates, please contact [place] Please consult [email or web address] for contact info under        Signed, Sanda Klein, MD  06/08/2018, 10:51 AM

## 2018-06-09 ENCOUNTER — Encounter (HOSPITAL_COMMUNITY): Payer: Self-pay | Admitting: Cardiovascular Disease

## 2018-06-09 ENCOUNTER — Inpatient Hospital Stay (HOSPITAL_COMMUNITY): Payer: Medicare Other

## 2018-06-09 ENCOUNTER — Encounter (HOSPITAL_COMMUNITY)
Admission: EM | Disposition: A | Discharge status: Home / Self Care | Payer: Self-pay | Source: Home / Self Care | Attending: Cardiology

## 2018-06-09 DIAGNOSIS — R072 Precordial pain: Secondary | ICD-10-CM

## 2018-06-09 DIAGNOSIS — R748 Abnormal levels of other serum enzymes: Secondary | ICD-10-CM

## 2018-06-09 HISTORY — PX: LEFT HEART CATH AND CORONARY ANGIOGRAPHY: CATH118249

## 2018-06-09 LAB — CBC
HCT: 34 % — ABNORMAL LOW (ref 36.0–46.0)
HEMOGLOBIN: 11.1 g/dL — AB (ref 12.0–15.0)
MCH: 31.5 pg (ref 26.0–34.0)
MCHC: 32.6 g/dL (ref 30.0–36.0)
MCV: 96.6 fL (ref 78.0–100.0)
Platelets: 157 10*3/uL (ref 150–400)
RBC: 3.52 MIL/uL — ABNORMAL LOW (ref 3.87–5.11)
RDW: 12.8 % (ref 11.5–15.5)
WBC: 9.2 10*3/uL (ref 4.0–10.5)

## 2018-06-09 LAB — D-DIMER, QUANTITATIVE: D-Dimer, Quant: 9.11 ug/mL-FEU — ABNORMAL HIGH (ref 0.00–0.50)

## 2018-06-09 LAB — HEPARIN LEVEL (UNFRACTIONATED): Heparin Unfractionated: 0.44 IU/mL (ref 0.30–0.70)

## 2018-06-09 SURGERY — LEFT HEART CATH AND CORONARY ANGIOGRAPHY
Anesthesia: LOCAL

## 2018-06-09 MED ORDER — HEPARIN (PORCINE) IN NACL 1000-0.9 UT/500ML-% IV SOLN
INTRAVENOUS | Status: DC | PRN
  Administered 2018-06-09 (×2): 500 mL

## 2018-06-09 MED ORDER — ACETAMINOPHEN 325 MG PO TABS: 650.0000 mg | ORAL_TABLET | ORAL | Status: DC | PRN

## 2018-06-09 MED ORDER — ONDANSETRON HCL 4 MG/2ML IJ SOLN: 4.0000 mg | Freq: Four times a day (QID) | INTRAMUSCULAR | Status: DC | PRN

## 2018-06-09 MED ORDER — LIDOCAINE HCL (PF) 1 % IJ SOLN
INTRAMUSCULAR | Status: AC
  Filled 2018-06-09: qty 30

## 2018-06-09 MED ORDER — SODIUM CHLORIDE 0.9% FLUSH: 3.0000 mL | INTRAVENOUS | Status: DC | PRN

## 2018-06-09 MED ORDER — HEPARIN SODIUM (PORCINE) 1000 UNIT/ML IJ SOLN
INTRAMUSCULAR | Status: DC | PRN
  Administered 2018-06-09: 5000 [IU] via INTRAVENOUS

## 2018-06-09 MED ORDER — MIDAZOLAM HCL 2 MG/2ML IJ SOLN
INTRAMUSCULAR | Status: DC | PRN
  Administered 2018-06-09: 1 mg via INTRAVENOUS

## 2018-06-09 MED ORDER — MIDAZOLAM HCL 2 MG/2ML IJ SOLN
INTRAMUSCULAR | Status: AC
  Filled 2018-06-09: qty 2

## 2018-06-09 MED ORDER — IOPAMIDOL (ISOVUE-370) INJECTION 76%
INTRAVENOUS | Status: AC
  Filled 2018-06-09: qty 100

## 2018-06-09 MED ORDER — MORPHINE SULFATE (PF) 2 MG/ML IV SOLN: 2.0000 mg | INTRAVENOUS | Status: DC | PRN

## 2018-06-09 MED ORDER — HEPARIN (PORCINE) IN NACL 100-0.45 UNIT/ML-% IJ SOLN
1150.0000 [IU]/h | INTRAMUSCULAR | Status: AC
  Administered 2018-06-09: 1000 [IU]/h via INTRAVENOUS
  Filled 2018-06-09: qty 250

## 2018-06-09 MED ORDER — VERAPAMIL HCL 2.5 MG/ML IV SOLN
INTRAVENOUS | Status: DC | PRN
  Administered 2018-06-09: 10 mL via INTRA_ARTERIAL

## 2018-06-09 MED ORDER — SODIUM CHLORIDE 0.9 % IV SOLN: INTRAVENOUS | Status: AC

## 2018-06-09 MED ORDER — LIDOCAINE HCL (PF) 1 % IJ SOLN
INTRAMUSCULAR | Status: DC | PRN
  Administered 2018-06-09: 2 mL

## 2018-06-09 MED ORDER — TECHNETIUM TO 99M ALBUMIN AGGREGATED
3.7400 | Freq: Once | INTRAVENOUS | Status: AC | PRN
  Administered 2018-06-09: 3.74 via INTRAVENOUS

## 2018-06-09 MED ORDER — HEPARIN (PORCINE) IN NACL 1000-0.9 UT/500ML-% IV SOLN
INTRAVENOUS | Status: AC
  Filled 2018-06-09: qty 1000

## 2018-06-09 MED ORDER — IOHEXOL 350 MG/ML SOLN
INTRAVENOUS | Status: DC | PRN
  Administered 2018-06-09: 100 mL via INTRA_ARTERIAL

## 2018-06-09 MED ORDER — SODIUM CHLORIDE 0.9 % IV SOLN: 250.0000 mL | INTRAVENOUS | Status: DC | PRN

## 2018-06-09 MED ORDER — TECHNETIUM TC 99M DIETHYLENETRIAME-PENTAACETIC ACID
29.4000 | Freq: Once | INTRAVENOUS | Status: AC | PRN
  Administered 2018-06-09: 29.4 via RESPIRATORY_TRACT

## 2018-06-09 MED ORDER — VERAPAMIL HCL 2.5 MG/ML IV SOLN
INTRAVENOUS | Status: AC
  Filled 2018-06-09: qty 2

## 2018-06-09 MED ORDER — FENTANYL CITRATE (PF) 100 MCG/2ML IJ SOLN
INTRAMUSCULAR | Status: AC
  Filled 2018-06-09: qty 2

## 2018-06-09 MED ORDER — SODIUM CHLORIDE 0.9% FLUSH
3.0000 mL | Freq: Two times a day (BID) | INTRAVENOUS | Status: DC
  Administered 2018-06-09: 3 mL via INTRAVENOUS

## 2018-06-09 MED ORDER — FENTANYL CITRATE (PF) 100 MCG/2ML IJ SOLN
INTRAMUSCULAR | Status: DC | PRN
  Administered 2018-06-09: 25 ug via INTRAVENOUS

## 2018-06-09 MED ORDER — ASPIRIN 81 MG PO CHEW: 81.0000 mg | CHEWABLE_TABLET | Freq: Every day | ORAL | Status: DC

## 2018-06-09 SURGICAL SUPPLY — 13 items
CATH INFINITI JR4 5F (CATHETERS) ×1 IMPLANT
CATH LAUNCHER 5F NOTO (CATHETERS) ×1 IMPLANT
CATH OPTITORQUE TIG 4.0 5F (CATHETERS) ×1 IMPLANT
CATHETER LAUNCHER 5F NOTO (CATHETERS) ×2
DEVICE RAD COMP TR BAND LRG (VASCULAR PRODUCTS) ×1 IMPLANT
GLIDESHEATH SLEND A-KIT 6F 22G (SHEATH) ×1 IMPLANT
GUIDEWIRE INQWIRE 1.5J.035X260 (WIRE) IMPLANT
INQWIRE 1.5J .035X260CM (WIRE) ×2
KIT HEART LEFT (KITS) ×2 IMPLANT
PACK CARDIAC CATHETERIZATION (CUSTOM PROCEDURE TRAY) ×2 IMPLANT
TRANSDUCER W/STOPCOCK (MISCELLANEOUS) ×2 IMPLANT
TUBING CIL FLEX 10 FLL-RA (TUBING) ×2 IMPLANT
WIRE HI TORQ VERSACORE-J 145CM (WIRE) ×1 IMPLANT

## 2018-06-09 NOTE — Progress Notes (Signed)
TR Band removed tolerated well. No bleeding noted. Continue to monitor.

## 2018-06-09 NOTE — Progress Notes (Signed)
Back from the cath. Lab by bed awake and alert.TRBand to right wrist intacr, pilse ox to right thumb, elevated with pillow.

## 2018-06-09 NOTE — Progress Notes (Signed)
Back from the ct scan. Reported that ct scan of the chest not done sec. to iv blew while flushing with saline. Pt. refused to have  another iv line to be inserted. NP aware who claimed to come to talk to the pt.

## 2018-06-09 NOTE — Care Management Note (Signed)
Case Management Note  Patient Details  Name: Karen Owens MRN: [id number] Date of Birth: 08/07/1941  Subjective/Objective: Pt is s/sp cath lab - admitted with NSTEMI                    Action/Plan:  PTA independent from home alone - however will discharge to daughter Karen Owens's home.  Pt has PCP.  No CM needs determined at the time this note was written   Expected Discharge Date:  06/09/18               Expected Discharge Plan:  Home/Self Care  In-House Referral:     Discharge planning Services     Post Acute Care Choice:    Choice offered to:     DME Arranged:    DME Agency:     HH Arranged:    [facility] Agency:     Status of Service:  Completed, signed off  If discussed at [employer] of Stay Meetings, dates discussed:    Additional Comments:  Maryclare Labrador, RN 06/09/2018, 9:13 AM

## 2018-06-09 NOTE — Progress Notes (Signed)
[place] for heparin Indication: chest pain/ACS  Heparin Dosing Weight: 81.8 kg  Labs: Recent Labs    06/07/18 0424  06/07/18 0848 06/08/18 0217 06/09/18 0606  HGB  --    < > 11.9* 11.0* 11.1*  HCT  --   --  36.4 33.9* 34.0*  PLT  --   --  152 165 157  LABPROT  --   --  14.9  --   --   INR  --   --  1.17  --   --   HEPARINUNFRC 0.57  --   --  0.43 0.44  CREATININE  --   --  0.85  --   --   TROPONINI 1.06*  --  1.03*  --   --    < > = values in this interval not displayed.    Assessment: 60 yof presenting with CP. Pharmacy consulted to dose heparin for ACS. Not on anticoagulation PTA.   LHC showed normal coronary arteries. D-dimer positive this morning, pharmacy consulted to resume IV heparin for possible PE with no bolus. Previously therapeutic at 1000 units/hr.  Heparin level at goal this morning at 0.43. No bleeding issues noted overnight. CBC stable.  Goal of Therapy:  Heparin level 0.3-0.7 units/ml Monitor platelets by anticoagulation protocol: Yes   Plan:  -Restart heparin at 1000 units/hr -8hr heparin level -Daily heparin level/CBC -Monitor s/sx bleeding  Arrie Senate, PharmD, BCPS Clinical Pharmacist [phone] Please check AMION for all [place] numbers 06/09/2018

## 2018-06-09 NOTE — Progress Notes (Signed)
Progress Note  Patient Name: Karen Owens Date of Encounter: 06/09/2018  Primary Cardiologist: Sanda Klein, MD   Subjective   No further chest pain.  Cath was clean.   Inpatient Medications    Scheduled Meds: . aspirin  81 mg Oral Daily  . aspirin EC  81 mg Oral Daily  . atorvastatin  80 mg Oral q1800  . metoprolol tartrate  12.5 mg Oral Q6H  . sodium chloride flush  3 mL Intravenous Q12H   Continuous Infusions: . sodium chloride 75 mL/hr at 06/09/18 0934  . sodium chloride     PRN Meds: sodium chloride, acetaminophen, acetaminophen, alum & mag hydroxide-simeth, cycloSPORINE, morphine injection, nitroGLYCERIN, ondansetron (ZOFRAN) IV, ondansetron (ZOFRAN) IV, sodium chloride flush   Vital Signs    Vitals:   06/09/18 0806 06/09/18 0811 06/09/18 0816 06/09/18 0845  BP: (!) 141/76 (!) 147/73 (!) 145/78   Pulse: 66 69 61 65  Resp: 16 13 15    Temp:      TempSrc:      SpO2: 97% 98% 98% 98%  Weight:      Height:        Intake/Output Summary (Last 24 hours) at 06/09/2018 1004 Last data filed at 06/09/2018 0900 Gross per 24 hour  Intake 689.22 ml  Output 0 ml  Net 689.22 ml   Filed Weights   06/06/18 1756 06/06/18 2331 06/08/18 2123  Weight: 99.8 kg 104.1 kg 104.7 kg    Telemetry    NSR - Personally Reviewed  ECG    None recent  Physical Exam   GEN: No acute distress.   Neck: No JVD Cardiac: RRR, no murmurs, rubs, or gallops.  Respiratory: Clear to auscultation bilaterally. GI: Soft, nontender, non-distended  MS: No edema; No deformity. No right wrist hematoma Neuro:  Nonfocal  Psych: Normal affect   Labs    Chemistry Recent Labs  Lab 06/06/18 1849 06/07/18 0848  NA 142 139  K 3.4* 3.8  CL 108 107  CO2 23 22  GLUCOSE 121* 101*  BUN 16 13  CREATININE 0.90 0.85  CALCIUM 10.4* 10.1  GFRNONAA >60 >60  GFRAA >60 >60  ANIONGAP 11 10     Hematology Recent Labs  Lab 06/07/18 0848 06/08/18 0217 06/09/18 0606  WBC 10.3 9.3 9.2  RBC  3.81* 3.55* 3.52*  HGB 11.9* 11.0* 11.1*  HCT 36.4 33.9* 34.0*  MCV 95.5 95.5 96.6  MCH 31.2 31.0 31.5  MCHC 32.7 32.4 32.6  RDW 12.7 12.7 12.8  PLT 152 165 157    Cardiac Enzymes Recent Labs  Lab 06/06/18 1944 06/07/18 0424 06/07/18 0848  TROPONINI 0.35* 1.06* 1.03*    Recent Labs  Lab 06/06/18 1854  TROPIPOC 0.21*     BNPNo results for input(s): BNP, PROBNP in the last 168 hours.   DDimer No results for input(s): DDIMER in the last 168 hours.   Radiology    No results found.  Cardiac Studies   Cath without significant disease  Patient Profile     77 y.o. female with chest pain.  S/p cath- no CAD.   Assessment & Plan    1) Chest pain: Per Dr. Sallyanne Kuster, PE w/u is needed.  WIll check D dimer.  If negative, this is very reassuring that PE is unlikely.  If w/u is negative, consider discharge later today.   2) Atorvastatin started for NSTEMI.  Troponin positive and RWMA noted on echo. WIll decrease dose given LDL of 129.  WIll need recheck  of LFTs and lipids in 6-8 weeks. Decrease to lipitor 40 mg daily.   3) PAH with right heart changes noted per Dr. Loletha Grayer. May need sleep study.    For questions or updates, please contact [place] Please consult [email or web address] for contact info under        Signed, Larae Grooms, MD  06/09/2018, 10:04 AM

## 2018-06-09 NOTE — Progress Notes (Signed)
Lab tech made RN aware that Pt would not permit the Lab tech to draw blood from hand. The Lab tech made one attempted to stick and draw labs from pt's left [facility] and was unsuccessful and the pt refused and more attempts for blood draws. Pt was made educated on why the lab work was needed. The pt continued to refuse the lab work.

## 2018-06-09 NOTE — Care Management Important Message (Signed)
Important Message  Patient Details  Name: Karen Owens MRN: [id number] Date of Birth: 25-Mar-1941   Medicare Important Message Given:  Yes    Jani Ploeger P [place] 06/09/2018, 2:05 PM

## 2018-06-09 NOTE — Progress Notes (Signed)
   D-dimer 9.11. Unable to get CT chest due to loss of IV access in A/C. Still has hand IV. Will get VQ scan. Will start IV heparin without bolus as pt had cath today.  Discussed with On call attending, Dr. Radford Pax.  Discussed plans with pt and she is agreeable.   Daune Perch, AGNP-C [facility] 06/09/2018  8:11 PM Pager: [phone]

## 2018-06-09 NOTE — Interval H&P Note (Signed)
Cath Lab Visit (complete for each Cath Lab visit)  Clinical Evaluation Leading to the Procedure:   ACS: Yes.    Non-ACS:    Anginal Classification: CCS III  Anti-ischemic medical therapy: Minimal Therapy (1 class of medications)  Non-Invasive Test Results: No non-invasive testing performed  Prior CABG: No previous CABG      History and Physical Interval Note:  06/09/2018 7:42 AM  Karen Owens  has presented today for surgery, with the diagnosis of NSTEMI  The various methods of treatment have been discussed with the patient and family. After consideration of risks, benefits and other options for treatment, the patient has consented to  Procedure(s): LEFT HEART CATH AND CORONARY ANGIOGRAPHY (N/A) as a surgical intervention .  The patient's history has been reviewed, patient examined, no change in status, stable for surgery.  I have reviewed the patient's chart and labs.  Questions were answered to the patient's satisfaction.     Quay Burow

## 2018-06-10 ENCOUNTER — Inpatient Hospital Stay (HOSPITAL_COMMUNITY): Payer: Medicare Other

## 2018-06-10 DIAGNOSIS — I82409 Acute embolism and thrombosis of unspecified deep veins of unspecified lower extremity: Secondary | ICD-10-CM

## 2018-06-10 DIAGNOSIS — I2699 Other pulmonary embolism without acute cor pulmonale: Secondary | ICD-10-CM

## 2018-06-10 DIAGNOSIS — R7989 Other specified abnormal findings of blood chemistry: Secondary | ICD-10-CM

## 2018-06-10 DIAGNOSIS — E785 Hyperlipidemia, unspecified: Secondary | ICD-10-CM

## 2018-06-10 LAB — CBC
HEMATOCRIT: 33 % — AB (ref 36.0–46.0)
Hemoglobin: 10.7 g/dL — ABNORMAL LOW (ref 12.0–15.0)
MCH: 31.4 pg (ref 26.0–34.0)
MCHC: 32.4 g/dL (ref 30.0–36.0)
MCV: 96.8 fL (ref 78.0–100.0)
Platelets: 159 10*3/uL (ref 150–400)
RBC: 3.41 MIL/uL — ABNORMAL LOW (ref 3.87–5.11)
RDW: 12.9 % (ref 11.5–15.5)
WBC: 10 10*3/uL (ref 4.0–10.5)

## 2018-06-10 LAB — HEPARIN LEVEL (UNFRACTIONATED)
HEPARIN UNFRACTIONATED: 0.14 [IU]/mL — AB (ref 0.30–0.70)
HEPARIN UNFRACTIONATED: 0.42 [IU]/mL (ref 0.30–0.70)

## 2018-06-10 MED ORDER — ATORVASTATIN CALCIUM 80 MG PO TABS: 80.0000 mg | ORAL_TABLET | Freq: Every day | ORAL | 4 refills | Status: DC

## 2018-06-10 MED ORDER — APIXABAN 5 MG PO TABS: 5.0000 mg | ORAL_TABLET | Freq: Two times a day (BID) | ORAL | Status: DC

## 2018-06-10 MED ORDER — IOPAMIDOL (ISOVUE-370) INJECTION 76%: 100.0000 mL | Freq: Once | INTRAVENOUS | Status: DC

## 2018-06-10 MED ORDER — NITROGLYCERIN 0.4 MG SL SUBL: 0.4000 mg | SUBLINGUAL_TABLET | SUBLINGUAL | 0 refills | Status: DC | PRN

## 2018-06-10 MED ORDER — APIXABAN 5 MG PO TABS
10.0000 mg | ORAL_TABLET | Freq: Two times a day (BID) | ORAL | Status: DC
  Administered 2018-06-10: 10 mg via ORAL
  Filled 2018-06-10: qty 2

## 2018-06-10 MED ORDER — IOPAMIDOL (ISOVUE-370) INJECTION 76%
INTRAVENOUS | Status: AC
  Filled 2018-06-10: qty 100

## 2018-06-10 MED ORDER — ASPIRIN 81 MG PO TBEC: 81.0000 mg | DELAYED_RELEASE_TABLET | Freq: Every day | ORAL | Status: DC

## 2018-06-10 MED ORDER — APIXABAN 5 MG PO TABS: 10.0000 mg | ORAL_TABLET | Freq: Two times a day (BID) | ORAL | 0 refills | Status: DC

## 2018-06-10 MED ORDER — APIXABAN 5 MG PO TABS: 5.0000 mg | ORAL_TABLET | Freq: Two times a day (BID) | ORAL | 3 refills | Status: DC

## 2018-06-10 MED ORDER — METOPROLOL TARTRATE 25 MG PO TABS: 12.5000 mg | ORAL_TABLET | Freq: Two times a day (BID) | ORAL | 4 refills | Status: DC

## 2018-06-10 NOTE — Discharge Summary (Addendum)
Discharge Summary    Patient ID: Karen Owens MRN: [id number]; DOB: 1941-01-09  Admit date: 06/06/2018 Discharge date: 06/10/2018  Primary Care Provider: Harlan Stains, MD  Primary Cardiologist: Sanda Klein, MD   Discharge Diagnoses    Principal Problem:   DVT (deep venous thrombosis) ([place]) Active Problems:   ELEVATED BLOOD PRESSURE WITHOUT DIAGNOSIS OF HYPERTENSION   NSTEMI (non-ST elevated myocardial infarction) ([place])   Pulmonary embolism (H[facility])   HLD (hyperlipidemia)  Allergies Allergies  Allergen Reactions  . Tuberculin Tests Hives   Diagnostic Studies/Procedures    Cardiac catheterization 06/09/2018: IMPRESSION: Karen Owens has essentially normal coronary arteries and a codominant system with an LVEDP of 18.  Medical therapy will be recommended for "noncardiac chest pain" although coronary vasospasm could also be a possibility.  The patient does admit to a lot of recent stress.  The sheath was removed and a TR band was placed on the right wrist to achieve patent hemostasis.  The patient left the lab in stable condition.  She can probably be discharged home later this morning.  Echocardiogram 06/07/2018: Study Conclusions  - Left ventricle: The cavity size was normal. Wall thickness was   increased in a pattern of mild LVH. Systolic function was normal.   The estimated ejection fraction was in the range of 50% to 55%.   Possible hypokinesis of the mid-apicalinferolateral myocardium;   consistent with ischemia in the distribution of the left   circumflex coronary artery. Doppler parameters are consistent   with abnormal left ventricular relaxation (grade 1 diastolic   dysfunction). - Ventricular septum: The contour showed diastolic flattening.   These changes are consistent with RV volume overload. - Mitral valve: Calcified annulus. - Right ventricle: The cavity size was mildly dilated. - Right atrium: The atrium was mildly dilated. - Atrial septum: The septum bowed  from right to left, consistent   with increased right atrial pressure. - Tricuspid valve: There was mild-moderate regurgitation directed   centrally. - Pulmonary arteries: Systolic pressure was mildly increased. PA   peak pressure: 44 mm Hg (S).  History of Present Illness     Karen Owens is a 77 y.o. female with history of hyperlipidemia, who presented on 06/06/18 for chest pain. Patient had an episode of chest discomfort on Sunday evening that occurred while at rest and lasted approximately 20 to 30 minutes which was accompanied by significant shortness of breath and nausea. This resolved without intervention. The patient was doing well until day of admission when she had another similar episode of chest discomfort that lasted over 30 minutes. The patient presented to the ED for further evaluation. Shortly after arrival to the ED and receiving full dose aspirin, the pain subsided. Her initial vital signs were in normal limits. Her ECG showed inferior Q waves without any ST elevations or other acute ischemic changes. She was started on heparin drip and admitted to the Cardiology service for further management.  Plan was made for cardiac catheterization scheduled for 06/09/2018.  Hospital Course     On 06/09/2018 patient was taken to the cardiac cath lab which revealed essentially have normal coronary arteries with recommendations for noncardiac chest pain although coronary vasospasm could be a possibility.  Troponin is peaked at 1.06 on 06/07/2018. Echocardiogram completed 06/07/2018 during hospital admission showed LVEF of 50 to 55% with possible hypokinesis of the mid apical apicalinferolateral myocardium; consistent with ischemia in the distribution of the left circumflex coronary artery and grade 1 diastolic dysfunction. Pt placed on risk  reduction therapy with ASA, statin, and BB.   Given the above, patient was found to have an elevated d-dimer at 9.11.  She was evaluated with a VQ scan pleated  on 06/09/2018 which was shown to be intermediate, showing a single moderate sized perfusion defect without corresponding ventilation or radiographic abnormality.  Therefore, recommendations were for CTA. Unfortunately this was complicated by loss of IV access and patient refusal.  She underwent a lower extremity venous Doppler which was positive for left lower extremity acute DVT in the femoral vein and popliteal vein.  There was no evidence of Baker's cyst bilaterally.  Given this, patient will be placed on Eliquis DVT/PE dosing for 6 months and has follow-up with Dr. Gwenlyn Found on 06/20/2018 at 8:30 AM.  Consultants: None  Patient has been seen and examined by Dr. Irish Lack feels that she is stable and ready for discharge on 06/10/2018.  Spoke with pharmacist regarding DVT/PE dosing for Eliquis.  Recommendations are for 10 mg PO twice daily x7 days then 5 mg PO twice daily for 6 months duration or 3 months if known etiology.   Discharge Vitals Blood pressure 137/84, pulse 70, temperature 97.9 F (36.6 C), temperature source Oral, resp. rate 18, height 5\' 6"  (1.676 m), weight 104.7 kg, SpO2 94 %.  Filed Weights   06/06/18 1756 06/06/18 2331 06/08/18 2123  Weight: 99.8 kg 104.1 kg 104.7 kg   Labs & Radiologic Studies    CBC Recent Labs    06/09/18 0606 06/10/18 0409  WBC 9.2 10.0  HGB 11.1* 10.7*  HCT 34.0* 33.0*  MCV 96.6 96.8  PLT 157 159   D-Dimer Recent Labs    06/09/18 0606  DDIMER 9.11*  _____________  Dg Chest 2 View  Result Date: 06/09/2018 CLINICAL DATA:  V/Q scan EXAM: CHEST - 2 VIEW COMPARISON:  06/06/2018 FINDINGS: Mild right basilar opacity, likely atelectasis. Left lung is clear. No pleural effusion or pneumothorax. The heart is normal in size. Degenerative changes of the visualized thoracolumbar spine. IMPRESSION: Mild right basilar opacity, likely atelectasis. Electronically Signed   By: Julian Hy M.D.   On: 06/09/2018 22:17   Dg Chest 2 View  Result Date:  06/06/2018 CLINICAL DATA:  Central chest pain EXAM: CHEST - 2 VIEW COMPARISON:  08/15/2012 FINDINGS: Lungs are clear.  No pleural effusion or pneumothorax. The heart is top-normal in size. Degenerative changes of the lower thoracic spine. IMPRESSION: No evidence of acute cardiopulmonary disease. Electronically Signed   By: Julian Hy M.D.   On: 06/06/2018 19:12   Nm Pulmonary Perf And Vent  Result Date: 06/09/2018 CLINICAL DATA:  Chest pressure, weakness, shortness of breath. Elevated D-dimer. EXAM: NUCLEAR MEDICINE VENTILATION - PERFUSION LUNG SCAN TECHNIQUE: Ventilation images were obtained in multiple projections using inhaled aerosol Tc-92m DTPA. Perfusion images were obtained in multiple projections after intravenous injection of Tc-32m-MAA. RADIOPHARMACEUTICALS:  29.4 mCi of Tc-65m DTPA aerosol inhalation and 3.74 mCi Tc83m-MAA IV COMPARISON:  Correlation with chest radiograph dated 06/09/2018 FINDINGS: Ventilation: No focal ventilation defect. Perfusion: Single moderate-sized wedge-shaped defect in the right anterolateral mid lung. Lungs are clear on corresponding chest radiograph. IMPRESSION: Single moderate-sized perfusion defect without corresponding ventilation or radiographic abnormality. By definition, this reflects intermediate probability for pulmonary embolism (PIOPED II criteria). Electronically Signed   By: Julian Hy M.D.   On: 06/09/2018 22:17   Disposition   Pt is being discharged home today in good condition.  Follow-up Plans & Appointments    Follow-up [place],  Pearletha Forge, MD Follow up on 06/20/2018.   Specialties:  Cardiology, Radiology Why:  Your follow-up appointment will be on 06/20/2018 with Dr. Gwenlyn Found at 8:30 AM Contact information: [address] [place] [address] [phone]          Discharge Instructions    Call MD for:  difficulty breathing, headache or visual disturbances   Complete by:  As directed    Call  MD for:  extreme fatigue   Complete by:  As directed    Call MD for:  hives   Complete by:  As directed    Call MD for:  persistant dizziness or light-headedness   Complete by:  As directed    Call MD for:  persistant nausea and vomiting   Complete by:  As directed    Call MD for:  redness, tenderness, or signs of infection (pain, swelling, redness, odor or green/yellow discharge around incision site)   Complete by:  As directed    Call MD for:  severe uncontrolled pain   Complete by:  As directed    Call MD for:  temperature >100.4   Complete by:  As directed    Diet - low sodium heart healthy   Complete by:  As directed    Discharge instructions   Complete by:  As directed    You will take 10mg  of Eliquis twice daily until 06/17/18, then you will take 5 mg twice daily until you are instructed by your doctor.   No driving for 3 days. No lifting over 5 lbs for 1 week. No sexual activity for 1 week. Keep procedure site clean & dry. If you notice increased pain, swelling, bleeding or pus, call/return!  You may shower, but no soaking baths/hot tubs/pools for 1 week.    If you notice any bleeding such as blood in stool, black tarry stools, blood in urine, nosebleeds or any other unusual bleeding, call your doctor immediately.   Increase activity slowly   Complete by:  As directed       Discharge Medications   Allergies as of 06/10/2018      Reactions   Tuberculin Tests Hives      Medication List    TAKE these medications   apixaban 5 MG Tabs tablet Commonly known as:  ELIQUIS Take 2 tablets (10 mg total) by mouth 2 (two) times daily. Until 06/17/18 Start taking on:  06/11/2018   apixaban 5 MG Tabs tablet Commonly known as:  ELIQUIS Take 1 tablet (5 mg total) by mouth 2 (two) times daily. Until 12/2017 Start taking on:  06/17/2018   BEE POLLEN PO Take 2 capsules by mouth 2 (two) times daily with breakfast and lunch.   HYDROcodone-acetaminophen 10-325 MG tablet Commonly  known as:  NORCO Take 1-2 tablets by mouth every 4 (four) hours as needed for pain (breakthrough pain).   ibuprofen 800 MG tablet Commonly known as:  ADVIL,MOTRIN Take 800 mg by mouth daily as needed (inflammation/pain).   IMMUNOCAL PO Take 1 packet by mouth daily. Mix in juice and drink   methocarbamol 500 MG tablet Commonly known as:  ROBAXIN Take 1 tablet (500 mg total) by mouth every 6 (six) hours as needed (spasms).   PRESCRIPTION MEDICATION Take 4 tablets by mouth See admin instructions. Take 4 tablets (antibiotic) by mouth one hour prior to dental appointment   RESTASIS MULTIDOSE 0.05 % ophthalmic emulsion Generic drug:  cycloSPORINE Place 1 drop into both eyes 2 (two) times daily as  needed (dry eyes).        Acute coronary syndrome (MI, NSTEMI, STEMI, etc) this admission?:  No.  The elevated Troponin was due to the acute medical illness or demand ischemia.    Outstanding Labs/Studies   LFTs and lipid panel in 6-8 weeks  Duration of Discharge Encounter   Greater than 30 minutes including physician time.  Signed, Kathyrn Drown, NP 06/10/2018, 6:50 PM   I have examined the patient and reviewed assessment and plan and discussed with patient.  Agree with above as stated.  Please see my note from earlier today.  Positive troponin.  Clean cath so PE w/u done.  Intermediate V/Q with positive LE Doppler for DVT.  Plan to treat for 6 months with Eliquis.  High dose initially and then 5 mg BID. Since she did not have significant CAD, will stop aspirin.  Statin not needed for secondary prevention as her troponin was more likely from RV strain from the PE.  F/u arranged at [facility] for 10/11.   Kathyrn Drown

## 2018-06-10 NOTE — Progress Notes (Signed)
*  Preliminary Results* Bilateral lower extremity venous duplex completed. Right lower extremity is negative for deep vein thrombosis.  Left lower extremity is positive for acute deep vein thrombosis in the femoral vein and the popliteal vein. There is no evidence of Baker's cyst bilaterally.  06/10/2018 3:00 PM Karen Owens

## 2018-06-10 NOTE — Progress Notes (Addendum)
Progress Note  Patient Name: Karen Owens Date of Encounter: 06/10/2018  Primary Cardiologist: Sanda Klein, MD   Subjective   No chest pain or shortness of breath. Intermediate risk V/Q Scan.   Inpatient Medications    Scheduled Meds: . aspirin EC  81 mg Oral Daily  . atorvastatin  80 mg Oral q1800  . metoprolol tartrate  12.5 mg Oral Q6H  . sodium chloride flush  3 mL Intravenous Q12H   Continuous Infusions: . sodium chloride    . heparin 1,150 Units/hr (06/10/18 0550)   PRN Meds: sodium chloride, acetaminophen, alum & mag hydroxide-simeth, cycloSPORINE, morphine injection, nitroGLYCERIN, ondansetron (ZOFRAN) IV, sodium chloride flush   Vital Signs    Vitals:   06/09/18 2238 06/09/18 2357 06/10/18 0443 06/10/18 0607  BP: (!) 148/70 (!) 143/71 (!) 145/64 130/73  Pulse: 70 75 76 71  Resp:  (!) 21 (!) 29   Temp:  99 F (37.2 C) 98.9 F (37.2 C)   TempSrc:  Oral Oral   SpO2:  95% 95%   Weight:      Height:        Intake/Output Summary (Last 24 hours) at 06/10/2018 0817 Last data filed at 06/10/2018 0400 Gross per 24 hour  Intake 1193 ml  Output -  Net 1193 ml   Filed Weights   06/06/18 1756 06/06/18 2331 06/08/18 2123  Weight: 99.8 kg 104.1 kg 104.7 kg    Telemetry    NSR - Personally Reviewed  ECG    None recent  Physical Exam   GEN: No acute distress.   Neck: No JVD Cardiac: RRR, no murmurs, rubs, or gallops.  Respiratory: Clear to auscultation bilaterally. GI: Soft, nontender, non-distended  MS: No edema; No deformity.  Neuro:  Nonfocal  Psych: Normal affect   Labs    Chemistry Recent Labs  Lab 06/06/18 1849 06/07/18 0848  NA 142 139  K 3.4* 3.8  CL 108 107  CO2 23 22  GLUCOSE 121* 101*  BUN 16 13  CREATININE 0.90 0.85  CALCIUM 10.4* 10.1  GFRNONAA >60 >60  GFRAA >60 >60  ANIONGAP 11 10     Hematology Recent Labs  Lab 06/08/18 0217 06/09/18 0606 06/10/18 0409  WBC 9.3 9.2 10.0  RBC 3.55* 3.52* 3.41*  HGB 11.0*  11.1* 10.7*  HCT 33.9* 34.0* 33.0*  MCV 95.5 96.6 96.8  MCH 31.0 31.5 31.4  MCHC 32.4 32.6 32.4  RDW 12.7 12.8 12.9  PLT 165 157 159    Cardiac Enzymes Recent Labs  Lab 06/06/18 1944 06/07/18 0424 06/07/18 0848  TROPONINI 0.35* 1.06* 1.03*    Recent Labs  Lab 06/06/18 1854  TROPIPOC 0.21*     BNPNo results for input(s): BNP, PROBNP in the last 168 hours.   DDimer  Recent Labs  Lab 06/09/18 0606  DDIMER 9.11*     Radiology    Dg Chest 2 View  Result Date: 06/09/2018 CLINICAL DATA:  V/Q scan EXAM: CHEST - 2 VIEW COMPARISON:  06/06/2018 FINDINGS: Mild right basilar opacity, likely atelectasis. Left lung is clear. No pleural effusion or pneumothorax. The heart is normal in size. Degenerative changes of the visualized thoracolumbar spine. IMPRESSION: Mild right basilar opacity, likely atelectasis. Electronically Signed   By: Julian Hy M.D.   On: 06/09/2018 22:17   Nm Pulmonary Perf And Vent  Result Date: 06/09/2018 CLINICAL DATA:  Chest pressure, weakness, shortness of breath. Elevated D-dimer. EXAM: NUCLEAR MEDICINE VENTILATION - PERFUSION LUNG SCAN TECHNIQUE: Ventilation images  were obtained in multiple projections using inhaled aerosol Tc-18m DTPA. Perfusion images were obtained in multiple projections after intravenous injection of Tc-56m-MAA. RADIOPHARMACEUTICALS:  29.4 mCi of Tc-3m DTPA aerosol inhalation and 3.74 mCi Tc64m-MAA IV COMPARISON:  Correlation with chest radiograph dated 06/09/2018 FINDINGS: Ventilation: No focal ventilation defect. Perfusion: Single moderate-sized wedge-shaped defect in the right anterolateral mid lung. Lungs are clear on corresponding chest radiograph. IMPRESSION: Single moderate-sized perfusion defect without corresponding ventilation or radiographic abnormality. By definition, this reflects intermediate probability for pulmonary embolism (PIOPED II criteria). Electronically Signed   By: Julian Hy M.D.   On: 06/09/2018 22:17     Cardiac Studies   Cath without significant disease  Patient Profile     77 y.o. female with chest pain.  S/p cath- no CAD.   Assessment & Plan    1) Chest pain: Clean Cath. PE workup with D-dimer 9.11. CTA was ordered, but patient declined after issues with IV; V/Q scan was done, but was intermediate risk. No provoking factors and patient agreeable to CTA today. - IV team for second IV placement - CTA - LE ultrasound to evaluate for DVT   2) Atorvastatin started for ?NSTEMI. Lipitor dose decreased given LDL of 129. - Recheck of LFTs and lipids in 6-8 weeks. - Atorvastatin 40mg  Daily  3) PAH with right heart changes noted. May need sleep study, especially if CTA negative.      For questions or updates, please contact [place] Please consult [email or web address] for contact info under     Signed, Neva Seat, MD  06/10/2018, 8:17 AM    I have examined the patient and reviewed assessment and plan and discussed with patient.  Agree with above as stated.  Will plan for CTA to eval for PE.  Explained rationale for CT and determining anticoagulation treatment/duration.  PAH as noted above.   Larae Grooms

## 2018-06-10 NOTE — Progress Notes (Signed)
ANTICOAGULATION CONSULT NOTE  Pharmacy Consult for Heparin  Indication: Pulmonary Embolus  Heparin Dosing Weight: 81.8 kg  Labs: Recent Labs    06/07/18 0848 06/08/18 0217 06/09/18 0606 06/10/18 0409  HGB 11.9* 11.0* 11.1* 10.7*  HCT 36.4 33.9* 34.0* 33.0*  PLT 152 165 157 159  LABPROT 14.9  --   --   --   INR 1.17  --   --   --   HEPARINUNFRC  --  0.43 0.44 0.14*  CREATININE 0.85  --   --   --   TROPONINI 1.03*  --   --   --     Assessment: 77 yof presenting with CP. Pharmacy consulted to dose heparin for ACS. Not on anticoagulation PTA.   LHC showed normal coronary arteries. D-dimer positive this morning, pharmacy consulted to resume IV heparin for possible PE with no bolus.   10/1 AM update: VQ scan with intermediate probability for PE, heparin level is low after re-start s/p cath, no issues per RN.   Goal of Therapy:  Heparin level 0.3-0.7 units/ml Monitor platelets by anticoagulation protocol: Yes   Plan:  -Inc heparin to 1150 units/hr -1400 HL -Monitor s/sx bleeding  Narda Bonds, PharmD, BCPS Clinical Pharmacist Phone: [phone]

## 2018-06-10 NOTE — Progress Notes (Addendum)
ANTICOAGULATION CONSULT NOTE  Pharmacy Consult for heparin Indication: chest pain/ACS  Heparin Dosing Weight: 81.8 kg  Labs: Recent Labs    06/07/18 0848 06/08/18 0217 06/09/18 0606 06/10/18 0409  HGB 11.9* 11.0* 11.1* 10.7*  HCT 36.4 33.9* 34.0* 33.0*  PLT 152 165 157 159  LABPROT 14.9  --   --   --   INR 1.17  --   --   --   HEPARINUNFRC  --  0.43 0.44 0.14*  CREATININE 0.85  --   --   --   TROPONINI 1.03*  --   --   --     Assessment: 46 yof presenting with CP. Pharmacy consulted to dose heparin for ACS. Not on anticoagulation PTA.   LHC showed normal coronary arteries. D-dimer positive this morning, pharmacy consulted to resume IV heparin for possible PE with no bolus. Previously therapeutic at 1000 units/hr.  Heparin level at goal this afternoon at 0.4. No bleeding issues noted overnight. CBC stable.  VQ intermediate probability of PE, CTA ordered for today.   Goal of Therapy:  Heparin level 0.3-0.7 units/ml Monitor platelets by anticoagulation protocol: Yes   Plan:  Continue heparin at 1150 units/hr -Daily heparin level/CBC -Monitor s/sx bleeding  Erin Hearing PharmD., BCPS Clinical Pharmacist 06/10/2018 8:11 AM   Addendum:  Dopplers positive for DVT - orders from cardiology to change to apixaban.   06/10/2018 3:19 PM

## 2018-06-11 ENCOUNTER — Encounter: Payer: Self-pay | Admitting: *Deleted

## 2018-06-11 NOTE — Progress Notes (Unsigned)
Pt discharged after hours 06/10/18 on Eliquis - medication reconciliation was still incomplete prior to close of business for [facility] department.  CM contacted Pharmacy this am  of choice - pt picked up her Eliquis this am - copay was $45.

## 2018-06-20 ENCOUNTER — Ambulatory Visit: Payer: Medicare Other | Admitting: Cardiovascular Disease

## 2018-06-20 ENCOUNTER — Encounter

## 2018-06-20 ENCOUNTER — Encounter: Payer: Self-pay | Admitting: Cardiovascular Disease

## 2018-06-20 VITALS — BP 144/86 | HR 80 | Ht 66.0 in | Wt 226.4 lb

## 2018-06-20 DIAGNOSIS — I82403 Acute embolism and thrombosis of unspecified deep veins of lower extremity, bilateral: Secondary | ICD-10-CM

## 2018-06-20 DIAGNOSIS — I2693 Single subsegmental pulmonary embolism without acute cor pulmonale: Secondary | ICD-10-CM

## 2018-06-20 DIAGNOSIS — E782 Mixed hyperlipidemia: Secondary | ICD-10-CM

## 2018-06-20 NOTE — Assessment & Plan Note (Signed)
History of mild hyperlipidemia recent blood work performed 05/30/2018 revealing LDL of 129.  Since that time she is adopted a more heart healthy diet.  We will recheck lipid liver profile in 3 months.

## 2018-06-20 NOTE — Progress Notes (Signed)
06/20/2018 Karen Owens   1940/11/25  [id number]  Primary Physician Harlan Stains, MD Primary Cardiologist: Lorretta Harp MD Lupe Carney, [place]  HPI:  Karen Owens is a 77 y.o. moderately overweight divorced African-American female mother of 6 children, grandmother of 11 grandchildren who is being seen for post hospital follow-up.  Her primary care provider is Dr. Harlan Stains.  She is a retired Theatre manager.  Risk factors are only of remote tobacco abuse.  She was admitted with chest pain from 9/27 through 10/1.  Troponin rose to approximately 1.  Her echo did show anteroapical wall motion abnormality.  I performed radial diagnostic catheter revealed essentially normal coronary arteries.  Subsequent d-dimer was positive at around 9 with a VQ scan was intermediate probability and subsequent venous Dopplers that showed acute left lower extremity DVT for which she was placed on Eliquis oral anticoagulation.  She is had no further symptoms since discharge.   Current Meds  Medication Sig  . apixaban (ELIQUIS) 5 MG TABS tablet Take 1 tablet (5 mg total) by mouth 2 (two) times daily. Until 12/2017  . BEE POLLEN PO Take 2 capsules by mouth 2 (two) times daily with breakfast and lunch.  . cycloSPORINE (RESTASIS MULTIDOSE) 0.05 % ophthalmic emulsion Place 1 drop into both eyes 2 (two) times daily as needed (dry eyes).  Marland Kitchen HYDROcodone-acetaminophen (NORCO) 10-325 MG per tablet Take 1-2 tablets by mouth every 4 (four) hours as needed for pain (breakthrough pain).  Marland Kitchen ibuprofen (ADVIL,MOTRIN) 800 MG tablet Take 800 mg by mouth daily as needed (inflammation/pain).   . methocarbamol (ROBAXIN) 500 MG tablet Take 1 tablet (500 mg total) by mouth every 6 (six) hours as needed (spasms).  . nitroGLYCERIN (NITROSTAT) 0.4 MG SL tablet Place 1 tablet under the tongue every 5 (five) minutes x 3 doses as needed.  . Nutritional Supplements (IMMUNOCAL PO) Take 1 packet by mouth daily. Mix in juice and  drink  . PRESCRIPTION MEDICATION Take 4 tablets by mouth See admin instructions. Take 4 tablets (antibiotic) by mouth one hour prior to dental appointment     Allergies  Allergen Reactions  . Tuberculin Tests Hives    Social History   Socioeconomic History  . Marital status: Divorced    Spouse name: Not on file  . Number of children: Not on file  . Years of education: Not on file  . Highest education level: Not on file  Occupational History  . Not on file  Social Needs  . Financial resource strain: Not on file  . Food insecurity:    Worry: Not on file    Inability: Not on file  . Transportation needs:    Medical: Not on file    Non-medical: Not on file  Tobacco Use  . Smoking status: Former Smoker    Last attempt to quit: 09/10/1982    Years since quitting: 35.8  . Smokeless tobacco: Never Used  Substance and Sexual Activity  . Alcohol use: Yes    Comment: occasional  . Drug use: No  . Sexual activity: Never    Birth control/protection: Post-menopausal  Lifestyle  . Physical activity:    Days per week: Not on file    Minutes per session: Not on file  . Stress: Not on file  Relationships  . Social connections:    Talks on phone: Not on file    Gets together: Not on file    Attends religious service: Not on file  Active member of club or organization: Not on file    Attends meetings of clubs or organizations: Not on file    Relationship status: Not on file  . Intimate partner violence:    Fear of current or ex partner: Not on file    Emotionally abused: Not on file    Physically abused: Not on file    Forced sexual activity: Not on file  Other Topics Concern  . Not on file  Social History Narrative  . Not on file     Review of Systems: General: negative for chills, fever, night sweats or weight changes.  Cardiovascular: negative for chest pain, dyspnea on exertion, edema, orthopnea, palpitations, paroxysmal nocturnal dyspnea or shortness of  breath Dermatological: negative for rash Respiratory: negative for cough or wheezing Urologic: negative for hematuria Abdominal: negative for nausea, vomiting, diarrhea, bright red blood per rectum, melena, or hematemesis Neurologic: negative for visual changes, syncope, or dizziness All other systems reviewed and are otherwise negative except as noted above.    Blood pressure (!) 144/86, pulse 80, height 5\' 6"  (1.676 m), weight 226 lb 6.4 oz (102.7 kg).  General appearance: alert and no distress Neck: no adenopathy, no carotid bruit, no JVD, supple, symmetrical, trachea midline and thyroid not enlarged, symmetric, no tenderness/mass/nodules Lungs: clear to auscultation bilaterally Heart: regular rate and rhythm, S1, S2 normal, no murmur, click, rub or gallop Extremities: extremities normal, atraumatic, no cyanosis or edema Pulses: 2+ and symmetric Skin: Skin color, texture, turgor normal. No rashes or lesions Neurologic: Alert and oriented X 3, normal strength and tone. Normal symmetric reflexes. Normal coordination and gait  EKG not performed today  ASSESSMENT AND PLAN:   NSTEMI (non-ST elevated myocardial infarction) ([place]) Recent admission for non-STEMI with troponin that rose to approximately 1 and subsequent radial diagnostic catheter showed clean coronary arteries.  Pulmonary embolism ([place]) Recent admission for chest pain 9/27 through 10/1.  The Q scan was intermediate probability and a lower extremity venous Doppler study did show acute DVT in the left deep femoral and popliteal veins.  She was placed on Eliquis.  Her chest pain or shortness of breath have subsequently resolved.  I am going to keep her on oral intake regulation for at least 3 months to recheck venous Doppler studies.  There were no instigating causes identified.  HLD (hyperlipidemia) History of mild hyperlipidemia recent blood work performed 05/30/2018 revealing LDL of 129.  Since that time she is adopted a more  heart healthy diet.  We will recheck lipid liver profile in 3 months.      Lorretta Harp MD FACP,FACC,FAHA, [facility] 06/20/2018 8:52 AM

## 2018-06-20 NOTE — Assessment & Plan Note (Signed)
Recent admission for chest pain 9/27 through 10/1.  The Q scan was intermediate probability and a lower extremity venous Doppler study did show acute DVT in the left deep femoral and popliteal veins.  She was placed on Eliquis.  Her chest pain or shortness of breath have subsequently resolved.  I am going to keep her on oral intake regulation for at least 3 months to recheck venous Doppler studies.  There were no instigating causes identified.

## 2018-06-20 NOTE — Patient Instructions (Signed)
Medication Instructions:  Your physician recommends that you continue on your current medications as directed. Please refer to the Current Medication list given to you today.  If you need a refill on your cardiac medications before your next appointment, please call your pharmacy.   Lab work: Your physician recommends that you return for lab work in: [place]  If you have labs (blood work) drawn today and your tests are completely normal, you will receive your results only by: Marland Kitchen MyChart Message (if you have MyChart) OR . A paper copy in the mail If you have any lab test that is abnormal or we need to change your treatment, we will call you to review the results.  Testing/Procedures:  Your physician has requested that you have a lower venous duplex. This test is an ultrasound of the veins in the legs or arms. It looks at venous blood flow that carries blood from the heart to the legs or arms. Allow one hour for a Lower Venous exam. Allow thirty minutes for an Upper Venous exam. There are no restrictions or special instructions.  SCHEDULE IN 3 MONTHS   Follow-Up: At [facility], you and your health needs are our priority.  As part of our continuing mission to provide you with exceptional heart care, we have created designated Provider Care Teams.  These Care Teams include your primary Cardiologist (physician) and Advanced Practice Providers (APPs -  Physician Assistants and Nurse Practitioners) who all work together to provide you with the care you need, when you need it. You will need a follow up appointment in 4 months.  Please call our office 2 months in advance to schedule this appointment.  You may see Dr. Gwenlyn Found or one of the following Advanced Practice Providers on your designated Care Team:   [name], PA-C Roby Lofts, [place] . Sande Rives, PA-C  Any Other Special Instructions Will Be Listed Below (If Applicable).

## 2018-06-20 NOTE — Assessment & Plan Note (Signed)
Recent admission for non-STEMI with troponin that rose to approximately 1 and subsequent radial diagnostic catheter showed clean coronary arteries.

## 2018-07-09 ENCOUNTER — Telehealth: Payer: Self-pay | Admitting: Cardiovascular Disease

## 2018-07-09 MED ORDER — NITROGLYCERIN 0.4 MG SL SUBL: 0.4000 mg | SUBLINGUAL_TABLET | SUBLINGUAL | 3 refills | Status: AC | PRN

## 2018-07-09 NOTE — Telephone Encounter (Signed)
New message   Pt c/o medication issue:  1. Name of Medication: nitroGLYCERIN (NITROSTAT) 0.4 MG SL tablet  2. How are you currently taking this medication (dosage and times per day)? Take as needed   3. Are you having a reaction (difficulty breathing--STAT)? No   4. What is your medication issue?Patient has misplaced her nitroglycerin pills. Please advise.

## 2018-07-09 NOTE — Telephone Encounter (Signed)
Spoke with patient. She states she has misplaced her NTG pills. Advised I will send in new Rx to [facility]

## 2018-08-11 ENCOUNTER — Ambulatory Visit: Payer: Medicare Other | Admitting: Cardiology

## 2018-08-29 ENCOUNTER — Telehealth: Payer: Self-pay | Admitting: Cardiovascular Disease

## 2018-08-29 NOTE — Telephone Encounter (Signed)
SPOKE TO PATIENT -RN REVIEWED  WITH PHARMACIST--- OKAY TO HAVE  PEDICURE - DO NOT HAVE FOOT OR LEG MASSAGE.  PATIENT VERBALIZED UNDERSTADING

## 2018-08-29 NOTE — Telephone Encounter (Signed)
New message   Patient wants to know if she can have a pedicure and be on blood thinners? Please advise.

## 2018-09-30 ENCOUNTER — Ambulatory Visit (HOSPITAL_COMMUNITY)
Admission: RE | Admit: 2018-09-30 | Discharge: 2018-09-30 | Disposition: A | Discharge status: Home / Self Care | Payer: Medicare Other | Source: Ambulatory Visit | Attending: Cardiology | Admitting: Cardiology

## 2018-09-30 DIAGNOSIS — I82403 Acute embolism and thrombosis of unspecified deep veins of lower extremity, bilateral: Secondary | ICD-10-CM

## 2018-10-03 ENCOUNTER — Encounter (HOSPITAL_COMMUNITY): Payer: Self-pay

## 2018-10-03 ENCOUNTER — Other Ambulatory Visit: Payer: Self-pay

## 2018-10-03 ENCOUNTER — Emergency Department (HOSPITAL_COMMUNITY)
Admission: EM | Admit: 2018-10-03 | Discharge: 2018-10-04 | Disposition: A | Discharge status: Home / Self Care | Payer: Medicare Other | Attending: Emergency Medicine | Admitting: Emergency Medicine

## 2018-10-03 DIAGNOSIS — Z87891 Personal history of nicotine dependence: Secondary | ICD-10-CM | POA: Diagnosis not present

## 2018-10-03 DIAGNOSIS — J45909 Unspecified asthma, uncomplicated: Secondary | ICD-10-CM | POA: Diagnosis not present

## 2018-10-03 DIAGNOSIS — R5383 Other fatigue: Secondary | ICD-10-CM | POA: Insufficient documentation

## 2018-10-03 DIAGNOSIS — Z79899 Other long term (current) drug therapy: Secondary | ICD-10-CM | POA: Diagnosis not present

## 2018-10-03 DIAGNOSIS — Z96651 Presence of right artificial knee joint: Secondary | ICD-10-CM | POA: Diagnosis not present

## 2018-10-03 LAB — CBC
HCT: 37.6 % (ref 36.0–46.0)
HEMOGLOBIN: 12.2 g/dL (ref 12.0–15.0)
MCH: 31 pg (ref 26.0–34.0)
MCHC: 32.4 g/dL (ref 30.0–36.0)
MCV: 95.7 fL (ref 80.0–100.0)
NRBC: 0 % (ref 0.0–0.2)
Platelets: 222 10*3/uL (ref 150–400)
RBC: 3.93 MIL/uL (ref 3.87–5.11)
RDW: 12.7 % (ref 11.5–15.5)
WBC: 8.9 10*3/uL (ref 4.0–10.5)

## 2018-10-03 LAB — COMPREHENSIVE METABOLIC PANEL
ALBUMIN: 3.8 g/dL (ref 3.5–5.0)
ALT: 12 U/L (ref 0–44)
AST: 15 U/L (ref 15–41)
Alkaline Phosphatase: 71 U/L (ref 38–126)
Anion gap: 8 (ref 5–15)
BUN: 25 mg/dL — ABNORMAL HIGH (ref 8–23)
CHLORIDE: 111 mmol/L (ref 98–111)
CO2: 22 mmol/L (ref 22–32)
Calcium: 10 mg/dL (ref 8.9–10.3)
Creatinine, Ser: 1.08 mg/dL — ABNORMAL HIGH (ref 0.44–1.00)
GFR calc Af Amer: 58 mL/min — ABNORMAL LOW (ref >60)
GFR calc non Af Amer: 50 mL/min — ABNORMAL LOW (ref >60)
GLUCOSE: 98 mg/dL (ref 70–99)
POTASSIUM: 3.8 mmol/L (ref 3.5–5.1)
SODIUM: 141 mmol/L (ref 135–145)
Total Bilirubin: 0.8 mg/dL (ref 0.3–1.2)
Total Protein: 7 g/dL (ref 6.5–8.1)

## 2018-10-03 LAB — I-STAT TROPONIN, ED: Troponin i, poc: 0.01 ng/mL (ref 0.00–0.08)

## 2018-10-03 LAB — POC OCCULT BLOOD, ED: Fecal Occult Bld: NEGATIVE

## 2018-10-03 MED ORDER — SODIUM CHLORIDE 0.9 % IV BOLUS: 1000.0000 mL | Freq: Once | INTRAVENOUS | Status: DC

## 2018-10-03 NOTE — ED Triage Notes (Signed)
Pt here with fatigue and abdominal pain for the last 2 days.  Having dark stools for the last 2 days.  No nausea or vomiting. A&Ox4. Pt is on eliquis and ASA.

## 2018-10-03 NOTE — ED Provider Notes (Signed)
[facility] EMERGENCY DEPARTMENT Provider Note   CSN: [id number] Arrival date & time: 10/03/18  1949     History   Chief Complaint Chief Complaint  Patient presents with  . Abdominal Pain  . Fatigue    HPI Karen Owens is a 78 y.o. female.  The history is provided by the patient and medical records. No language interpreter was used.  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  Patient reports since yesterday she felt a bit fatigued, which include mild lightheadedness, mild vague abdominal discomfort as well as dark-colored stool.  She mentioned her symptom is mild, she does not normally notice any dark stools in the past.  She is on Eliquis for history of DVT.  She does not complain of any fever chills, headache, chest pain, shortness of breath, URI symptoms.  She does not complain of any back pain, dysuria, hematuria or rash.  She does takes baby aspirin along with Eliquis.  She denies any recent use of iron supplementation or Pepto-Bismol.  Past Medical History:  Diagnosis Date  . Arthritis   . Asthma   . Complication of anesthesia    difficulty waking up  . Refusal of blood transfusions as patient is Jehovah's Witness   . Thyroid nodule     Patient Active Problem List   Diagnosis Date Noted  . DVT (deep venous thrombosis) ([place]) 06/10/2018  . Pulmonary embolism ([place]) 06/10/2018  . HLD (hyperlipidemia) 06/10/2018  . NSTEMI (non-ST elevated myocardial infarction) ([place]) 06/06/2018  . FIBROIDS, UTERUS 12/11/2007  . ELEVATED BLOOD PRESSURE WITHOUT DIAGNOSIS OF HYPERTENSION 12/11/2007  . DENTAL CARIES 10/28/2007  . CALCANEAL SPUR, RIGHT 10/28/2007  . ASTHMA 10/13/2007  . GASTROENTERITIS, ACUTE 08/11/2007  . ANXIETY 07/02/2007  . ARTHRITIS, RIGHT SHOULDER 07/02/2007  . PAP SMEAR, LGSIL, ABNORMAL 05/27/2007  .  COLPOSCOPY WITH BIOPSY, HX OF 05/27/2007    Past Surgical History:  Procedure Laterality Date  . DILATION AND CURETTAGE OF UTERUS    . LEFT HEART CATH AND CORONARY ANGIOGRAPHY N/A 06/09/2018   Procedure: LEFT HEART CATH AND CORONARY ANGIOGRAPHY;  Surgeon: Lorretta Harp, MD;  Location: [facility];  Service: Cardiovascular;  Laterality: N/A;  . LIPOMA EXCISION     right arm  . TOTAL KNEE ARTHROPLASTY     left  . TOTAL KNEE ARTHROPLASTY  08/20/2012   right knee  . TOTAL KNEE ARTHROPLASTY  08/20/2012   Procedure: TOTAL KNEE ARTHROPLASTY;  Surgeon: Ninetta Lights, MD;  Location: [facility];  Service: Orthopedics;  Laterality: Right;     OB History   No obstetric history on file.      Home Medications    Prior to Admission medications   Medication Sig Start Date End Date Taking? Authorizing Provider  apixaban (ELIQUIS) 5 MG TABS tablet Take 1 tablet (5 mg total) by mouth 2 (two) times daily. Until 12/2017 06/17/18   Tommie Raymond, NP  BEE POLLEN PO Take 2 capsules by mouth 2 (two) times daily with breakfast and lunch.    [provider]  cycloSPORINE (RESTASIS MULTIDOSE) 0.05 % ophthalmic emulsion Place 1 drop into both eyes 2 (two) times daily as needed (dry eyes).    [provider]  HYDROcodone-acetaminophen (NORCO) 10-325 MG per tablet Take 1-2 tablets by mouth every 4 (four) hours as needed for pain (breakthrough pain). 08/22/12  Lanae Crumbly, PA-C  ibuprofen (ADVIL,MOTRIN) 800 MG tablet Take 800 mg by mouth daily as needed (inflammation/pain).  05/05/18   [provider]  methocarbamol (ROBAXIN) 500 MG tablet Take 1 tablet (500 mg total) by mouth every 6 (six) hours as needed (spasms). 08/22/12   Lanae Crumbly, PA-C  nitroGLYCERIN (NITROSTAT) 0.4 MG SL tablet Place 1 tablet (0.4 mg total) under the tongue every 5 (five) minutes x 3 doses as needed. 07/09/18   Lorretta Harp, MD  Nutritional Supplements (IMMUNOCAL PO) Take 1 packet by mouth  daily. Mix in juice and drink    [provider]  PRESCRIPTION MEDICATION Take 4 tablets by mouth See admin instructions. Take 4 tablets (antibiotic) by mouth one hour prior to dental appointment    [provider]    Family History History reviewed. No pertinent family history.  Social History Social History   Tobacco Use  . Smoking status: Former Smoker    Last attempt to quit: 09/10/1982    Years since quitting: 36.0  . Smokeless tobacco: Never Used  Substance Use Topics  . Alcohol use: Yes    Comment: occasional  . Drug use: No     Allergies   Tuberculin tests   Review of Systems Review of Systems  Gastrointestinal: Positive for abdominal pain.  All other systems reviewed and are negative.    Physical Exam Updated Vital Signs BP (!) 170/83 (BP Location: Right Arm)   Pulse 80   Temp 97.8 F (36.6 C) (Oral)   Resp 18   SpO2 94%   Physical Exam Vitals signs and nursing note reviewed.  Constitutional:      General: She is not in acute distress.    Appearance: She is well-developed. She is obese.  HENT:     Head: Atraumatic.  Eyes:     Conjunctiva/sclera: Conjunctivae normal.  Neck:     Musculoskeletal: Neck supple.  Cardiovascular:     Rate and Rhythm: Normal rate and regular rhythm.  Pulmonary:     Breath sounds: Normal breath sounds.  Abdominal:     General: Abdomen is flat.     Palpations: Abdomen is soft.     Tenderness: There is abdominal tenderness (Very mild generalized tenderness to the abdomen on exam without any focal point tenderness.  Negative Murphy sign, no pain at McBurney's point.).  Genitourinary:    Comments: Chaperone present during exam.  Normal external rectum without thrombosed hemorrhoid.  Normal rectal tone, no obvious bleeding noted.  Normal color stool on glove, no obvious mass. Musculoskeletal:     Comments: 5 out of 5 strength all 4 extremities with equal grip strength.  Skin:    Findings: No rash.    Neurological:     Mental Status: She is alert and oriented to person, place, and time.      ED Treatments / Results  Labs (all labs ordered are listed, but only abnormal results are displayed) Labs Reviewed  COMPREHENSIVE METABOLIC PANEL - Abnormal; Notable for the following components:      Result Value   BUN 25 (*)    Creatinine, Ser 1.08 (*)    GFR calc non Af Amer 50 (*)    GFR calc Af Amer 58 (*)    All other components within normal limits  CBC  POC OCCULT BLOOD, ED  I-STAT TROPONIN, ED    EKG None  Radiology No results found.  Procedures Procedures (including critical care time)  Medications Ordered in ED  Medications  sodium chloride 0.9 % bolus 1,000 mL (has no administration in time range)     Initial Impression / Assessment and Plan / ED Course  I have reviewed the triage vital signs and the nursing notes.  Pertinent labs & imaging results that were available during my care of the patient were reviewed by me and considered in my medical decision making (see chart for details).     BP (!) 153/91 (BP Location: Right Arm)   Pulse 70   Temp 97.7 F (36.5 C) (Oral)   Resp 16   SpO2 98%    Final Clinical Impressions(s) / ED Diagnoses   Final diagnoses:  Fatigue, unspecified type    ED Discharge Orders    None     10:46 PM Patient complaint of seeing dark stools since yesterday as well as vague abdominal discomfort.  She is currently on aspirin as well as Eliquis.  She does not have any focal neuro deficit on initial exam.  No active chest pain or shortness of breath.  Given her age and her vague abdominal discomfort, will obtain abdominal pelvis CT scan for further evaluation.  IV fluid given.  Patient is Jehovah's Witness and does not accept blood transfusion.  Fortunately her hemoglobin is 12.2, mild AKI with creatinine of 1.08.  IV fluid given.  11:14 PM Fecal occult blood test is negative.  Labs are reassuring.  No significant abdominal  discomfort on reexamination. Dr. Rogene Houston also evaluate pt.  Felt she is stable for discharge.  Pt will f/u with PCP.  Return precaution given.    Domenic Moras, PA-C 10/03/18 [id number]    Fredia Sorrow, MD 10/05/18 [phone]

## 2018-10-03 NOTE — ED Provider Notes (Signed)
Medical screening examination/treatment/procedure(s) were conducted as a shared visit with non-physician practitioner(s) and myself.  I personally evaluated the patient during the encounter.  None  Results for orders placed or performed during the hospital encounter of 10/03/18  Comprehensive metabolic panel  Result Value Ref Range   Sodium 141 135 - 145 mmol/L   Potassium 3.8 3.5 - 5.1 mmol/L   Chloride 111 98 - 111 mmol/L   CO2 22 22 - 32 mmol/L   Glucose, Bld 98 70 - 99 mg/dL   BUN 25 (H) 8 - 23 mg/dL   Creatinine, Ser 1.08 (H) 0.44 - 1.00 mg/dL   Calcium 10.0 8.9 - 10.3 mg/dL   Total Protein 7.0 6.5 - 8.1 g/dL   Albumin 3.8 3.5 - 5.0 g/dL   AST 15 15 - 41 U/L   ALT 12 0 - 44 U/L   Alkaline Phosphatase 71 38 - 126 U/L   Total Bilirubin 0.8 0.3 - 1.2 mg/dL   GFR calc non Af Amer 50 (L) >60 mL/min   GFR calc Af Amer 58 (L) >60 mL/min   Anion gap 8 5 - 15  CBC  Result Value Ref Range   WBC 8.9 4.0 - 10.5 K/uL   RBC 3.93 3.87 - 5.11 MIL/uL   Hemoglobin 12.2 12.0 - 15.0 g/dL   HCT 37.6 36.0 - 46.0 %   MCV 95.7 80.0 - 100.0 fL   MCH 31.0 26.0 - 34.0 pg   MCHC 32.4 30.0 - 36.0 g/dL   RDW 12.7 11.5 - 15.5 %   Platelets 222 150 - 400 K/uL   nRBC 0.0 0.0 - 0.2 %  POC occult blood, ED  Result Value Ref Range   Fecal Occult Bld NEGATIVE NEGATIVE    Patient seen by me along with physician assistant.  Patient presenting with a complaint of fatigue and having dark stools for the last 2 days.  Patient is on Eliquis for a blood clot in her leg.  She denies any nausea or vomiting denies any abdominal pain.  Denies any change in appetite.  Patient's hemoglobin here is fine at 12.2.  Hemoccult stool was negative for any blood.  Patient stable for discharge home and follow-up with her doctor for recheck of her stool for blood.  And also she will return for any recurrent or renewed bleeding.  Patient without any chest pain without any significant shortness of breath.  Abdominal abdomen on  exam is nontender.   Fredia Sorrow, MD 10/03/18 [phone]

## 2018-10-03 NOTE — Discharge Instructions (Addendum)
Please call and follow up with your doctor for further management of your condition.  Return if you have any concerns.

## 2018-10-04 NOTE — ED Notes (Signed)
E-sign not obtained d/t equipment malfunction.  D/c instructions, follow up and return precautions d/w pt and family member.  Pt verbalized understanding.

## 2018-10-06 ENCOUNTER — Telehealth: Payer: Self-pay | Admitting: Cardiovascular Disease

## 2018-10-06 DIAGNOSIS — E782 Mixed hyperlipidemia: Secondary | ICD-10-CM

## 2018-10-06 NOTE — Telephone Encounter (Signed)
Spoke with pt who was recently admitted to ED for c/o fatigue and dark stools. Pt states that she is concerned about continuing on her Eliquis because she feels it is responsible for her dark stools. Informed pt that per ED notes on 1/24, hemoccult stool sample was negative, but she should follow up with PCP for repeat of hemoccult stool per recommendation of ED provider. Pt agreeable with this. Pt states still having issues with dark stools, some fatigue, and still a little SOB and wants to know if she may hold Eliquis until her appt on 2/18 and take "adult aspirin" instead. Informed pt that earlier appt can be scheduled to be seen sooner. Pt will wait until after return call with recommendations to decide whether to schedule earlier appt. Informed pt that concerns would be forwarded to Dr. Gwenlyn Found and pharmD for any recommendations. Pt verbalized understanding

## 2018-10-06 NOTE — Telephone Encounter (Signed)
It is been 3 months, she can stop anticoagulation and recheck venous Doppler studies.

## 2018-10-06 NOTE — Telephone Encounter (Signed)
Per the guidelines patients with first unprovoked proximal DVT and high risk of bleeding should be anticoagulated for 3 months, but extended in those with low/moderate risk of GI bleed.    She appears to have an unprovoked DVT, but other than a low hemoglobin/hematocrit, I don't see any indicators of GI bleed risk.  Will need to defer to Dr. Gwenlyn Found as to whether patient can stop Eliquis.  DVT was Sept 27

## 2018-10-06 NOTE — Telephone Encounter (Signed)
Patient called stating she was in the ER on Friday.  She knows she has an appt on 2/18, but she feels she needs to talk to Dr Kennon Holter nurse before then.

## 2018-10-07 NOTE — Telephone Encounter (Signed)
Spoke with pt, she was given the okay to stop eliquis. She had dopplers ion 09-30-18 will make sure dr berry is aware and the patient also ask if she needed to take an aspirin. Will forward to dr berry to review and advise.

## 2018-10-07 NOTE — Telephone Encounter (Signed)
Venous Dopplers performed 09/30/2018 revealed chronic left below the knee popliteal vein thrombosis.  The venous Dopplers have improved since her initial onset.  Okay to stop Eliquis and follow clinically.  If she has recurrent DVT or extension of the previous DVT she will need to be on lifelong oral anticoagulation.

## 2018-10-08 NOTE — Telephone Encounter (Signed)
Follow up   Patient would  Like a return call per the previous messages.

## 2018-10-09 NOTE — Telephone Encounter (Signed)
New message  Patient calling back about the issue states she hasn't been called yet.  Would like to know if she should continue taking aspirin.

## 2018-10-09 NOTE — Telephone Encounter (Signed)
Spoke with pt, aware per dr berry she can stop the eliquis and she does not need an aspirin.

## 2018-10-28 ENCOUNTER — Encounter: Payer: Self-pay | Admitting: Cardiovascular Disease

## 2018-10-28 ENCOUNTER — Ambulatory Visit: Payer: Medicare Other | Admitting: Cardiovascular Disease

## 2018-10-28 VITALS — BP 145/85 | HR 83 | Ht 66.5 in | Wt 225.6 lb

## 2018-10-28 DIAGNOSIS — I214 Non-ST elevation (NSTEMI) myocardial infarction: Secondary | ICD-10-CM | POA: Diagnosis not present

## 2018-10-28 DIAGNOSIS — E782 Mixed hyperlipidemia: Secondary | ICD-10-CM | POA: Diagnosis not present

## 2018-10-28 DIAGNOSIS — R030 Elevated blood-pressure reading, without diagnosis of hypertension: Secondary | ICD-10-CM

## 2018-10-28 NOTE — Assessment & Plan Note (Signed)
History of pulmonary embolism and left lower extremity DVT by duplex ultrasound.  She was on Eliquis oral anticoagulation which was recently discontinued after GI bleed.  Her venous Dopplers performed 09/30/2018 showed marked improvement in her left lower extremity DVT now with chronic below the knee popliteal vein occlusion.

## 2018-10-28 NOTE — Progress Notes (Signed)
10/28/2018 ZYNIA WOJTOWICZ   [birth year]  [id number]  Primary Physician Harlan Stains, MD Primary Cardiologist: Lorretta Harp MD Lupe Carney, [place]  HPI:  Karen Owens is a 78 y.o.  moderately overweight divorced African-American female mother of 6 children, grandmother of 61 grandchildren who is being seen for post hospital follow-up.  Her primary care provider is Dr. Harlan Stains.  She is a retired Theatre manager.  I last saw her in the office 06/20/2018.   Her cardiovascular risk factors are only of remote tobacco abuse and mild untreated hyperlipidemia.Marland Kitchen  She was admitted with chest pain from 9/27 through 10/1.  Troponin rose to approximately 1.  Her echo did show anteroapical wall motion abnormality.  I performed radial diagnostic catheter revealed essentially normal coronary arteries.  Subsequent d-dimer was positive at around 9 with a VQ scan was intermediate probability and subsequent venous Dopplers that showed acute left lower extremity DVT for which she was placed on Eliquis oral anticoagulation.    Since I saw her 4 months ago she was seen in the emergency room for melena.  Hemoglobin was mildly reduced.  Her Eliquis was discontinued.  Since that time she is felt clinically improved.  She denies chest pain or shortness of breath.   Current Meds  Medication Sig  . APPLE CIDER VINEGAR PO Take 250 mg by mouth daily.  Marland Kitchen BEE POLLEN PO Take 2 capsules by mouth 2 (two) times daily with breakfast and lunch.  . cycloSPORINE (RESTASIS MULTIDOSE) 0.05 % ophthalmic emulsion Place 1 drop into both eyes 2 (two) times daily as needed (dry eyes).  Marland Kitchen HYDROcodone-acetaminophen (NORCO) 10-325 MG per tablet Take 1-2 tablets by mouth every 4 (four) hours as needed for pain (breakthrough pain).  Marland Kitchen ibuprofen (ADVIL,MOTRIN) 800 MG tablet Take 800 mg by mouth daily as needed (inflammation/pain).   . methocarbamol (ROBAXIN) 500 MG tablet Take 1 tablet (500 mg total) by mouth every 6 (six) hours as  needed (spasms).  . nitroGLYCERIN (NITROSTAT) 0.4 MG SL tablet Place 1 tablet (0.4 mg total) under the tongue every 5 (five) minutes x 3 doses as needed.  . Nutritional Supplements (IMMUNOCAL PO) Take 1 packet by mouth daily. Mix in juice and drink  . PRESCRIPTION MEDICATION Take 4 tablets by mouth See admin instructions. Take 4 tablets (antibiotic) by mouth one hour prior to dental appointment     Allergies  Allergen Reactions  . Tuberculin Tests Hives    Social History   Socioeconomic History  . Marital status: Divorced    Spouse name: Not on file  . Number of children: Not on file  . Years of education: Not on file  . Highest education level: Not on file  Occupational History  . Not on file  Social Needs  . Financial resource strain: Not on file  . Food insecurity:    Worry: Not on file    Inability: Not on file  . Transportation needs:    Medical: Not on file    Non-medical: Not on file  Tobacco Use  . Smoking status: Former Smoker    Last attempt to quit: 09/10/1982    Years since quitting: 36.1  . Smokeless tobacco: Never Used  Substance and Sexual Activity  . Alcohol use: Yes    Comment: occasional  . Drug use: No  . Sexual activity: Never    Birth control/protection: Post-menopausal  Lifestyle  . Physical activity:    Days per week: Not on file  Minutes per session: Not on file  . Stress: Not on file  Relationships  . Social connections:    Talks on phone: Not on file    Gets together: Not on file    Attends religious service: Not on file    Active member of club or organization: Not on file    Attends meetings of clubs or organizations: Not on file    Relationship status: Not on file  . Intimate partner violence:    Fear of current or ex partner: Not on file    Emotionally abused: Not on file    Physically abused: Not on file    Forced sexual activity: Not on file  Other Topics Concern  . Not on file  Social History Narrative  . Not on file      Review of Systems: General: negative for chills, fever, night sweats or weight changes.  Cardiovascular: negative for chest pain, dyspnea on exertion, edema, orthopnea, palpitations, paroxysmal nocturnal dyspnea or shortness of breath Dermatological: negative for rash Respiratory: negative for cough or wheezing Urologic: negative for hematuria Abdominal: negative for nausea, vomiting, diarrhea, bright red blood per rectum, melena, or hematemesis Neurologic: negative for visual changes, syncope, or dizziness All other systems reviewed and are otherwise negative except as noted above.    Blood pressure (!) 145/85, pulse 83, height 5' 6.5" (1.689 m), weight 225 lb 9.6 oz (102.3 kg).  General appearance: alert and no distress Neck: no adenopathy, no carotid bruit, no JVD, supple, symmetrical, trachea midline and thyroid not enlarged, symmetric, no tenderness/mass/nodules Lungs: clear to auscultation bilaterally Heart: regular rate and rhythm, S1, S2 normal, no murmur, click, rub or gallop Extremities: extremities normal, atraumatic, no cyanosis or edema Pulses: 2+ and symmetric Skin: Skin color, texture, turgor normal. No rashes or lesions Neurologic: Alert and oriented X 3, normal strength and tone. Normal symmetric reflexes. Normal coordination and gait  EKG not performed today  ASSESSMENT AND PLAN:   ELEVATED BLOOD PRESSURE WITHOUT DIAGNOSIS OF HYPERTENSION History of essential hypertension her blood pressure measured today at 145/85.  She is not on antihypertensive medications.  NSTEMI (non-ST elevated myocardial infarction) ([place]) History of non-STEMI in the past with diagnostic cath performed 06/09/2018 that showed essentially normal coronary arteries.  Pulmonary embolism (H[facility]) History of pulmonary embolism and left lower extremity DVT by duplex ultrasound.  She was on Eliquis oral anticoagulation which was recently discontinued after GI bleed.  Her venous Dopplers performed  09/30/2018 showed marked improvement in her left lower extremity DVT now with chronic below the knee popliteal vein occlusion.      Lorretta Harp MD FACP,FACC,FAHA, [facility] 10/28/2018 10:16 AM

## 2018-10-28 NOTE — Patient Instructions (Signed)
Medication Instructions:  Your physician recommends that you continue on your current medications as directed. Please refer to the Current Medication list given to you today.  If you need a refill on your cardiac medications before your next appointment, please call your pharmacy.   Lab work: Your physician recommends that you return for lab work in 2 weeks: lipid and liver panels  If you have labs (blood work) drawn today and your tests are completely normal, you will receive your results only by: Marland Kitchen MyChart Message (if you have MyChart) OR . A paper copy in the mail If you have any lab test that is abnormal or we need to change your treatment, we will call you to review the results.  Testing/Procedures: NONE  Follow-Up: At [facility], you and your health needs are our priority.  As part of our continuing mission to provide you with exceptional heart care, we have created designated Provider Care Teams.  These Care Teams include your primary Cardiologist (physician) and Advanced Practice Providers (APPs -  Physician Assistants and Nurse Practitioners) who all work together to provide you with the care you need, when you need it. . You will need a follow up appointment in 12 months.  Please call our office 2 months in advance to schedule this appointment.  You may see Dr. Gwenlyn Found or one of the following Advanced Practice Providers on your designated Care Team:   . [name], [place] . Almyra Deforest, PA-C . Fabian Sharp, PA-C . Jory Sims, DNP . Rosaria Ferries, PA-C . Roby Lofts, PA-C . Sande Rives, PA-C

## 2018-10-28 NOTE — Assessment & Plan Note (Signed)
History of non-STEMI in the past with diagnostic cath performed 06/09/2018 that showed essentially normal coronary arteries.

## 2018-10-28 NOTE — Assessment & Plan Note (Signed)
History of essential hypertension her blood pressure measured today at 145/85.  She is not on antihypertensive medications.

## 2018-11-17 ENCOUNTER — Other Ambulatory Visit: Payer: Self-pay | Admitting: Family Medicine

## 2018-11-17 DIAGNOSIS — E042 Nontoxic multinodular goiter: Secondary | ICD-10-CM

## 2019-02-05 ENCOUNTER — Telehealth: Payer: Self-pay | Admitting: Cardiovascular Disease

## 2019-02-05 NOTE — Telephone Encounter (Signed)
smartphone/ my chart declined/ consent/ pre reg completed °

## 2019-02-06 ENCOUNTER — Encounter: Payer: Self-pay | Admitting: Cardiovascular Disease

## 2019-02-06 ENCOUNTER — Telehealth: Payer: Self-pay

## 2019-02-06 ENCOUNTER — Telehealth (INDEPENDENT_AMBULATORY_CARE_PROVIDER_SITE_OTHER): Payer: Medicare HMO | Admitting: Cardiovascular Disease

## 2019-02-06 DIAGNOSIS — I82402 Acute embolism and thrombosis of unspecified deep veins of left lower extremity: Secondary | ICD-10-CM | POA: Diagnosis not present

## 2019-02-06 DIAGNOSIS — E782 Mixed hyperlipidemia: Secondary | ICD-10-CM | POA: Diagnosis not present

## 2019-02-06 DIAGNOSIS — I2693 Single subsegmental pulmonary embolism without acute cor pulmonale: Secondary | ICD-10-CM

## 2019-02-06 NOTE — Progress Notes (Signed)
Virtual Visit via Telephone Note   This visit type was conducted due to national recommendations for restrictions regarding the COVID-19 Pandemic (e.g. social distancing) in an effort to limit this patient's exposure and mitigate transmission in our community.  Due to her co-morbid illnesses, this patient is at least at moderate risk for complications without adequate follow up.  This format is felt to be most appropriate for this patient at this time.  The patient did not have access to video technology/had technical difficulties with video requiring transitioning to audio format only (telephone).  All issues noted in this document were discussed and addressed.  No physical exam could be performed with this format.  Please refer to the patient's chart for her  consent to telehealth for [facility].   Date:  02/06/2019   ID:  Karen Owens, DOB 04-19-1941, MRN [id number]  Patient Location: Home Provider Location: Home  PCP:  Harlan Stains, MD  Cardiologist: Dr. Quay Burow Electrophysiologist:  None   Evaluation Performed:  Follow-Up Visit  Chief Complaint: Fatigue  History of Present Illness:    Karen Owens is a 78 y.o.  moderately overweight divorced African-American female mother of 6 children, grandmother of 57 grandchildren who is being seen for post hospital follow-up. Her primary care provider is Dr. Harlan Stains. She is a retired Theatre manager.  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  Her cardiovascular risk factors are only of remote tobacco abuse and mild untreated hyperlipidemia.Marland Kitchen She was admitted with chest pain from 9/27 through 10/1. Troponin rose to approximately 1. Her echo did show anteroapical wall motion abnormality. I performed radial diagnostic catheter revealed essentially normal coronary arteries. Subsequent d-dimer was positive at around 9 with a VQ scan was intermediate probability and subsequent venous Dopplers that showed acute left lower extremity  DVT for which she was placed on Eliquis oral anticoagulation.    She was seen in the emergency room for melena.  Hemoglobin was mildly reduced.  Her Eliquis was discontinued.    Since I saw her 3 months ago she does complain of some increased fatigue and some mild dyspnea but no other symptoms of COVID-19.  She denies chest pain.  She is no longer on Eliquis.  I have suggested that she go back to see her PCP for evaluation of this including some routine blood work.   The patient does not have symptoms concerning for COVID-19 infection (fever, chills, cough, or new shortness of breath).    Past Medical History:  Diagnosis Date  . Arthritis   . Asthma   . Complication of anesthesia    difficulty waking up  . Refusal of blood transfusions as patient is Jehovah's Witness   . Thyroid nodule    Past Surgical History:  Procedure Laterality Date  . DILATION AND CURETTAGE OF UTERUS    . LEFT HEART CATH AND CORONARY ANGIOGRAPHY N/A 06/09/2018   Procedure: LEFT HEART CATH AND CORONARY ANGIOGRAPHY;  Surgeon: Lorretta Harp, MD;  Location: [facility];  Service: Cardiovascular;  Laterality: N/A;  . LIPOMA EXCISION     right arm  . TOTAL KNEE ARTHROPLASTY     left  . TOTAL KNEE ARTHROPLASTY  08/20/2012   right knee  . TOTAL KNEE ARTHROPLASTY  08/20/2012   Procedure: TOTAL KNEE ARTHROPLASTY;  Surgeon: Ninetta Lights, MD;  Location: [place];  Service: Orthopedics;  Laterality: Right;     No outpatient medications have been marked as taking for the 02/06/19  encounter (Appointment) with Lorretta Harp, MD.     Allergies:   Tuberculin tests   Social History   Tobacco Use  . Smoking status: Former Smoker    Last attempt to quit: 09/10/1982    Years since quitting: 36.4  . Smokeless tobacco: Never Used  Substance Use Topics  . Alcohol use: Yes    Comment: occasional  . Drug use: No     Family Hx: The patient's family history is not on file.  ROS:   Please see the history  of present illness.     All other systems reviewed and are negative.   Prior CV studies:   The following studies were reviewed today:  None  Labs/Other Tests and Data Reviewed:    EKG:  No ECG reviewed.  Recent Labs: 10/03/2018: ALT 12; BUN 25; Creatinine, Ser 1.08; Hemoglobin 12.2; Platelets 222; Potassium 3.8; Sodium 141   Recent Lipid Panel Lab Results  Component Value Date/Time   CHOL 199 06/07/2018 04:24 AM   TRIG 26 06/07/2018 04:24 AM   HDL 65 06/07/2018 04:24 AM   CHOLHDL 3.1 06/07/2018 04:24 AM   LDLCALC 129 (H) 06/07/2018 04:24 AM    Wt Readings from Last 3 Encounters:  10/28/18 225 lb 9.6 oz (102.3 kg)  06/20/18 226 lb 6.4 oz (102.7 kg)  06/08/18 230 lb 13.2 oz (104.7 kg)     Objective:    Vital Signs:  There were no vitals taken for this visit.   VITAL SIGNS:  reviewed a complete physical exam was not performed today since this was a virtual telemedicine phone visit  ASSESSMENT & PLAN:    1. Hyperlipidemia- history of mild hyperlipidemia not on statin therapy with fasting lipid profile performed 05/30/2018 revealing a total cholesterol 199, and LDL of 129, HDL 65. 2. Normal coronary arteries- history of cardiac catheterization performed by myself 9/19 revealing normal coronary arteries and normal LV function. 3. DVT- history of DVT on Eliquis in the past which was discontinued because of melena   COVID-19 Education: The signs and symptoms of COVID-19 were discussed with the patient and how to seek care for testing (follow up with PCP or arrange E-visit).  The importance of social distancing was discussed today.  Time:   Today, I have spent 5 minutes with the patient with telehealth technology discussing the above problems.     Medication Adjustments/Labs and Tests Ordered: Current medicines are reviewed at length with the patient today.  Concerns regarding medicines are outlined above.   Tests Ordered: No orders of the defined types were placed in  this encounter.   Medication Changes: No orders of the defined types were placed in this encounter.   Disposition:  Follow up in 1 year(s)  Signed, Quay Burow, MD  02/06/2019 7:17 AM    [place]

## 2019-02-06 NOTE — Patient Instructions (Signed)

## 2019-02-06 NOTE — Telephone Encounter (Signed)
Contacted pt to review AVS from 5/29 appt. LMTCB and stated AVS to be sent via mail. Letter including After Visit Summary and any other necessary documents to be mailed to the patient's address on file.

## 2019-02-10 ENCOUNTER — Telehealth: Payer: Self-pay | Admitting: Cardiovascular Disease

## 2019-03-23 DIAGNOSIS — M19012 Primary osteoarthritis, left shoulder: Secondary | ICD-10-CM | POA: Diagnosis not present

## 2019-03-23 DIAGNOSIS — M19011 Primary osteoarthritis, right shoulder: Secondary | ICD-10-CM | POA: Diagnosis not present

## 2019-03-23 DIAGNOSIS — Z6837 Body mass index (BMI) 37.0-37.9, adult: Secondary | ICD-10-CM | POA: Diagnosis not present

## 2019-04-26 ENCOUNTER — Observation Stay (HOSPITAL_COMMUNITY)
Admission: EM | Admit: 2019-04-26 | Discharge: 2019-04-27 | Disposition: A | Discharge status: Home / Self Care | Payer: Medicare HMO | Attending: Internal Medicine | Admitting: Internal Medicine

## 2019-04-26 ENCOUNTER — Encounter (HOSPITAL_COMMUNITY): Payer: Self-pay | Admitting: *Deleted

## 2019-04-26 ENCOUNTER — Emergency Department (HOSPITAL_COMMUNITY): Payer: Medicare HMO

## 2019-04-26 ENCOUNTER — Other Ambulatory Visit: Payer: Self-pay

## 2019-04-26 DIAGNOSIS — Z87891 Personal history of nicotine dependence: Secondary | ICD-10-CM | POA: Diagnosis not present

## 2019-04-26 DIAGNOSIS — I82402 Acute embolism and thrombosis of unspecified deep veins of left lower extremity: Secondary | ICD-10-CM | POA: Diagnosis not present

## 2019-04-26 DIAGNOSIS — Z86718 Personal history of other venous thrombosis and embolism: Secondary | ICD-10-CM | POA: Insufficient documentation

## 2019-04-26 DIAGNOSIS — Z20828 Contact with and (suspected) exposure to other viral communicable diseases: Secondary | ICD-10-CM | POA: Diagnosis not present

## 2019-04-26 DIAGNOSIS — R079 Chest pain, unspecified: Secondary | ICD-10-CM | POA: Diagnosis present

## 2019-04-26 DIAGNOSIS — I201 Angina pectoris with documented spasm: Secondary | ICD-10-CM | POA: Diagnosis not present

## 2019-04-26 DIAGNOSIS — R0789 Other chest pain: Secondary | ICD-10-CM | POA: Diagnosis not present

## 2019-04-26 DIAGNOSIS — I82409 Acute embolism and thrombosis of unspecified deep veins of unspecified lower extremity: Secondary | ICD-10-CM | POA: Diagnosis present

## 2019-04-26 LAB — CBC
HCT: 38 % (ref 36.0–46.0)
Hemoglobin: 12.4 g/dL (ref 12.0–15.0)
MCH: 32.1 pg (ref 26.0–34.0)
MCHC: 32.6 g/dL (ref 30.0–36.0)
MCV: 98.4 fL (ref 80.0–100.0)
Platelets: 223 10*3/uL (ref 150–400)
RBC: 3.86 MIL/uL — ABNORMAL LOW (ref 3.87–5.11)
RDW: 12.9 % (ref 11.5–15.5)
WBC: 8.5 10*3/uL (ref 4.0–10.5)
nRBC: 0 % (ref 0.0–0.2)

## 2019-04-26 LAB — BASIC METABOLIC PANEL
Anion gap: 10 (ref 5–15)
BUN: 18 mg/dL (ref 8–23)
CO2: 21 mmol/L — ABNORMAL LOW (ref 22–32)
Calcium: 10.2 mg/dL (ref 8.9–10.3)
Chloride: 109 mmol/L (ref 98–111)
Creatinine, Ser: 0.86 mg/dL (ref 0.44–1.00)
GFR calc Af Amer: >60 mL/min (ref >60)
GFR calc non Af Amer: >60 mL/min (ref >60)
Glucose, Bld: 106 mg/dL — ABNORMAL HIGH (ref 70–99)
Potassium: 3.7 mmol/L (ref 3.5–5.1)
Sodium: 140 mmol/L (ref 135–145)

## 2019-04-26 LAB — D-DIMER, QUANTITATIVE: D-Dimer, Quant: 1.33 ug/mL-FEU — ABNORMAL HIGH (ref 0.00–0.50)

## 2019-04-26 LAB — TROPONIN I (HIGH SENSITIVITY)
Troponin I (High Sensitivity): 21 ng/L — ABNORMAL HIGH (ref <18)
Troponin I (High Sensitivity): 18 ng/L — ABNORMAL HIGH (ref <18)

## 2019-04-26 MED ORDER — LORAZEPAM 2 MG/ML IJ SOLN
1.0000 mg | Freq: Once | INTRAMUSCULAR | Status: AC | PRN
  Administered 2019-04-26: 1 mg via INTRAVENOUS
  Filled 2019-04-26: qty 1

## 2019-04-26 MED ORDER — SODIUM CHLORIDE 0.9% FLUSH: 3.0000 mL | Freq: Once | INTRAVENOUS | Status: DC

## 2019-04-26 NOTE — ED Notes (Signed)
Had unit secretary call IV team to expedite IV team for the stat CTA ordered after the fact of original placement.

## 2019-04-26 NOTE — ED Triage Notes (Signed)
The pt is c/o chest pain since Friday intermittently worse today  She had nitro x 3 and the pain went away  Ems came out and told her to come to the ed.  No pain at present  She had 4 baby aspirin before she came in today

## 2019-04-26 NOTE — ED Notes (Signed)
ED TO INPATIENT HANDOFF REPORT  ED Nurse Name and Phone #: Benjamine Mola [phone]  S Name/Age/Gender Karen Owens 78 y.o. female Room/Bed: 025C/025C  Code Status   Code Status: Prior  Home/SNF/Other Home Patient oriented to: self, place, time and situation Is this baseline? Yes   Triage Complete: Triage complete  Chief Complaint chest pain  Triage Note The pt is c/o chest pain since Friday intermittently worse today  She had nitro x 3 and the pain went away  Ems came out and told her to come to the ed.  No pain at present  She had 4 baby aspirin before she came in today   Allergies Allergies  Allergen Reactions  . Tuberculin Tests Hives  . Other Other (See Comments)    Jehovah's Witness- NO BLOOD PRODUCTS!!    Level of Care/Admitting Diagnosis ED Disposition    ED Disposition Condition [facility] Area: [place] [[id number]]  Level of Care: Telemetry Cardiac [103]  I expect the patient will be discharged within 24 hours: Yes  LOW acuity---Tx typically complete <24 hrs---ACUTE conditions typically can be evaluated <24 hours---LABS likely to return to acceptable levels <24 hours---IS near functional baseline---EXPECTED to return to current living arrangement---NOT newly hypoxic: Meets criteria for 5C-Observation unit  Covid Evaluation: Asymptomatic Screening Protocol (No Symptoms)  Diagnosis: Chest pain [196222]  Admitting Physician: Vianne Bulls [[id number]]  Attending Physician: Vianne Bulls [[id number]]  PT Class (Do Not Modify): Observation [104]  PT Acc Code (Do Not Modify): Observation [10022]       B Medical/Surgery History Past Medical History:  Diagnosis Date  . Arthritis   . Asthma   . Complication of anesthesia    difficulty waking up  . Coronary artery disease   . Refusal of blood transfusions as patient is Jehovah's Witness   . Thyroid nodule    Past Surgical History:  Procedure Laterality Date  . DILATION AND  CURETTAGE OF UTERUS    . LEFT HEART CATH AND CORONARY ANGIOGRAPHY N/A 06/09/2018   Procedure: LEFT HEART CATH AND CORONARY ANGIOGRAPHY;  Surgeon: Lorretta Harp, MD;  Location: [facility];  Service: Cardiovascular;  Laterality: N/A;  . LIPOMA EXCISION     right arm  . TOTAL KNEE ARTHROPLASTY     left  . TOTAL KNEE ARTHROPLASTY  08/20/2012   right knee  . TOTAL KNEE ARTHROPLASTY  08/20/2012   Procedure: TOTAL KNEE ARTHROPLASTY;  Surgeon: Ninetta Lights, MD;  Location: [facility];  Service: Orthopedics;  Laterality: Right;     A IV Location/Drains/Wounds Patient Lines/Drains/Airways Status   Active Line/Drains/Airways    Name:   Placement date:   Placement time:   Site:   Days:   Peripheral IV 04/26/19 Left Hand   04/26/19    2141    Hand   less than 1   Incision 08/20/12 Leg Right   08/20/12    1153     2440          Intake/Output Last 24 hours No intake or output data in the 24 hours ending 04/26/19 2246  Labs/Imaging Results for orders placed or performed during the hospital encounter of 04/26/19 (from the past 48 hour(s))  Basic metabolic panel     Status: Abnormal   Collection Time: 04/26/19  5:10 PM  Result Value Ref Range   Sodium 140 135 - 145 mmol/L   Potassium 3.7 3.5 - 5.1 mmol/L   Chloride 109 98 -  111 mmol/L   CO2 21 (L) 22 - 32 mmol/L   Glucose, Bld 106 (H) 70 - 99 mg/dL   BUN 18 8 - 23 mg/dL   Creatinine, Ser 0.86 0.44 - 1.00 mg/dL   Calcium 10.2 8.9 - 10.3 mg/dL   GFR calc non Af Amer >60 >60 mL/min   GFR calc Af Amer >60 >60 mL/min   Anion gap 10 5 - 15    Comment: Performed at [facility] [address] [place], [place] [address]  CBC     Status: Abnormal   Collection Time: 04/26/19  5:10 PM  Result Value Ref Range   WBC 8.5 4.0 - 10.5 K/uL   RBC 3.86 (L) 3.87 - 5.11 MIL/uL   Hemoglobin 12.4 12.0 - 15.0 g/dL   HCT 38.0 36.0 - 46.0 %   MCV 98.4 80.0 - 100.0 fL   MCH 32.1 26.0 - 34.0 pg   MCHC 32.6 30.0 - 36.0 g/dL   RDW 12.9 11.5 -  15.5 %   Platelets 223 150 - 400 K/uL   nRBC 0.0 0.0 - 0.2 %    Comment: Performed at [facility], [place] [address], [place], [place] [address]  Troponin I (High Sensitivity)     Status: Abnormal   Collection Time: 04/26/19  5:10 PM  Result Value Ref Range   Troponin I (High Sensitivity) 21 (H) <18 ng/L    Comment: (NOTE) Elevated high sensitivity troponin I (hsTnI) values and significant  changes across serial measurements may suggest ACS but many other  chronic and acute conditions are known to elevate hsTnI results.  Refer to the "Links" section for chest pain algorithms and additional  guidance. Performed at [facility], [place] [address], [place], [place] [address]   D-dimer, quantitative (not at [facility])     Status: Abnormal   Collection Time: 04/26/19  5:10 PM  Result Value Ref Range   D-Dimer, Quant 1.33 (H) 0.00 - 0.50 ug/mL-FEU    Comment: (NOTE) At the manufacturer cut-off of 0.50 ug/mL FEU, this assay has been documented to exclude PE with a sensitivity and negative predictive value of 97 to 99%.  At this time, this assay has not been approved by the FDA to exclude DVT/VTE. Results should be correlated with clinical presentation. Performed at [facility], [place] [address] [place], [place] [address]   Troponin I (High Sensitivity)     Status: Abnormal   Collection Time: 04/26/19  9:22 PM  Result Value Ref Range   Troponin I (High Sensitivity) 18 (H) <18 ng/L    Comment: (NOTE) Elevated high sensitivity troponin I (hsTnI) values and significant  changes across serial measurements may suggest ACS but many other  chronic and acute conditions are known to elevate hsTnI results.  Refer to the "Links" section for chest pain algorithms and additional  guidance. Performed at [facility], [place] [address] [place], [place] [address]    Dg Chest 2 View  Result Date: 04/26/2019 CLINICAL DATA:  Chest pain since Friday, worse today. EXAM: CHEST - 2 VIEW  COMPARISON:  Chest x-ray dated 06/09/2017. FINDINGS: Borderline cardiomegaly, stable. Overall cardiomediastinal silhouette appears stable in size and configuration. Lungs are clear. No pleural effusion or pneumothorax seen. Osseous structures about the chest are unremarkable. IMPRESSION: No active cardiopulmonary disease. No evidence of pneumonia or pulmonary edema. Electronically Signed   By: Franki Cabot M.D.   On: 04/26/2019 17:50    Pending  Labs [employer] (From admission, onward)    Start     Ordered   04/26/19 2119  SARS CORONAVIRUS 2 Nasal Swab Aptima Multi Swab  (Asymptomatic/Tier 2 Patients Labs)  Once,   STAT    Question Answer Comment  Is this test for diagnosis or screening Screening   Symptomatic for COVID-19 as defined by CDC No   Hospitalized for COVID-19 No   Admitted to ICU for COVID-19 No   Previously tested for COVID-19 No   Resident in a congregate (group) care setting No   Employed in healthcare setting No   Pregnant No      04/26/19 2118   Signed and Held  Creatinine, serum  (enoxaparin (LOVENOX)    CrCl >/= 30 ml/min)  Weekly,   R    Comments: while on enoxaparin therapy    Signed and Held   Signed and Held  Basic metabolic panel  Tomorrow morning,   R     Signed and Held   Signed and Held  CBC  Tomorrow morning,   R     Signed and Held          Vitals/Pain Today's Vitals   04/26/19 2054 04/26/19 2055 04/26/19 2211 04/26/19 2212  BP: (!) 172/85  (!) 174/89   Pulse:  69 64   Resp:  14 18   Temp:      TempSrc:      SpO2:  100% 100%   Weight:      Height:      PainSc:    0-No pain    Isolation Precautions No active isolations  Medications Medications  sodium chloride flush (NS) 0.9 % injection 3 mL (has no administration in time range)    Mobility walks with person assist Low fall risk   Focused Assessments Cardiac Assessment Handoff:  Cardiac Rhythm: Normal sinus rhythm Lab Results  Component Value Date   TROPONINI 1.03 (HH)  06/07/2018   Lab Results  Component Value Date   DDIMER 1.33 (H) 04/26/2019   Does the Patient currently have chest pain? No     R Recommendations: See Admitting Provider Note  Report given to:   Additional Notes:

## 2019-04-26 NOTE — ED Provider Notes (Signed)
[facility] EMERGENCY DEPARTMENT Provider Note   CSN: [id number] Arrival date & time: 04/26/19  1656     History   Chief Complaint Chief Complaint  Patient presents with  . Chest Pain    HPI Karen Owens is a 78 y.o. female.     HPI  78 year old female presents with chest pain. Started around 3 PM.  She took 3 nitroglycerin which eventually relieved the pain.  No shortness of breath, vomiting, nausea, or abdominal pain.  She did have some back pain.  Feels fine now.  Was given aspirin by EMS.  No leg pain or swelling.  She did have a DVT a year or so ago but has been taken off Eliquis after some melena.  Past Medical History:  Diagnosis Date  . Arthritis   . Asthma   . Complication of anesthesia    difficulty waking up  . Coronary artery disease   . Refusal of blood transfusions as patient is Jehovah's Witness   . Thyroid nodule     Patient Active Problem List   Diagnosis Date Noted  . DVT (deep venous thrombosis) ([facility]) 06/10/2018  . Pulmonary embolism ([place]) 06/10/2018  . HLD (hyperlipidemia) 06/10/2018  . NSTEMI (non-ST elevated myocardial infarction) ([place]) 06/06/2018  . FIBROIDS, UTERUS 12/11/2007  . ELEVATED BLOOD PRESSURE WITHOUT DIAGNOSIS OF HYPERTENSION 12/11/2007  . DENTAL CARIES 10/28/2007  . CALCANEAL SPUR, RIGHT 10/28/2007  . ASTHMA 10/13/2007  . GASTROENTERITIS, ACUTE 08/11/2007  . ANXIETY 07/02/2007  . ARTHRITIS, RIGHT SHOULDER 07/02/2007  . PAP SMEAR, LGSIL, ABNORMAL 05/27/2007  . COLPOSCOPY WITH BIOPSY, HX OF 05/27/2007    Past Surgical History:  Procedure Laterality Date  . DILATION AND CURETTAGE OF UTERUS    . LEFT HEART CATH AND CORONARY ANGIOGRAPHY N/A 06/09/2018   Procedure: LEFT HEART CATH AND CORONARY ANGIOGRAPHY;  Surgeon: Lorretta Harp, MD;  Location: [facility];  Service: Cardiovascular;  Laterality: N/A;  . LIPOMA EXCISION     right arm  . TOTAL KNEE ARTHROPLASTY     left  . TOTAL KNEE ARTHROPLASTY   08/20/2012   right knee  . TOTAL KNEE ARTHROPLASTY  08/20/2012   Procedure: TOTAL KNEE ARTHROPLASTY;  Surgeon: Ninetta Lights, MD;  Location: [facility];  Service: Orthopedics;  Laterality: Right;     OB History   No obstetric history on file.      Home Medications    Prior to Admission medications   Medication Sig Start Date End Date Taking? Authorizing Provider  APPLE CIDER VINEGAR PO Take 250 mg by mouth daily.    [provider]  cycloSPORINE (RESTASIS MULTIDOSE) 0.05 % ophthalmic emulsion Place 1 drop into both eyes 2 (two) times daily as needed (dry eyes).    [provider]  ibuprofen (ADVIL,MOTRIN) 800 MG tablet Take 800 mg by mouth daily as needed (inflammation/pain).  05/05/18   [provider]  nitroGLYCERIN (NITROSTAT) 0.4 MG SL tablet Place 1 tablet (0.4 mg total) under the tongue every 5 (five) minutes x 3 doses as needed. 07/09/18   Lorretta Harp, MD  Nutritional Supplements (IMMUNOCAL PO) Take 1 packet by mouth daily. Mix in juice and drink    [provider]  Omega 3-6-9 Fatty Acids (OMEGA-3-6-9 PO) Take 480 mg by mouth daily.    [provider]    Family History No family history on file.  Social History Social History   Tobacco Use  . Smoking status: Former Smoker  Quit date: 09/10/1982    Years since quitting: 36.6  . Smokeless tobacco: Never Used  Substance Use Topics  . Alcohol use: Yes    Comment: occasional  . Drug use: No     Allergies   Tuberculin tests   Review of Systems Review of Systems  Respiratory: Negative for shortness of breath.   Cardiovascular: Positive for chest pain. Negative for leg swelling.  Gastrointestinal: Negative for abdominal pain and vomiting.  Musculoskeletal: Positive for back pain.  All other systems reviewed and are negative.    Physical Exam Updated Vital Signs BP (!) 172/85   Pulse 69   Temp 98.3 F (36.8 C) (Oral)   Resp 14   Ht 5\' 6"  (1.676 m)   Wt 104.3  kg   SpO2 100%   BMI 37.11 kg/m   Physical Exam Vitals signs and nursing note reviewed.  Constitutional:      Appearance: She is well-developed. She is obese.  HENT:     Head: Normocephalic and atraumatic.     Right Ear: External ear normal.     Left Ear: External ear normal.     Nose: Nose normal.  Eyes:     General:        Right eye: No discharge.        Left eye: No discharge.  Cardiovascular:     Rate and Rhythm: Normal rate and regular rhythm.     Heart sounds: Normal heart sounds.  Pulmonary:     Effort: Pulmonary effort is normal.     Breath sounds: Normal breath sounds.  Abdominal:     Palpations: Abdomen is soft.     Tenderness: There is no abdominal tenderness.  Musculoskeletal:     Right lower leg: No edema.     Left lower leg: No edema.  Skin:    General: Skin is warm and dry.  Neurological:     Mental Status: She is alert.  Psychiatric:        Mood and Affect: Mood is not anxious.      ED Treatments / Results  Labs (all labs ordered are listed, but only abnormal results are displayed) Labs Reviewed  BASIC METABOLIC PANEL - Abnormal; Notable for the following components:      Result Value   CO2 21 (*)    Glucose, Bld 106 (*)    All other components within normal limits  CBC - Abnormal; Notable for the following components:   RBC 3.86 (*)    All other components within normal limits  TROPONIN I (HIGH SENSITIVITY) - Abnormal; Notable for the following components:   Troponin I (High Sensitivity) 21 (*)    All other components within normal limits  SARS CORONAVIRUS 2  TROPONIN I (HIGH SENSITIVITY)    EKG None  Radiology Dg Chest 2 View  Result Date: 04/26/2019 CLINICAL DATA:  Chest pain since Friday, worse today. EXAM: CHEST - 2 VIEW COMPARISON:  Chest x-ray dated 06/09/2017. FINDINGS: Borderline cardiomegaly, stable. Overall cardiomediastinal silhouette appears stable in size and configuration. Lungs are clear. No pleural effusion or  pneumothorax seen. Osseous structures about the chest are unremarkable. IMPRESSION: No active cardiopulmonary disease. No evidence of pneumonia or pulmonary edema. Electronically Signed   By: Franki Cabot M.D.   On: 04/26/2019 17:50    Procedures Procedures (including critical care time)  Medications Ordered in ED Medications  sodium chloride flush (NS) 0.9 % injection 3 mL (has no administration in time range)  Initial Impression / Assessment and Plan / ED Course  I have reviewed the triage vital signs and the nursing notes.  Pertinent labs & imaging results that were available during my care of the patient were reviewed by me and considered in my medical decision making (see chart for details).        Patient with concerning sounding chest pain that was relieved with nitroglycerin.  No chest pain now.  Nonischemic ECG.  She did have a negative cath recently but with a gray zone troponin, will admit per hospital protocol.  Hospitalist to admit.  She does have a history of DVT over a year ago but this does not sound like PE, and she has no hypoxia or tachycardia.  I do not think PE work-up needed currently.  Final Clinical Impressions(s) / ED Diagnoses   Final diagnoses:  Nonspecific chest pain    ED Discharge Orders    None       Sherwood Gambler, MD 04/26/19 2349

## 2019-04-26 NOTE — ED Notes (Signed)
IV team at bedside 

## 2019-04-26 NOTE — ED Notes (Signed)
Updated on wait for treatment room. 

## 2019-04-26 NOTE — ED Notes (Signed)
CT called to inform pt has proper access for CTA.

## 2019-04-26 NOTE — H&P (Addendum)
History and Physical    Karen Owens QMG:[id number] DOB: September 28, 1940 DOA: 04/26/2019  PCP: Harlan Stains, MD   Patient coming from: Home   Chief Complaint: Chest pain   HPI: Karen Owens is a 78 y.o. female with medical history significant for hyperlipidemia and left lower extremity DVT no longer on Eliquis after a GI bleed, now presenting to the emergency department for evaluation of chest pain.  Patient reports that she was in her usual state of health and having an uneventful day when she developed chest pain while at rest at approximately 3 PM.  She took 3 doses of nitroglycerin and 4 baby aspirin and the pain eased off and resolved within about 30 minutes.  There was no associated diaphoresis, nausea, or shortness of breath.  She denies any leg swelling or tenderness.  She has not been coughing.  Denies fevers, chills, or sick contacts.  Historically, patient was admitted with chest pain in September 2019, had elevated troponin, there was no coronary artery disease on catheterization, coronary vasospasm was considered, the patient was found to have acute left lower extremity DVT at that time, could not get CTA chest due to poor IV access, and had intermediate risk VQ scan.  She had been treated with Eliquis until a GI bleed.  Lower extremity Dopplers in January 2020 revealed improved, chronic LLE DVT below the knee.  ED Course: Upon arrival to the ED, patient is found to be afebrile, saturating well on room air, slightly hypertensive, and with normal heart rate and respirations.  EKG features sinus arrhythmia with LVH and PVCs.  Chest x-ray is negative for acute cardiopulmonary disease.  Chemistry panel and CBC are unremarkable.  Initial high-sensitivity troponin is 21 with a repeat in process.  COVID-19 screening test is also in process.  Hospitalists are consulted for admission.  Review of Systems:  All other systems reviewed and apart from HPI, are negative.  Past Medical History:   Diagnosis Date  . Arthritis   . Asthma   . Complication of anesthesia    difficulty waking up  . Coronary artery disease   . Refusal of blood transfusions as patient is Jehovah's Witness   . Thyroid nodule     Past Surgical History:  Procedure Laterality Date  . DILATION AND CURETTAGE OF UTERUS    . LEFT HEART CATH AND CORONARY ANGIOGRAPHY N/A 06/09/2018   Procedure: LEFT HEART CATH AND CORONARY ANGIOGRAPHY;  Surgeon: Lorretta Harp, MD;  Location: [facility];  Service: Cardiovascular;  Laterality: N/A;  . LIPOMA EXCISION     right arm  . TOTAL KNEE ARTHROPLASTY     left  . TOTAL KNEE ARTHROPLASTY  08/20/2012   right knee  . TOTAL KNEE ARTHROPLASTY  08/20/2012   Procedure: TOTAL KNEE ARTHROPLASTY;  Surgeon: Ninetta Lights, MD;  Location: [facility];  Service: Orthopedics;  Laterality: Right;     reports that she quit smoking about 36 years ago. She has never used smokeless tobacco. She reports current alcohol use. She reports that she does not use drugs.  Allergies  Allergen Reactions  . Tuberculin Tests Hives    No family history on file.   Prior to Admission medications   Medication Sig Start Date End Date Taking? Authorizing Provider  APPLE CIDER VINEGAR PO Take 250 mg by mouth daily.    [provider]  cycloSPORINE (RESTASIS MULTIDOSE) 0.05 % ophthalmic emulsion Place 1 drop into both eyes 2 (two) times daily as needed (  dry eyes).    [provider]  ibuprofen (ADVIL,MOTRIN) 800 MG tablet Take 800 mg by mouth daily as needed (inflammation/pain).  05/05/18   [provider]  nitroGLYCERIN (NITROSTAT) 0.4 MG SL tablet Place 1 tablet (0.4 mg total) under the tongue every 5 (five) minutes x 3 doses as needed. 07/09/18   Lorretta Harp, MD  Nutritional Supplements (IMMUNOCAL PO) Take 1 packet by mouth daily. Mix in juice and drink    [provider]  Omega 3-6-9 Fatty Acids (OMEGA-3-6-9 PO) Take 480 mg by mouth daily.    [provider]    Physical Exam: Vitals:   04/26/19 1701 04/26/19 1712 04/26/19 2054 04/26/19 2055  BP: (!) 148/81  (!) 172/85   Pulse: 74   69  Resp: 16   14  Temp: 98.3 F (36.8 C)     TempSrc: Oral     SpO2: 97%   100%  Weight:  104.3 kg    Height:  5\' 6"  (1.676 m)      Constitutional: NAD, calm  Eyes: PERTLA, lids and conjunctivae normal ENMT: Mucous membranes are moist. Posterior pharynx clear of any exudate or lesions.   Neck: normal, supple, no masses, no thyromegaly Respiratory: no wheezing, no crackles. Normal respiratory effort. No accessory muscle use.  Cardiovascular: S1 & S2 heard, regular rate and rhythm. No extremity edema.   Abdomen: No distension, no tenderness, soft. Bowel sounds active.  Musculoskeletal: no clubbing / cyanosis. No joint deformity upper and lower extremities.    Skin: no significant rashes, lesions, ulcers. Warm, dry, well-perfused. Neurologic: CN 2-12 grossly intact. Sensation intact. Strength 5/5 in all 4 limbs.  Psychiatric: Alert and oriented x 3. Pleasant, cooperative.    Labs on Admission: I have personally reviewed following labs and imaging studies  CBC: Recent Labs  Lab 04/26/19 1710  WBC 8.5  HGB 12.4  HCT 38.0  MCV 98.4  PLT 882   Basic Metabolic Panel: Recent Labs  Lab 04/26/19 1710  NA 140  K 3.7  CL 109  CO2 21*  GLUCOSE 106*  BUN 18  CREATININE 0.86  CALCIUM 10.2   GFR: Estimated Creatinine Clearance: 67.9 mL/min (by C-G formula based on SCr of 0.86 mg/dL). Liver Function Tests: No results for input(s): AST, ALT, ALKPHOS, BILITOT, PROT, ALBUMIN in the last 168 hours. No results for input(s): LIPASE, AMYLASE in the last 168 hours. No results for input(s): AMMONIA in the last 168 hours. Coagulation Profile: No results for input(s): INR, PROTIME in the last 168 hours. Cardiac Enzymes: No results for input(s): CKTOTAL, CKMB, CKMBINDEX, TROPONINI in the last 168 hours. BNP (last 3 results) No results for  input(s): PROBNP in the last 8760 hours. HbA1C: No results for input(s): HGBA1C in the last 72 hours. CBG: No results for input(s): GLUCAP in the last 168 hours. Lipid Profile: No results for input(s): CHOL, HDL, LDLCALC, TRIG, CHOLHDL, LDLDIRECT in the last 72 hours. Thyroid Function Tests: No results for input(s): TSH, T4TOTAL, FREET4, T3FREE, THYROIDAB in the last 72 hours. Anemia Panel: No results for input(s): VITAMINB12, FOLATE, FERRITIN, TIBC, IRON, RETICCTPCT in the last 72 hours. Urine analysis:    Component Value Date/Time   COLORURINE YELLOW 08/15/2012 1140   APPEARANCEUR HAZY (A) 08/15/2012 1140   LABSPEC 1.029 08/15/2012 1140   PHURINE 5.0 08/15/2012 1140   GLUCOSEU NEGATIVE 08/15/2012 1140   HGBUR NEGATIVE 08/15/2012 1140   BILIRUBINUR NEGATIVE 08/15/2012 1140   KETONESUR NEGATIVE 08/15/2012 1140   PROTEINUR  NEGATIVE 08/15/2012 1140   UROBILINOGEN 0.2 08/15/2012 1140   NITRITE NEGATIVE 08/15/2012 1140   LEUKOCYTESUR SMALL (A) 08/15/2012 1140   Sepsis Labs: @LABRCNTIP (procalcitonin:4,lacticidven:4) )No results found for this or any previous visit (from the past 240 hour(s)).   Radiological Exams on Admission: Dg Chest 2 View  Result Date: 04/26/2019 CLINICAL DATA:  Chest pain since Friday, worse today. EXAM: CHEST - 2 VIEW COMPARISON:  Chest x-ray dated 06/09/2017. FINDINGS: Borderline cardiomegaly, stable. Overall cardiomediastinal silhouette appears stable in size and configuration. Lungs are clear. No pleural effusion or pneumothorax seen. Osseous structures about the chest are unremarkable. IMPRESSION: No active cardiopulmonary disease. No evidence of pneumonia or pulmonary edema. Electronically Signed   By: Franki Cabot M.D.   On: 04/26/2019 17:50    EKG: Independently reviewed. Sinus arrhythmia, PVC's, LVH.   Assessment/Plan   1. Chest pain  - Presents after an episode of chest pain that began while at rest and resolved within ~30 minutes after taking ASA  324 mg and NTG x3  - EKG with sinus arrhythmia and PVC's but no acute ST-T abnormalities; CXR without acute findings; initial HS-troponin 21  - She had normal coronaries arteries in September 2019  - She has chronic LLE DVT, not hypoxic or coughing, and not tachycardic  - With normal coronaries on cath last year, ACS d/t obstructive CAD unlikely, vasospasm possible, PE unlikely but given LLE DVT will rule-out PE  - Continue cardiac monitoring, trend troponin, check d-dimer (and CTA chest if positive), continue ASA and [place], repeat EKG    ADDENDUM: Second troponin has come down, age-adjusted d-dimer positive, proceed with CTA chest.   2. LLE DVT  - LLE DVT diagnosed in September 2019 after she had prolonged immobilization while traveling   - She had been on Eliquis until a GIB and subsequent venous US demonstrated improvement with chronic DVT below the knee  - She is being ruled-out for PE as above     PPE: Mask, face shield  DVT prophylaxis: Lovenox  Code Status: Full  Family Communication: Discussed with patient  Consults called: None  Admission status: Observation     Vianne Bulls, MD [facility] Pager [phone]  If 7PM-7AM, please contact night-coverage [email or web address] Password [facility]  04/26/2019, 9:35 PM

## 2019-04-27 ENCOUNTER — Observation Stay (HOSPITAL_COMMUNITY): Payer: Medicare HMO

## 2019-04-27 ENCOUNTER — Other Ambulatory Visit: Payer: Self-pay

## 2019-04-27 ENCOUNTER — Encounter (HOSPITAL_COMMUNITY): Payer: Self-pay | Admitting: Radiology

## 2019-04-27 DIAGNOSIS — Z87891 Personal history of nicotine dependence: Secondary | ICD-10-CM | POA: Diagnosis not present

## 2019-04-27 DIAGNOSIS — I201 Angina pectoris with documented spasm: Secondary | ICD-10-CM

## 2019-04-27 DIAGNOSIS — Z86718 Personal history of other venous thrombosis and embolism: Secondary | ICD-10-CM | POA: Diagnosis not present

## 2019-04-27 DIAGNOSIS — I82402 Acute embolism and thrombosis of unspecified deep veins of left lower extremity: Secondary | ICD-10-CM

## 2019-04-27 DIAGNOSIS — R7989 Other specified abnormal findings of blood chemistry: Secondary | ICD-10-CM | POA: Diagnosis not present

## 2019-04-27 DIAGNOSIS — Z20828 Contact with and (suspected) exposure to other viral communicable diseases: Secondary | ICD-10-CM | POA: Diagnosis not present

## 2019-04-27 DIAGNOSIS — R0789 Other chest pain: Secondary | ICD-10-CM | POA: Diagnosis not present

## 2019-04-27 DIAGNOSIS — R079 Chest pain, unspecified: Secondary | ICD-10-CM

## 2019-04-27 LAB — BASIC METABOLIC PANEL
Anion gap: 8 (ref 5–15)
BUN: 14 mg/dL (ref 8–23)
CO2: 23 mmol/L (ref 22–32)
Calcium: 9.9 mg/dL (ref 8.9–10.3)
Chloride: 109 mmol/L (ref 98–111)
Creatinine, Ser: 0.74 mg/dL (ref 0.44–1.00)
GFR calc Af Amer: >60 mL/min (ref >60)
GFR calc non Af Amer: >60 mL/min (ref >60)
Glucose, Bld: 92 mg/dL (ref 70–99)
Potassium: 3.6 mmol/L (ref 3.5–5.1)
Sodium: 140 mmol/L (ref 135–145)

## 2019-04-27 LAB — CBC
HCT: 37.7 % (ref 36.0–46.0)
Hemoglobin: 12.5 g/dL (ref 12.0–15.0)
MCH: 31.6 pg (ref 26.0–34.0)
MCHC: 33.2 g/dL (ref 30.0–36.0)
MCV: 95.2 fL (ref 80.0–100.0)
Platelets: 232 10*3/uL (ref 150–400)
RBC: 3.96 MIL/uL (ref 3.87–5.11)
RDW: 12.9 % (ref 11.5–15.5)
WBC: 7.5 10*3/uL (ref 4.0–10.5)
nRBC: 0 % (ref 0.0–0.2)

## 2019-04-27 LAB — T4, FREE: Free T4: 0.66 ng/dL (ref 0.61–1.12)

## 2019-04-27 LAB — TSH: TSH: 1.788 u[IU]/mL (ref 0.350–4.500)

## 2019-04-27 LAB — SARS CORONAVIRUS 2 (TAT 6-24 HRS): SARS Coronavirus 2: NEGATIVE

## 2019-04-27 MED ORDER — ACETAMINOPHEN 325 MG PO TABS: 650.0000 mg | ORAL_TABLET | ORAL | Status: DC | PRN

## 2019-04-27 MED ORDER — ASPIRIN EC 81 MG PO TBEC
81.0000 mg | DELAYED_RELEASE_TABLET | Freq: Every day | ORAL | Status: DC
  Administered 2019-04-27: 81 mg via ORAL
  Filled 2019-04-27: qty 1

## 2019-04-27 MED ORDER — ASPIRIN 81 MG PO CHEW: 324.0000 mg | CHEWABLE_TABLET | ORAL | Status: DC | PRN

## 2019-04-27 MED ORDER — ENOXAPARIN SODIUM 40 MG/0.4ML ~~LOC~~ SOLN
40.0000 mg | SUBCUTANEOUS | Status: DC
  Administered 2019-04-27: 40 mg via SUBCUTANEOUS
  Filled 2019-04-27: qty 0.4

## 2019-04-27 MED ORDER — OMEGA-3-ACID ETHYL ESTERS 1 G PO CAPS
1.0000 | ORAL_CAPSULE | Freq: Every day | ORAL | Status: DC
  Administered 2019-04-27: 1 g via ORAL
  Filled 2019-04-27: qty 1

## 2019-04-27 MED ORDER — SODIUM CHLORIDE 0.9% FLUSH: 3.0000 mL | INTRAVENOUS | Status: DC | PRN

## 2019-04-27 MED ORDER — IOHEXOL 350 MG/ML SOLN
75.0000 mL | Freq: Once | INTRAVENOUS | Status: AC | PRN
  Administered 2019-04-27: 75 mL via INTRAVENOUS

## 2019-04-27 MED ORDER — SODIUM CHLORIDE 0.9% FLUSH
3.0000 mL | Freq: Two times a day (BID) | INTRAVENOUS | Status: DC
  Administered 2019-04-27 (×2): 3 mL via INTRAVENOUS

## 2019-04-27 MED ORDER — SODIUM CHLORIDE 0.9 % IV SOLN: 250.0000 mL | INTRAVENOUS | Status: DC | PRN

## 2019-04-27 MED ORDER — NITROGLYCERIN 0.4 MG SL SUBL: 0.4000 mg | SUBLINGUAL_TABLET | SUBLINGUAL | Status: DC | PRN

## 2019-04-27 MED ORDER — ONDANSETRON HCL 4 MG/2ML IJ SOLN: 4.0000 mg | Freq: Four times a day (QID) | INTRAMUSCULAR | Status: DC | PRN

## 2019-04-27 NOTE — Progress Notes (Signed)
SATURATION QUALIFICATIONS: (This note is used to comply with regulatory documentation for home oxygen)  Patient Saturations on Room Air at Rest = 100%  Patient Saturations on Room Air while Ambulating = 96%  Patient Saturations on N/A Liters of oxygen while Ambulating = N/A%  Please briefly explain why patient needs home oxygen: Pt ambulated the length of the hallway with no problems of chest pain or feelings of SOB. Pt does not need home oxygen.

## 2019-04-27 NOTE — Discharge Summary (Addendum)
Physician Discharge Summary  Karen Owens MGN:[id number] DOB: August 25, 1941 DOA: 04/26/2019  PCP: Harlan Stains, MD  Admit date: 04/26/2019 Discharge date: 04/27/2019  Admitted From: Home Disposition: Home  Recommendations for Outpatient Follow-up:  1. Follow up with PCP in 1-2 weeks 2. Please obtain BMP/CBC in one week  Discharge Condition: Stable CODE STATUS: Full Diet recommendation: Cardiac prudent low-salt, low-fat  Brief/Interim Summary: Karen Owens is a 78 y.o. female with medical history significant for hyperlipidemia and left lower extremity DVT no longer on Eliquis after a GI bleed, now presenting to the emergency department for evaluation of chest pain.  Patient reports that she was in her usual state of health and having an uneventful day when she developed chest pain while at rest at approximately 3 PM.  She took 3 doses of nitroglycerin and 4 baby aspirin and the pain eased off and resolved within about 30 minutes.  There was no associated diaphoresis, nausea, or shortness of breath.  She denies any leg swelling or tenderness.  She has not been coughing. Denies fevers, chills, or sick contacts. Historically, patient was admitted with chest pain in September 2019, had elevated troponin, there was no coronary artery disease on catheterization, coronary vasospasm was considered, the patient was found to have acute left lower extremity DVT at that time, could not get CTA chest due to poor IV access, and had intermediate risk VQ scan.  She had been treated with Eliquis until a GI bleed.  Lower extremity Dopplers in January 2020 revealed improved, chronic LLE DVT below the knee. In ED, patient is found to be afebrile, saturating well on room air, slightly hypertensive, and with normal heart rate and respirations.  EKG features sinus arrhythmia with LVH and PVCs.  Chest x-ray is negative for acute cardiopulmonary disease.  Chemistry panel and CBC are unremarkable.  Initial high-sensitivity  troponin is 21 with a repeat in process.  COVID-19 screening test is also in process.  Hospitalists are consulted for admission.  Patient admitted as above with atypical chest pain, appears to be fleeting in nature on my interview.  Patient declined any associated symptoms, now feels quite well, has felt back to baseline within 5 minutes of chest pain yesterday, but was concerned given her history this was something more ominous. Patient does have history of somewhat chronic DVT as above but had failed previous outpatient anticoagulation due to recurrent GI bleeds, DVT appears to be resolving on repeat imaging earlier this year, reassuring that this is unlikely embolic in nature.   He discussion today at bedside with both her and her daughter who is a nurse that this is likely Prinzmetal's angina or GI in nature, given CTA as below was negative for PE/infiltrate/effusion, troponin remains essentially unremarkable, EKG without overt ST elevations.  Patient's asymptomatic nature, reassuring labs and imaging will discharge home with close follow-up with PCP and cardiology in the next 1 to 2 weeks as scheduled.    Discharge Diagnoses:  Principal Problem:   Chest pain Active Problems:   DVT (deep venous thrombosis) (HCC)   Prinzmetal variant angina [facility])  Discharge Instructions  Discharge Instructions    Diet - low sodium heart healthy   Complete by: As directed    Increase activity slowly   Complete by: As directed      Allergies as of 04/27/2019      Reactions   Tuberculin Tests Hives   Other Other (See Comments)   Jehovah's Witness- NO BLOOD PRODUCTS!!  Medication List    TAKE these medications   APPLE CIDER VINEGAR PO Take 250 mg by mouth daily.   aspirin 81 MG chewable tablet Chew 324 mg by mouth as needed (for chest pain).   ibuprofen 800 MG tablet Commonly known as: ADVIL Take 800 mg by mouth every 8 (eight) hours as needed (inflammation/pain).   IMMUNOCAL PO Take 1  packet by mouth daily. Mix in juice and drink   nitroGLYCERIN 0.4 MG SL tablet Commonly known as: NITROSTAT Place 1 tablet (0.4 mg total) under the tongue every 5 (five) minutes x 3 doses as needed. What changed: reasons to take this   OMEGA-3-6-9 PO Take 480 mg by mouth daily.   Refresh Tears 0.5 % Soln Generic drug: carboxymethylcellulose Place 1 drop into both eyes 3 (three) times daily as needed (for dryness).       Allergies  Allergen Reactions  . Tuberculin Tests Hives  . Other Other (See Comments)    Jehovah's Witness- NO BLOOD PRODUCTS!!    Consultations:  None   Procedures/Studies: Dg Chest 2 View  Result Date: 04/26/2019 CLINICAL DATA:  Chest pain since Friday, worse today. EXAM: CHEST - 2 VIEW COMPARISON:  Chest x-ray dated 06/09/2017. FINDINGS: Borderline cardiomegaly, stable. Overall cardiomediastinal silhouette appears stable in size and configuration. Lungs are clear. No pleural effusion or pneumothorax seen. Osseous structures about the chest are unremarkable. IMPRESSION: No active cardiopulmonary disease. No evidence of pneumonia or pulmonary edema. Electronically Signed   By: Franki Cabot M.D.   On: 04/26/2019 17:50   Ct Angio Chest Pe W Or Wo Contrast  Result Date: 04/27/2019 CLINICAL DATA:  PE suspected, intermediate probability, positive D-dimer EXAM: CT ANGIOGRAPHY CHEST WITH CONTRAST TECHNIQUE: Multidetector CT imaging of the chest was performed using the standard protocol during bolus administration of intravenous contrast. Multiplanar CT image reconstructions and MIPs were obtained to evaluate the vascular anatomy. CONTRAST:  66 mL OMNIPAQUE IOHEXOL 350 MG/ML SOLN COMPARISON:  Chest radiograph April 26, 2019 FINDINGS: Cardiovascular: Satisfactory opacification of the pulmonary arteries to the segmental level. No evidence of pulmonary embolism. No pulmonary arterial enlargement. Normal heart size. No pericardial effusion. Atherosclerotic plaque within the  normal caliber aorta. Atherosclerotic calcification of the coronary arteries. Additional calcifications seen in the great vessels. Mediastinum/Nodes: Enlarged, heterogeneous, multinodular thyroid which extends inferiorly into the thoracic inlet and into both the anterior and middle mediastinum. Airways unremarkable for exhalation phase of imaging. Esophagus is free of acute abnormality. Lungs/Pleura: Small amount of ground-glass opacity in the medial basal segment of the right lower lobe is likely atelectatic in nature. Air cysts are seen in the superior segment left lower lobe and posterior segment left upper lobe. No consolidation, features of edema, pneumothorax, or effusion. No suspicious pulmonary nodules or masses. Upper Abdomen: No acute abnormalities present in the visualized portions of the upper abdomen. Musculoskeletal: Multilevel degenerative changes are present in the imaged portions of the spine. Right acromioclavicular arthrosis is noted. No acute osseous abnormality or suspicious osseous lesion. No suspicious chest wall lesions. Review of the MIP images confirms the above findings. IMPRESSION: 1. No evidence of pulmonary embolism. No acute intrathoracic process. 2. Aortic Atherosclerosis (ICD10-I70.0). 3. Coronary atherosclerosis. 4. Heterogeneous enlarged multinodular thyroid gland extending inferiorly through the thoracic inlet. May reflect a multinodular goiter. Could correlate with thyroid serologies and nonemergent thyroid ultrasound. Electronically Signed   By: Lovena Le M.D.   On: 04/27/2019 00:44    Subjective: No acute issues or events overnight, chest pain resolved  prior to admission as above, no recurrence here, denies nausea, vomiting, diarrhea, constipation, headache, fevers, chills.  Discharge Exam: Vitals:   04/27/19 0055 04/27/19 0550  BP: 124/89 (!) 144/73  Pulse: 83 70  Resp: 18 16  Temp: 97.8 F (36.6 C) 97.8 F (36.6 C)  SpO2: 98% 95%   Vitals:   04/26/19 2345  04/27/19 0000 04/27/19 0055 04/27/19 0550  BP: 113/69 103/62 124/89 (!) 144/73  Pulse: 84 82 83 70  Resp:   18 16  Temp:   97.8 F (36.6 C) 97.8 F (36.6 C)  TempSrc:   Oral Oral  SpO2: 99% 99% 98% 95%  Weight:    106.5 kg  Height:    5\' 6"  (1.676 m)    General:  Pleasantly resting in bed, No acute distress. HEENT:  Normocephalic atraumatic.  Sclerae nonicteric, noninjected.  Extraocular movements intact bilaterally. Neck:  Without mass or deformity.  Trachea is midline. Lungs:  Clear to auscultate bilaterally without rhonchi, wheeze, or rales. Heart:  Regular rate and rhythm.  Without murmurs, rubs, or gallops. Abdomen:  Soft, nontender, nondistended.  Without guarding or rebound. Extremities: Without cyanosis, clubbing, edema, or obvious deformity. Vascular:  Dorsalis pedis and posterior tibial pulses palpable bilaterally. Skin:  Warm and dry, no erythema, no ulcerations.   The results of significant diagnostics from this hospitalization (including imaging, microbiology, ancillary and laboratory) are listed below for reference.     Microbiology: Recent Results (from the past 240 hour(s))  SARS CORONAVIRUS 2 Nasal Swab Aptima Multi Swab     Status: None   Collection Time: 04/26/19  9:36 PM   Specimen: Aptima Multi Swab; Nasal Swab  Result Value Ref Range Status   SARS Coronavirus 2 NEGATIVE NEGATIVE Final    Comment: (NOTE) SARS-CoV-2 target nucleic acids are NOT DETECTED. The SARS-CoV-2 RNA is generally detectable in upper and lower respiratory specimens during the acute phase of infection. Negative results do not preclude SARS-CoV-2 infection, do not rule out co-infections with other pathogens, and should not be used as the sole basis for treatment or other patient management decisions. Negative results must be combined with clinical observations, patient history, and epidemiological information. The expected result is Negative. Fact Sheet for  Patients: SugarRoll.be Fact Sheet for Healthcare Providers: https://www.woods-mathews.com/ This test is not yet approved or cleared by the [place] FDA and  has been authorized for detection and/or diagnosis of SARS-CoV-2 by FDA under an Emergency Use Authorization (EUA). This EUA will remain  in effect (meaning this test can be used) for the duration of the COVID-19 declaration under Section 56 4(b)(1) of the Act, 21 U.S.C. section 360bbb-3(b)(1), unless the authorization is terminated or revoked sooner. Performed at [facility], [place] [address], [place], [place] [address]      Labs: BNP (last 3 results) No results for input(s): BNP in the last 8760 hours. Basic Metabolic Panel: Recent Labs  Lab 04/26/19 1710 04/27/19 0856  NA 140 140  K 3.7 3.6  CL 109 109  CO2 21* 23  GLUCOSE 106* 92  BUN 18 14  CREATININE 0.86 0.74  CALCIUM 10.2 9.9   Liver Function Tests: No results for input(s): AST, ALT, ALKPHOS, BILITOT, PROT, ALBUMIN in the last 168 hours. No results for input(s): LIPASE, AMYLASE in the last 168 hours. No results for input(s): AMMONIA in the last 168 hours. CBC: Recent Labs  Lab 04/26/19 1710 04/27/19 0856  WBC 8.5 7.5  HGB 12.4 12.5  HCT 38.0 37.7  MCV 98.4 95.2  PLT 223 232   Cardiac Enzymes: No results for input(s): CKTOTAL, CKMB, CKMBINDEX, TROPONINI in the last 168 hours. BNP: Invalid input(s): POCBNP CBG: No results for input(s): GLUCAP in the last 168 hours. D-Dimer Recent Labs    04/26/19 1710  DDIMER 1.33*   Hgb A1c No results for input(s): HGBA1C in the last 72 hours. Lipid Profile No results for input(s): CHOL, HDL, LDLCALC, TRIG, CHOLHDL, LDLDIRECT in the last 72 hours. Thyroid function studies Recent Labs    04/27/19 0856  TSH 1.788   Anemia work up No results for input(s): VITAMINB12, FOLATE, FERRITIN, TIBC, IRON, RETICCTPCT in the last 72 hours. Urinalysis    Component  Value Date/Time   COLORURINE YELLOW 08/15/2012 1140   APPEARANCEUR HAZY (A) 08/15/2012 1140   LABSPEC 1.029 08/15/2012 1140   PHURINE 5.0 08/15/2012 1140   GLUCOSEU NEGATIVE 08/15/2012 1140   HGBUR NEGATIVE 08/15/2012 1140   BILIRUBINUR NEGATIVE 08/15/2012 1140   KETONESUR NEGATIVE 08/15/2012 1140   PROTEINUR NEGATIVE 08/15/2012 1140   UROBILINOGEN 0.2 08/15/2012 1140   NITRITE NEGATIVE 08/15/2012 1140   LEUKOCYTESUR SMALL (A) 08/15/2012 1140   Sepsis Labs Invalid input(s): PROCALCITONIN,  WBC,  LACTICIDVEN Microbiology Recent Results (from the past 240 hour(s))  SARS CORONAVIRUS 2 Nasal Swab Aptima Multi Swab     Status: None   Collection Time: 04/26/19  9:36 PM   Specimen: Aptima Multi Swab; Nasal Swab  Result Value Ref Range Status   SARS Coronavirus 2 NEGATIVE NEGATIVE Final    Comment: (NOTE) SARS-CoV-2 target nucleic acids are NOT DETECTED. The SARS-CoV-2 RNA is generally detectable in upper and lower respiratory specimens during the acute phase of infection. Negative results do not preclude SARS-CoV-2 infection, do not rule out co-infections with other pathogens, and should not be used as the sole basis for treatment or other patient management decisions. Negative results must be combined with clinical observations, patient history, and epidemiological information. The expected result is Negative. Fact Sheet for Patients: SugarRoll.be Fact Sheet for Healthcare Providers: https://www.woods-mathews.com/ This test is not yet approved or cleared by the [place] FDA and  has been authorized for detection and/or diagnosis of SARS-CoV-2 by FDA under an Emergency Use Authorization (EUA). This EUA will remain  in effect (meaning this test can be used) for the duration of the COVID-19 declaration under Section 56 4(b)(1) of the Act, 21 U.S.C. section 360bbb-3(b)(1), unless the authorization is terminated or revoked  sooner. Performed at [facility], [place] [address] [place],  [address]      Time coordinating discharge: Over 30 minutes  SIGNED:   Little Ishikawa, DO [facility] 04/27/2019, 11:16 AM Pager   If 7PM-7AM, please contact night-coverage [email or web address] Password [email or web address]

## 2019-04-27 NOTE — ED Notes (Signed)
Patient transported to CT 

## 2019-05-07 DIAGNOSIS — Z23 Encounter for immunization: Secondary | ICD-10-CM | POA: Diagnosis not present

## 2019-05-15 ENCOUNTER — Telehealth: Payer: Self-pay | Admitting: Cardiovascular Disease

## 2019-05-15 NOTE — Telephone Encounter (Signed)
New Message     Patient would like to bring daughter to appt with her.  Please call to discuss.

## 2019-05-15 NOTE — Telephone Encounter (Signed)
Left detailed message, pt only in appt and make sure to wear a mask

## 2019-05-20 ENCOUNTER — Other Ambulatory Visit: Payer: Self-pay

## 2019-05-20 ENCOUNTER — Encounter: Payer: Self-pay | Admitting: Cardiovascular Disease

## 2019-05-20 ENCOUNTER — Ambulatory Visit (INDEPENDENT_AMBULATORY_CARE_PROVIDER_SITE_OTHER): Payer: Medicare HMO | Admitting: Cardiovascular Disease

## 2019-05-20 VITALS — BP 165/94 | HR 90 | Ht 66.0 in | Wt 234.4 lb

## 2019-05-20 DIAGNOSIS — I82402 Acute embolism and thrombosis of unspecified deep veins of left lower extremity: Secondary | ICD-10-CM | POA: Diagnosis not present

## 2019-05-20 DIAGNOSIS — R030 Elevated blood-pressure reading, without diagnosis of hypertension: Secondary | ICD-10-CM

## 2019-05-20 DIAGNOSIS — I214 Non-ST elevation (NSTEMI) myocardial infarction: Secondary | ICD-10-CM

## 2019-05-20 DIAGNOSIS — E782 Mixed hyperlipidemia: Secondary | ICD-10-CM

## 2019-05-20 NOTE — Progress Notes (Signed)
05/20/2019 Karen Owens   [birth year]  [id number]  Primary Physician Harlan Stains, MD Primary Cardiologist: Lorretta Harp MD Lupe Carney, [place]  HPI:  Karen Owens is a 78 y.o.  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 Troponin rose to approximately 1. Her echo did show anteroapical wall motion abnormality. I performed radial diagnostic catheter revealed essentially normal coronary arteries. Subsequent d-dimer was positive at around 9 with a VQ scan was intermediate probability and subsequent venous Dopplers that showed acute left lower extremity DVT for which she was placed on Eliquis oral anticoagulation.   She was seen in the emergency room for melena. Hemoglobin was mildly reduced. Her Eliquis was discontinued.   Since I saw her 3 months she was hospitalized for 1 day in August for atypical chest pain.  Enzymes were negative and she had no EKG changes.  Since that time she says she has more energy and denies chest pain.    Current Meds  Medication Sig  . APPLE CIDER VINEGAR PO Take 250 mg by mouth daily.  Marland Kitchen aspirin 81 MG chewable tablet Chew 324 mg by mouth as needed (for chest pain).  . carboxymethylcellulose (REFRESH TEARS) 0.5 % SOLN Place 1 drop into both eyes 3 (three) times daily as needed (for dryness).  Marland Kitchen ibuprofen (ADVIL,MOTRIN) 800 MG tablet Take 800 mg by mouth every 8 (eight) hours as needed (inflammation/pain).   . nitroGLYCERIN (NITROSTAT) 0.4 MG SL tablet Place 1 tablet (0.4 mg total) under the tongue every 5 (five) minutes x 3 doses as needed. (Patient taking differently: Place 0.4 mg under the tongue every 5 (five) minutes x 3 doses as needed for  chest pain. )  . Nutritional Supplements (IMMUNOCAL PO) Take 1 packet by mouth daily. Mix in juice and drink  . Omega 3-6-9 Fatty Acids (OMEGA-3-6-9 PO) Take 480 mg by mouth daily.     Allergies  Allergen Reactions  . Tuberculin Tests Hives  . Other Other (See Comments)    Jehovah's Witness- NO BLOOD PRODUCTS!!    Social History   Socioeconomic History  . Marital status: Divorced    Spouse name: Not on file  . Number of children: Not on file  . Years of education: Not on file  . Highest education level: Not on file  Occupational History  . Not on file  Social Needs  . Financial resource strain: Not on file  . Food insecurity    Worry: Not on file    Inability: Not on file  . Transportation needs    Medical: Not on file    Non-medical: Not on file  Tobacco Use  . Smoking status: Former Smoker    Quit date: 09/10/1982    Years since quitting: 36.7  . Smokeless tobacco: Never Used  Substance and Sexual Activity  . Alcohol use: Yes    Comment: occasional  . Drug use: No  . Sexual activity: Never    Birth control/protection: Post-menopausal  Lifestyle  . Physical activity    Days per week: Not on file    Minutes per session: Not on file  . Stress: Not on file  Relationships  . Social connections    Talks on phone: Not on file  Gets together: Not on file    Attends religious service: Not on file    Active member of club or organization: Not on file    Attends meetings of clubs or organizations: Not on file    Relationship status: Not on file  . Intimate partner violence    Fear of current or ex partner: Not on file    Emotionally abused: Not on file    Physically abused: Not on file    Forced sexual activity: Not on file  Other Topics Concern  . Not on file  Social History Narrative  . Not on file     Review of Systems: General: negative for chills, fever, night sweats or weight changes.  Cardiovascular: negative for chest pain, dyspnea on exertion, edema,  orthopnea, palpitations, paroxysmal nocturnal dyspnea or shortness of breath Dermatological: negative for rash Respiratory: negative for cough or wheezing Urologic: negative for hematuria Abdominal: negative for nausea, vomiting, diarrhea, bright red blood per rectum, melena, or hematemesis Neurologic: negative for visual changes, syncope, or dizziness All other systems reviewed and are otherwise negative except as noted above.    Blood pressure (!) 165/94, pulse 90, height 5\' 6"  (1.676 m), weight 234 lb 6.4 oz (106.3 kg).  General appearance: alert and no distress Neck: no adenopathy, no carotid bruit, no JVD, supple, symmetrical, trachea midline and thyroid not enlarged, symmetric, no tenderness/mass/nodules Lungs: clear to auscultation bilaterally Heart: regular rate and rhythm, S1, S2 normal, no murmur, click, rub or gallop Extremities: extremities normal, atraumatic, no cyanosis or edema Pulses: 2+ and symmetric Skin: Skin color, texture, turgor normal. No rashes or lesions Neurologic: Alert and oriented X 3, normal strength and tone. Normal symmetric reflexes. Normal coordination and gait  EKG not performed today  ASSESSMENT AND PLAN:   ELEVATED BLOOD PRESSURE WITHOUT DIAGNOSIS OF HYPERTENSION Blood pressure today is 165/94 although she says it usually runs lower than this and prior blood pressures support this.  She is not on antihypertensive medications.  NSTEMI (non-ST elevated myocardial infarction) [facility]) History of non-STEMI 06/06/2018 with cardiac cath revealing normal coronary arteries.  DVT (deep venous thrombosis) (HCC) History of DVT and pulmonary embolism in the past on Eliquis oral anticoagulation which was discontinued because of melena.  HLD (hyperlipidemia) History of hyperlipidemia not on statin therapy with lipid profile performed 06/07/2018 revealing total cholesterol 199, LDL 129 and HDL of 65.  We will recheck a lipid liver profile.      Lorretta Harp MD FACP,FACC,FAHA, [facility] 05/20/2019 3:33 PM

## 2019-05-20 NOTE — Assessment & Plan Note (Signed)
Blood pressure today is 165/94 although she says it usually runs lower than this and prior blood pressures support this.  She is not on antihypertensive medications.

## 2019-05-20 NOTE — Assessment & Plan Note (Signed)
History of non-STEMI 06/06/2018 with cardiac cath revealing normal coronary arteries.

## 2019-05-20 NOTE — Patient Instructions (Addendum)
Medication Instructions:  Your physician recommends that you continue on your current medications as directed. Please refer to the Current Medication list given to you today.  If you need a refill on your cardiac medications before your next appointment, please call your pharmacy.   Lab work: Your physician recommends that you return for lab work within 1 week: fasting lipid and liver panels  If you have labs (blood work) drawn today and your tests are completely normal, you will receive your results only by: Marland Kitchen MyChart Message (if you have MyChart) OR . A paper copy in the mail If you have any lab test that is abnormal or we need to change your treatment, we will call you to review the results.  Testing/Procedures: none  Follow-Up: At [facility], you and your health needs are our priority.  As part of our continuing mission to provide you with exceptional heart care, we have created designated Provider Care Teams.  These Care Teams include your primary Cardiologist (physician) and Advanced Practice Providers (APPs -  Physician Assistants and Nurse Practitioners) who all work together to provide you with the care you need, when you need it. . You will need a follow up appointment in 6 months with an APP and in 12 months with Dr. Quay Burow.  Please call our office 2 months in advance to schedule each appointment.  You may see one of the following Advanced Practice Providers on your designated Care Team:   . [name], PA-C . Daleen Snook Kroeger, PA-C . Sande Rives, PA-C . Almyra Deforest, PA-C . Fabian Sharp, PA-C . Jory Sims, DNP . Rosaria Ferries, PA-C

## 2019-05-20 NOTE — Assessment & Plan Note (Signed)
History of hyperlipidemia not on statin therapy with lipid profile performed 06/07/2018 revealing total cholesterol 199, LDL 129 and HDL of 65.  We will recheck a lipid liver profile.

## 2019-05-20 NOTE — Assessment & Plan Note (Signed)
History of DVT and pulmonary embolism in the past on Eliquis oral anticoagulation which was discontinued because of melena.

## 2019-06-29 DIAGNOSIS — E042 Nontoxic multinodular goiter: Secondary | ICD-10-CM | POA: Diagnosis not present

## 2019-06-29 DIAGNOSIS — I10 Essential (primary) hypertension: Secondary | ICD-10-CM | POA: Diagnosis not present

## 2019-06-29 DIAGNOSIS — Z0000 Encounter for general adult medical examination without abnormal findings: Secondary | ICD-10-CM | POA: Diagnosis not present

## 2019-06-29 DIAGNOSIS — E2839 Other primary ovarian failure: Secondary | ICD-10-CM | POA: Diagnosis not present

## 2019-06-29 DIAGNOSIS — E785 Hyperlipidemia, unspecified: Secondary | ICD-10-CM | POA: Diagnosis not present

## 2019-06-29 DIAGNOSIS — R438 Other disturbances of smell and taste: Secondary | ICD-10-CM | POA: Diagnosis not present

## 2019-06-29 DIAGNOSIS — Z23 Encounter for immunization: Secondary | ICD-10-CM | POA: Diagnosis not present

## 2019-07-07 DIAGNOSIS — E785 Hyperlipidemia, unspecified: Secondary | ICD-10-CM | POA: Diagnosis not present

## 2019-07-07 DIAGNOSIS — E042 Nontoxic multinodular goiter: Secondary | ICD-10-CM | POA: Diagnosis not present

## 2019-07-07 DIAGNOSIS — Z23 Encounter for immunization: Secondary | ICD-10-CM | POA: Diagnosis not present

## 2019-07-07 DIAGNOSIS — I10 Essential (primary) hypertension: Secondary | ICD-10-CM | POA: Diagnosis not present

## 2019-07-12 ENCOUNTER — Encounter (HOSPITAL_COMMUNITY): Payer: Self-pay

## 2019-07-12 ENCOUNTER — Emergency Department (HOSPITAL_COMMUNITY): Payer: Medicare HMO

## 2019-07-12 ENCOUNTER — Other Ambulatory Visit: Payer: Self-pay

## 2019-07-12 ENCOUNTER — Inpatient Hospital Stay (HOSPITAL_COMMUNITY)
Admission: EM | Admit: 2019-07-12 | Discharge: 2019-07-15 | DRG: 176 | Disposition: A | Discharge status: Home / Self Care | Payer: Medicare HMO | Attending: Internal Medicine | Admitting: Internal Medicine

## 2019-07-12 DIAGNOSIS — E785 Hyperlipidemia, unspecified: Secondary | ICD-10-CM | POA: Diagnosis not present

## 2019-07-12 DIAGNOSIS — F419 Anxiety disorder, unspecified: Secondary | ICD-10-CM

## 2019-07-12 DIAGNOSIS — R0902 Hypoxemia: Secondary | ICD-10-CM | POA: Diagnosis not present

## 2019-07-12 DIAGNOSIS — R52 Pain, unspecified: Secondary | ICD-10-CM | POA: Diagnosis not present

## 2019-07-12 DIAGNOSIS — Z96653 Presence of artificial knee joint, bilateral: Secondary | ICD-10-CM | POA: Diagnosis present

## 2019-07-12 DIAGNOSIS — R0602 Shortness of breath: Secondary | ICD-10-CM | POA: Diagnosis not present

## 2019-07-12 DIAGNOSIS — Z86718 Personal history of other venous thrombosis and embolism: Secondary | ICD-10-CM

## 2019-07-12 DIAGNOSIS — Z8249 Family history of ischemic heart disease and other diseases of the circulatory system: Secondary | ICD-10-CM

## 2019-07-12 DIAGNOSIS — E042 Nontoxic multinodular goiter: Secondary | ICD-10-CM | POA: Diagnosis present

## 2019-07-12 DIAGNOSIS — Z86711 Personal history of pulmonary embolism: Secondary | ICD-10-CM | POA: Diagnosis not present

## 2019-07-12 DIAGNOSIS — Z20828 Contact with and (suspected) exposure to other viral communicable diseases: Secondary | ICD-10-CM | POA: Diagnosis not present

## 2019-07-12 DIAGNOSIS — R079 Chest pain, unspecified: Secondary | ICD-10-CM | POA: Diagnosis not present

## 2019-07-12 DIAGNOSIS — K922 Gastrointestinal hemorrhage, unspecified: Secondary | ICD-10-CM

## 2019-07-12 DIAGNOSIS — I2510 Atherosclerotic heart disease of native coronary artery without angina pectoris: Secondary | ICD-10-CM | POA: Diagnosis not present

## 2019-07-12 DIAGNOSIS — R072 Precordial pain: Secondary | ICD-10-CM | POA: Diagnosis not present

## 2019-07-12 DIAGNOSIS — I2699 Other pulmonary embolism without acute cor pulmonale: Secondary | ICD-10-CM | POA: Diagnosis present

## 2019-07-12 DIAGNOSIS — I272 Pulmonary hypertension, unspecified: Secondary | ICD-10-CM | POA: Diagnosis present

## 2019-07-12 DIAGNOSIS — Z888 Allergy status to other drugs, medicaments and biological substances status: Secondary | ICD-10-CM | POA: Diagnosis not present

## 2019-07-12 DIAGNOSIS — I2692 Saddle embolus of pulmonary artery without acute cor pulmonale: Secondary | ICD-10-CM | POA: Diagnosis not present

## 2019-07-12 DIAGNOSIS — J45909 Unspecified asthma, uncomplicated: Secondary | ICD-10-CM | POA: Diagnosis present

## 2019-07-12 DIAGNOSIS — Z8719 Personal history of other diseases of the digestive system: Secondary | ICD-10-CM | POA: Diagnosis not present

## 2019-07-12 DIAGNOSIS — M1990 Unspecified osteoarthritis, unspecified site: Secondary | ICD-10-CM | POA: Diagnosis not present

## 2019-07-12 DIAGNOSIS — D649 Anemia, unspecified: Secondary | ICD-10-CM | POA: Diagnosis present

## 2019-07-12 DIAGNOSIS — I10 Essential (primary) hypertension: Secondary | ICD-10-CM | POA: Diagnosis present

## 2019-07-12 DIAGNOSIS — I248 Other forms of acute ischemic heart disease: Secondary | ICD-10-CM | POA: Diagnosis not present

## 2019-07-12 DIAGNOSIS — J4520 Mild intermittent asthma, uncomplicated: Secondary | ICD-10-CM | POA: Diagnosis not present

## 2019-07-12 DIAGNOSIS — R778 Other specified abnormalities of plasma proteins: Secondary | ICD-10-CM | POA: Diagnosis present

## 2019-07-12 DIAGNOSIS — Z7982 Long term (current) use of aspirin: Secondary | ICD-10-CM | POA: Diagnosis not present

## 2019-07-12 DIAGNOSIS — E8770 Fluid overload, unspecified: Secondary | ICD-10-CM | POA: Diagnosis present

## 2019-07-12 DIAGNOSIS — R7989 Other specified abnormal findings of blood chemistry: Secondary | ICD-10-CM | POA: Diagnosis present

## 2019-07-12 DIAGNOSIS — Z87891 Personal history of nicotine dependence: Secondary | ICD-10-CM

## 2019-07-12 DIAGNOSIS — Z6837 Body mass index (BMI) 37.0-37.9, adult: Secondary | ICD-10-CM

## 2019-07-12 DIAGNOSIS — E669 Obesity, unspecified: Secondary | ICD-10-CM | POA: Diagnosis present

## 2019-07-12 DIAGNOSIS — I361 Nonrheumatic tricuspid (valve) insufficiency: Secondary | ICD-10-CM | POA: Diagnosis not present

## 2019-07-12 DIAGNOSIS — R0789 Other chest pain: Secondary | ICD-10-CM | POA: Diagnosis not present

## 2019-07-12 DIAGNOSIS — K224 Dyskinesia of esophagus: Secondary | ICD-10-CM | POA: Diagnosis not present

## 2019-07-12 DIAGNOSIS — I252 Old myocardial infarction: Secondary | ICD-10-CM | POA: Diagnosis not present

## 2019-07-12 HISTORY — DX: Acute embolism and thrombosis of unspecified deep veins of unspecified lower extremity: I82.409

## 2019-07-12 LAB — CBC WITH DIFFERENTIAL/PLATELET
Abs Immature Granulocytes: 0.05 10*3/uL (ref 0.00–0.07)
Basophils Absolute: 0 10*3/uL (ref 0.0–0.1)
Basophils Relative: 0 %
Eosinophils Absolute: 0.1 10*3/uL (ref 0.0–0.5)
Eosinophils Relative: 0 %
HCT: 35.5 % — ABNORMAL LOW (ref 36.0–46.0)
Hemoglobin: 11.6 g/dL — ABNORMAL LOW (ref 12.0–15.0)
Immature Granulocytes: 0 %
Lymphocytes Relative: 8 %
Lymphs Abs: 1 10*3/uL (ref 0.7–4.0)
MCH: 32.1 pg (ref 26.0–34.0)
MCHC: 32.7 g/dL (ref 30.0–36.0)
MCV: 98.3 fL (ref 80.0–100.0)
Monocytes Absolute: 0.8 10*3/uL (ref 0.1–1.0)
Monocytes Relative: 6 %
Neutro Abs: 10.7 10*3/uL — ABNORMAL HIGH (ref 1.7–7.7)
Neutrophils Relative %: 86 %
Platelets: 168 10*3/uL (ref 150–400)
RBC: 3.61 MIL/uL — ABNORMAL LOW (ref 3.87–5.11)
RDW: 12.7 % (ref 11.5–15.5)
WBC: 12.6 10*3/uL — ABNORMAL HIGH (ref 4.0–10.5)
nRBC: 0 % (ref 0.0–0.2)

## 2019-07-12 LAB — COMPREHENSIVE METABOLIC PANEL
ALT: 20 U/L (ref 0–44)
AST: 26 U/L (ref 15–41)
Albumin: 3.5 g/dL (ref 3.5–5.0)
Alkaline Phosphatase: 73 U/L (ref 38–126)
Anion gap: 9 (ref 5–15)
BUN: 20 mg/dL (ref 8–23)
CO2: 22 mmol/L (ref 22–32)
Calcium: 9.8 mg/dL (ref 8.9–10.3)
Chloride: 109 mmol/L (ref 98–111)
Creatinine, Ser: 1.01 mg/dL — ABNORMAL HIGH (ref 0.44–1.00)
GFR calc Af Amer: >60 mL/min (ref >60)
GFR calc non Af Amer: 54 mL/min — ABNORMAL LOW (ref >60)
Glucose, Bld: 135 mg/dL — ABNORMAL HIGH (ref 70–99)
Potassium: 3.6 mmol/L (ref 3.5–5.1)
Sodium: 140 mmol/L (ref 135–145)
Total Bilirubin: 0.5 mg/dL (ref 0.3–1.2)
Total Protein: 6.3 g/dL — ABNORMAL LOW (ref 6.5–8.1)

## 2019-07-12 LAB — TROPONIN I (HIGH SENSITIVITY)
Troponin I (High Sensitivity): 267 ng/L (ref <18)
Troponin I (High Sensitivity): 965 ng/L (ref <18)

## 2019-07-12 LAB — BRAIN NATRIURETIC PEPTIDE: B Natriuretic Peptide: 27.2 pg/mL (ref 0.0–100.0)

## 2019-07-12 LAB — D-DIMER, QUANTITATIVE: D-Dimer, Quant: 19.65 ug/mL-FEU — ABNORMAL HIGH (ref 0.00–0.50)

## 2019-07-12 MED ORDER — ACETAMINOPHEN 650 MG RE SUPP: 650.0000 mg | Freq: Four times a day (QID) | RECTAL | Status: DC | PRN

## 2019-07-12 MED ORDER — HEPARIN BOLUS VIA INFUSION
4000.0000 [IU] | Freq: Once | INTRAVENOUS | Status: AC
  Administered 2019-07-12: 4000 [IU] via INTRAVENOUS
  Filled 2019-07-12: qty 4000

## 2019-07-12 MED ORDER — ASPIRIN EC 325 MG PO TBEC: 325.0000 mg | DELAYED_RELEASE_TABLET | Freq: Every day | ORAL | Status: DC

## 2019-07-12 MED ORDER — ACETAMINOPHEN 325 MG PO TABS: 650.0000 mg | ORAL_TABLET | Freq: Four times a day (QID) | ORAL | Status: DC | PRN

## 2019-07-12 MED ORDER — ATORVASTATIN CALCIUM 80 MG PO TABS: 80.0000 mg | ORAL_TABLET | Freq: Every day | ORAL | Status: DC

## 2019-07-12 MED ORDER — IOHEXOL 350 MG/ML SOLN
75.0000 mL | Freq: Once | INTRAVENOUS | Status: AC | PRN
  Administered 2019-07-12: 75 mL via INTRAVENOUS

## 2019-07-12 MED ORDER — SODIUM CHLORIDE 0.9 % IV SOLN
INTRAVENOUS | Status: AC
  Administered 2019-07-13: 01:00:00 via INTRAVENOUS

## 2019-07-12 MED ORDER — ONDANSETRON HCL 4 MG/2ML IJ SOLN
4.0000 mg | Freq: Once | INTRAMUSCULAR | Status: AC
  Administered 2019-07-12: 17:00:00 4 mg via INTRAVENOUS
  Filled 2019-07-12: qty 2

## 2019-07-12 MED ORDER — SODIUM CHLORIDE 0.9 % IV BOLUS
500.0000 mL | Freq: Once | INTRAVENOUS | Status: AC
  Administered 2019-07-12: 17:00:00 500 mL via INTRAVENOUS

## 2019-07-12 MED ORDER — HEPARIN (PORCINE) 25000 UT/250ML-% IV SOLN
1000.0000 [IU]/h | INTRAVENOUS | Status: DC
  Administered 2019-07-12: 22:00:00 1200 [IU]/h via INTRAVENOUS
  Administered 2019-07-13: 1000 [IU]/h via INTRAVENOUS
  Filled 2019-07-12 (×2): qty 250

## 2019-07-12 MED ORDER — PANTOPRAZOLE SODIUM 40 MG IV SOLR
40.0000 mg | INTRAVENOUS | Status: DC
  Administered 2019-07-13 – 2019-07-14 (×3): 40 mg via INTRAVENOUS
  Filled 2019-07-12 (×3): qty 40

## 2019-07-12 NOTE — Progress Notes (Signed)
ANTICOAGULATION CONSULT NOTE - Initial Consult  Pharmacy Consult:  Heparin Indication: chest pain/ACS  Allergies  Allergen Reactions  . Tuberculin Tests Hives  . Other Other (See Comments)    Jehovah's Witness- NO BLOOD PRODUCTS!!    Patient Measurements: Height: 5\' 6"  (167.6 cm) Weight: 235 lb (106.6 kg) IBW/kg (Calculated) : 59.3 Heparin Dosing Weight: 88 kg  Vital Signs: Temp: 98.4 F (36.9 C) (11/01 1609) Temp Source: Oral (11/01 1609) BP: 119/78 (11/01 2032) Pulse Rate: 90 (11/01 2032)  Labs: Recent Labs    07/12/19 1721 07/12/19 2027  HGB 11.6*  --   HCT 35.5*  --   PLT 168  --   CREATININE 1.01*  --   TROPONINIHS 267* 965*    Estimated Creatinine Clearance: 58.5 mL/min (A) (by C-G formula based on SCr of 1.01 mg/dL (H)).   Medical History: Past Medical History:  Diagnosis Date  . Arthritis   . Asthma   . Complication of anesthesia    difficulty waking up  . Coronary artery disease   . DVT (deep venous thrombosis) ([place])   . Refusal of blood transfusions as patient is Jehovah's Witness   . Thyroid nodule      Assessment: 11 YOF presented with chest discomfort and Pharmacy consulted to dose IV heparin for ACS.  Patient's d-dimer is also elevated and CTA ordered.  Baseline labs and home meds reviewed.  Goal of Therapy:  Heparin level 0.3-0.7 units/ml Monitor platelets by anticoagulation protocol: Yes   Plan:  Heparin 4000 units IV bolus, then Heparin gtt at 1200 units/hr Check 8 hr heparin level Daily heparin level and CBC F/U CTA  Raechal Raben D. Mina Marble, PharmD, BCPS, [place] 07/12/2019, 9:59 PM

## 2019-07-12 NOTE — ED Provider Notes (Signed)
[facility] EMERGENCY DEPARTMENT Provider Note   CSN: [id number] Arrival date & time: 07/12/19  1559     History   Chief Complaint No chief complaint on file.   HPI Karen Owens is a 78 y.o. female.     Patient states that she had some chest discomfort today and took 3 nitros.  The pain has gone away now.  Patient has a history of coronary artery spasm  The history is provided by the patient. No language interpreter was used.  Chest Pain Pain location:  Substernal area and L chest Pain quality: aching   Pain radiates to:  Does not radiate Pain severity:  Moderate Onset quality:  Sudden Timing:  Constant Progression:  Worsening Chronicity:  New Context: not breathing   Associated symptoms: no abdominal pain, no back pain, no cough, no fatigue and no headache     Past Medical History:  Diagnosis Date  . Arthritis   . Asthma   . Complication of anesthesia    difficulty waking up  . Coronary artery disease   . Refusal of blood transfusions as patient is Jehovah's Witness   . Thyroid nodule     Patient Active Problem List   Diagnosis Date Noted  . Prinzmetal variant angina ([place]) 04/27/2019  . Chest pain 04/26/2019  . DVT (deep venous thrombosis) ([place]) 06/10/2018  . Pulmonary embolism ([place]) 06/10/2018  . HLD (hyperlipidemia) 06/10/2018  . NSTEMI (non-ST elevated myocardial infarction) ([place]) 06/06/2018  . FIBROIDS, UTERUS 12/11/2007  . ELEVATED BLOOD PRESSURE WITHOUT DIAGNOSIS OF HYPERTENSION 12/11/2007  . DENTAL CARIES 10/28/2007  . CALCANEAL SPUR, RIGHT 10/28/2007  . ASTHMA 10/13/2007  . GASTROENTERITIS, ACUTE 08/11/2007  . ANXIETY 07/02/2007  . ARTHRITIS, RIGHT SHOULDER 07/02/2007  . PAP SMEAR, LGSIL, ABNORMAL 05/27/2007  . COLPOSCOPY WITH BIOPSY, HX OF 05/27/2007    Past Surgical History:  Procedure Laterality Date  . DILATION AND CURETTAGE OF UTERUS    . LEFT HEART CATH AND CORONARY ANGIOGRAPHY N/A 06/09/2018   Procedure: LEFT HEART  CATH AND CORONARY ANGIOGRAPHY;  Surgeon: Lorretta Harp, MD;  Location: [facility];  Service: Cardiovascular;  Laterality: N/A;  . LIPOMA EXCISION     right arm  . TOTAL KNEE ARTHROPLASTY     left  . TOTAL KNEE ARTHROPLASTY  08/20/2012   right knee  . TOTAL KNEE ARTHROPLASTY  08/20/2012   Procedure: TOTAL KNEE ARTHROPLASTY;  Surgeon: Ninetta Lights, MD;  Location: [facility];  Service: Orthopedics;  Laterality: Right;     OB History   No obstetric history on file.      Home Medications    Prior to Admission medications   Medication Sig Start Date End Date Taking? Authorizing Provider  APPLE CIDER VINEGAR PO Take 250 mg by mouth daily.    [provider]  aspirin 81 MG chewable tablet Chew 324 mg by mouth as needed (for chest pain).    [provider]  carboxymethylcellulose (REFRESH TEARS) 0.5 % SOLN Place 1 drop into both eyes 3 (three) times daily as needed (for dryness).    [provider]  ibuprofen (ADVIL,MOTRIN) 800 MG tablet Take 800 mg by mouth every 8 (eight) hours as needed (inflammation/pain).  05/05/18   [provider]  nitroGLYCERIN (NITROSTAT) 0.4 MG SL tablet Place 1 tablet (0.4 mg total) under the tongue every 5 (five) minutes x 3 doses as needed. Patient taking differently: Place 0.4 mg under the tongue every 5 (five) minutes x 3  doses as needed for chest pain.  07/09/18   Lorretta Harp, MD  Nutritional Supplements (IMMUNOCAL PO) Take 1 packet by mouth daily. Mix in juice and drink    [provider]  Omega 3-6-9 Fatty Acids (OMEGA-3-6-9 PO) Take 480 mg by mouth daily.    [provider]    Family History No family history on file.  Social History Social History   Tobacco Use  . Smoking status: Former Smoker    Quit date: 09/10/1982    Years since quitting: 36.8  . Smokeless tobacco: Never Used  Substance Use Topics  . Alcohol use: Yes    Comment: occasional  . Drug use: No     Allergies    Tuberculin tests and Other   Review of Systems Review of Systems  Constitutional: Negative for appetite change and fatigue.  HENT: Negative for congestion, ear discharge and sinus pressure.   Eyes: Negative for discharge.  Respiratory: Negative for cough.   Cardiovascular: Positive for chest pain.  Gastrointestinal: Negative for abdominal pain and diarrhea.  Genitourinary: Negative for frequency and hematuria.  Musculoskeletal: Negative for back pain.  Skin: Negative for rash.  Neurological: Negative for seizures and headaches.  Psychiatric/Behavioral: Negative for hallucinations.     Physical Exam Updated Vital Signs BP 100/79   Pulse 97   Temp 98.4 F (36.9 C) (Oral)   Resp 18   Ht 5\' 6"  (1.676 m)   Wt 106.6 kg   SpO2 99%   BMI 37.93 kg/m   Physical Exam Vitals signs and nursing note reviewed.  Constitutional:      Appearance: She is well-developed.  HENT:     Head: Normocephalic.     Nose: Nose normal.  Eyes:     General: No scleral icterus.    Conjunctiva/sclera: Conjunctivae normal.  Neck:     Musculoskeletal: Neck supple.     Thyroid: No thyromegaly.  Cardiovascular:     Rate and Rhythm: Normal rate and regular rhythm.     Heart sounds: No murmur. No friction rub. No gallop.   Pulmonary:     Breath sounds: No stridor. No wheezing or rales.  Chest:     Chest wall: No tenderness.  Abdominal:     General: There is no distension.     Tenderness: There is no abdominal tenderness. There is no rebound.  Musculoskeletal: Normal range of motion.  Lymphadenopathy:     Cervical: No cervical adenopathy.  Skin:    Findings: No erythema or rash.  Neurological:     Mental Status: She is oriented to person, place, and time.     Motor: No abnormal muscle tone.     Coordination: Coordination normal.  Psychiatric:        Behavior: Behavior normal.      ED Treatments / Results  Labs (all labs ordered are listed, but only abnormal results are displayed) Labs  Reviewed  CBC WITH DIFFERENTIAL/PLATELET - Abnormal; Notable for the following components:      Result Value   WBC 12.6 (*)    RBC 3.61 (*)    Hemoglobin 11.6 (*)    HCT 35.5 (*)    Neutro Abs 10.7 (*)    All other components within normal limits  COMPREHENSIVE METABOLIC PANEL - Abnormal; Notable for the following components:   Glucose, Bld 135 (*)    Creatinine, Ser 1.01 (*)    Total Protein 6.3 (*)    GFR calc non Af Amer 54 (*)  All other components within normal limits  D-DIMER, QUANTITATIVE (NOT AT [facility]) - Abnormal; Notable for the following components:   D-Dimer, Quant 19.65 (*)    All other components within normal limits  TROPONIN I (HIGH SENSITIVITY) - Abnormal; Notable for the following components:   Troponin I (High Sensitivity) 267 (*)    All other components within normal limits  BRAIN NATRIURETIC PEPTIDE  TROPONIN I (HIGH SENSITIVITY)    EKG EKG Interpretation  Date/Time:  Sunday July 12 2019 16:13:54 EST Ventricular Rate:  97 PR Interval:    QRS Duration: 107 QT Interval:  351 QTC Calculation: 446 R Axis:   -33 Text Interpretation: Sinus rhythm Atrial premature complex Consider left atrial enlargement RSR' in V1 or V2, probably normal variant Left ventricular hypertrophy Inferior infarct, old Confirmed by Milton Ferguson [id number]) on 07/12/2019 7:19:21 PM   Radiology Dg Chest Portable 1 View  Result Date: 07/12/2019 CLINICAL DATA:  Pt c/o central CP, SOB, nausea, and weakness. Pt hx CAD, asthma, arthritis. Pt is a former smoker, quit in the 80's. EXAM: PORTABLE CHEST 1 VIEW COMPARISON:  04/26/2019 FINDINGS: Borderline enlargement of the cardiopericardial silhouette. This is stable. No mediastinal or hilar masses. Clear lungs.  No pleural effusion or pneumothorax. Skeletal structures are grossly intact. IMPRESSION: No active disease. Electronically Signed   By: Lajean Manes M.D.   On: 07/12/2019 16:45    Procedures Procedures (including critical care  time)  Medications Ordered in ED Medications  ondansetron (ZOFRAN) injection 4 mg (4 mg Intravenous Given 07/12/19 1630)  sodium chloride 0.9 % bolus 500 mL (500 mLs Intravenous New Bag/Given 07/12/19 1633)     Initial Impression / Assessment and Plan / ED Course  I have reviewed the triage vital signs and the nursing notes.  Pertinent labs & imaging results that were available during my care of the patient were reviewed by me and considered in my medical decision making (see chart for details).    Patient with chest pain and elevated troponin.  Patient has a history of coronary artery spasm.  She had clean coronaries a year ago.  Patient also has had negative CT angio and V/Q scans before.  She will be admitted to medicine for chest pain but she is going to get a CT angio also  Final Clinical Impressions(s) / ED Diagnoses   Final diagnoses:  None    ED Discharge Orders    None       Milton Ferguson, MD 07/12/19 2042

## 2019-07-12 NOTE — Consult Note (Signed)
NAME:  Karen Owens, MRN:  [id number], DOB:  07-07-1941, LOS: 0 ADMISSION DATE:  07/12/2019, CONSULTATION DATE:  07/12/2019 REFERRING MD:  Jani Gravel  CHIEF COMPLAINT:  Chest pain/shortness of breath   Brief History   78yo female admitted for large pulmonary embolus  History of present illness   78yo female w/ a past medical history significant for hypertension, hyperlipidemia, asthma and DVT x1 year ago, not currently on anticoagulation, that presented to the ED this evening with complaints of shortness of breath and chest pain without relief from Nitro x3 doses. ED workup included a CTA of chest which revealed a large saddle pulmonary embolus. Critical care was therefore consulted for evaluation.   Upon examination in the ED, the patient is alert and oriented x3, on 2L [place], normotensive, normal sinus rhythm not requiring any vasopressor therapy. She currently denies any chest pain, shortness of breath or other problems. Daughter at bedside.   The patient reports approximately 1 year ago she was diagnosed w/a DVT and was placed on Eliquis but approx 2-3 weeks after taking it developed a GI bleed and therefore discontinued therapy. She did not undergo an endoscopy procedure at that time.   Chart reviewed. Significant lab values include: Troponin 267-->965, D-dimer 19.65 otherwise unremarkable.  Of note patient is a Jehovah witness, no blood products   Past Medical History  Past medical history: Arthritis Asthma CAD Hypertension Hyperlipidemia Prior DVT x1 year ago, not anticoagulated due to GI bleed   Significant Hospital Events   New Consult 11/1  Consults:  TRH primary  Procedures:  None  Significant Diagnostic Tests:  CTA-chest: Large saddle PE at bifurcation of the left PA extending into left upper and left lower lobes. Large embolus identified at bifurcation of right PA extending into upper lobe descending interlobar pulmonary artery and right lower lobe. RV/LV ratio 1.43   Micro Data:  N/A  Antimicrobials:  N/A  Interim history/subjective:    Objective   Blood pressure 119/78, pulse 90, temperature 98.4 F (36.9 C), temperature source Oral, resp. rate 12, height 5\' 6"  (1.676 m), weight 106.6 kg, SpO2 100 %.        Intake/Output Summary (Last 24 hours) at 07/12/2019 2307 Last data filed at 07/12/2019 2029 Gross per 24 hour  Intake 500 ml  Output -  Net 500 ml   Filed Weights   07/12/19 1605  Weight: 106.6 kg    Examination: General: Alert and in no acute distress HENT: Normocephalic, moist oral mucosa Lungs: Lungs clear throughout, breath sounds equal Cardiovascular: *normal sinus rhythm, Heart sounds S1,S2, no lower extremity edema Abdomen: soft, round, non-tender, normal bowel sounds Extremities: no deformities, no edema Neuro: Alert and oriented x3, no focal deficits  Resolved Hospital Problem list     Assessment & Plan:  78yo female admitted 11/1 w/ a large saddle PE currently normotensive, on 2L [place] and in no acute distress.  Plan:  Large saddle PE --Continue heparin gtt --ECHO pending --Lower extremity ultrasound to rule out DVT --RV/LV ratio 1.43 but on exam does not appear to have right heart strain  --Monitor vital signs closely --Okay to go to a progressive bed  Hx of GI Bleed --Per patient, after taking eliquis for 2-3 weeks developed GI bleed --Treatment stopped but did not have further diagnostics done --Place on protonix --Monitor closely  Hx of Hypertension Hx of Hyperlipidemia --Hold anti-hypertensives for now  --Continue statin   Best practice:  Diet: Regular  Pain/Anxiety/Delirium protocol (if indicated): N/A VAP  protocol (if indicated): N/A DVT prophylaxis: On systemic heparin GI prophylaxis: Protonix Glucose control: N/A Mobility: As tolerated Code Status: Full  Family Communication: Daughter at bedside and updated Disposition: To progressive care bed  Labs   CBC: Recent Labs  Lab 07/12/19  1721  WBC 12.6*  NEUTROABS 10.7*  HGB 11.6*  HCT 35.5*  MCV 98.3  PLT [id number]    Basic Metabolic Panel: Recent Labs  Lab 07/12/19 1721  NA 140  K 3.6  CL 109  CO2 22  GLUCOSE 135*  BUN 20  CREATININE 1.01*  CALCIUM 9.8   GFR: Estimated Creatinine Clearance: 58.5 mL/min (A) (by C-G formula based on SCr of 1.01 mg/dL (H)). Recent Labs  Lab 07/12/19 1721  WBC 12.6*    Liver Function Tests: Recent Labs  Lab 07/12/19 1721  AST 26  ALT 20  ALKPHOS 73  BILITOT 0.5  PROT 6.3*  ALBUMIN 3.5   No results for input(s): LIPASE, AMYLASE in the last 168 hours. No results for input(s): AMMONIA in the last 168 hours.  ABG    Component Value Date/Time   TCO2 24 03/17/2009 1354     Coagulation Profile: No results for input(s): INR, PROTIME in the last 168 hours.  Cardiac Enzymes: No results for input(s): CKTOTAL, CKMB, CKMBINDEX, TROPONINI in the last 168 hours.  HbA1C: No results found for: HGBA1C  CBG: No results for input(s): GLUCAP in the last 168 hours.  Review of Systems:   Denies chest pain, shortness of breath, nausea/vomiting  Past Medical History  She,  has a past medical history of Arthritis, Asthma, Complication of anesthesia, DVT (deep venous thrombosis) ([place]), Refusal of blood transfusions as patient is Jehovah's Witness, and Thyroid nodule.   Surgical History    Past Surgical History:  Procedure Laterality Date  . DILATION AND CURETTAGE OF UTERUS    . LEFT HEART CATH AND CORONARY ANGIOGRAPHY N/A 06/09/2018   Procedure: LEFT HEART CATH AND CORONARY ANGIOGRAPHY;  Surgeon: Lorretta Harp, MD;  Location: [facility];  Service: Cardiovascular;  Laterality: N/A;  . LIPOMA EXCISION     right arm  . TOTAL KNEE ARTHROPLASTY     left  . TOTAL KNEE ARTHROPLASTY  08/20/2012   right knee  . TOTAL KNEE ARTHROPLASTY  08/20/2012   Procedure: TOTAL KNEE ARTHROPLASTY;  Surgeon: Ninetta Lights, MD;  Location: [facility];  Service: Orthopedics;  Laterality:  Right;     Social History   reports that she quit smoking about 36 years ago. She has never used smokeless tobacco. She reports current alcohol use. She reports that she does not use drugs.   Family History   Her family history includes Heart attack in her brother. Denies history of clots  Allergies Allergies  Allergen Reactions  . Tuberculin Tests Hives  . Other Other (See Comments)    Jehovah's Witness- NO BLOOD PRODUCTS!!     Home Medications  Prior to Admission medications   Medication Sig Start Date End Date Taking? Authorizing Provider  APPLE CIDER VINEGAR PO Take 250 mg by mouth daily.   Yes [provider]  carboxymethylcellulose (REFRESH TEARS) 0.5 % SOLN Place 1 drop into both eyes 2 (two) times daily.    Yes [provider]  ibuprofen (ADVIL,MOTRIN) 800 MG tablet Take 800 mg by mouth every 8 (eight) hours as needed (inflammation/pain).  05/05/18  Yes [provider]  metoprolol tartrate (LOPRESSOR) 25 MG tablet Take 12.5 mg by mouth 2 (two) times daily.  07/02/19  Yes [provider]  nitroGLYCERIN (NITROSTAT) 0.4 MG SL tablet Place 1 tablet (0.4 mg total) under the tongue every 5 (five) minutes x 3 doses as needed. Patient taking differently: Place 0.4 mg under the tongue every 5 (five) minutes x 3 doses as needed for chest pain.  07/09/18  Yes Lorretta Harp, MD  Nutritional Supplements (IMMUNOCAL PO) Take 1 packet by mouth daily. Mix in juice and drink   Yes [provider]     Critical care time: 45 minutes

## 2019-07-12 NOTE — ED Triage Notes (Signed)
Friend called [facility] , cp substerenal. 3 nitro taken by pt. Nausea and weak . When EMS arrived, 96/54, asa, 4 zofran . enroute  Pts BP was  100/74. eg obtained (normal

## 2019-07-12 NOTE — H&P (Addendum)
TRH H&P    Patient Demographics:    Karen Owens, is a 78 y.o. female  MRN: [id number]  DOB - 03-18-1941  Admit Date - 07/12/2019  Referring MD/NP/PA:  [place]  Outpatient Primary MD for the patient is Harlan Stains, MD Quay Burow  Patient coming from:  home  Chief complaint-   Chest pain    HPI:    Karen Owens  is a 78 y.o. female, w h/o asthma, multinodular goiter, h/o DVT, chest pain (prinzmetals angina) w prior catheterization 06/09/2009 -> negative for CAD, ,  C/o chest pain just started today.  This morning around 8am she had sob.  At rest.  Then at 2pm  Pressure substernal w radiation to the back.  No radation to the arm.  Felt weak. Nauseated.  Started taking nitro, x3 without relief.    Denies fever, chills, cough, palp, emesis, abd pain, diarrhea, brbpr.    Pt notes she was formerly on hydrochlorothiazide but switched to metoprolol recently by cardiology   In ED,  T 98.4, P 104, R 16, Bp 105/57  Pox 88% on RA Wt 106.6 kg  Wbc 12.6, Hgb 11.6, Plt 168 Na 140 IK 3.6,  Bun 20, Creat 1.01 Ast 26, Alt 20  D dimer 19.65 Trop 267 -> 965  BNP 27.2  Ekg nsr at 95, LAD, nl int, borderline LVH, no st-t changes c/w ischemia   Pt will be admitted for evaluation of chest pain     Review of systems:    In addition to the HPI above,  No Fever-chills, No Headache, No changes with Vision or hearing, No problems swallowing food or Liquids, No Cough  No Abdominal pain, No  Vomiting, bowel movements are regular, No Blood in stool or Urine, No dysuria, No new skin rashes or bruises, No new joints pains-aches,  No new weakness, tingling, numbness in any extremity, No recent weight gain or loss, No polyuria, polydypsia or polyphagia, No significant Mental Stressors.  All other systems reviewed and are negative.    Past History of the following :    Past Medical History:  Diagnosis Date   . Arthritis   . Asthma   . Complication of anesthesia    difficulty waking up  . Coronary artery disease   . Refusal of blood transfusions as patient is Jehovah's Witness   . Thyroid nodule       Past Surgical History:  Procedure Laterality Date  . DILATION AND CURETTAGE OF UTERUS    . LEFT HEART CATH AND CORONARY ANGIOGRAPHY N/A 06/09/2018   Procedure: LEFT HEART CATH AND CORONARY ANGIOGRAPHY;  Surgeon: Lorretta Harp, MD;  Location: [facility];  Service: Cardiovascular;  Laterality: N/A;  . LIPOMA EXCISION     right arm  . TOTAL KNEE ARTHROPLASTY     left  . TOTAL KNEE ARTHROPLASTY  08/20/2012   right knee  . TOTAL KNEE ARTHROPLASTY  08/20/2012   Procedure: TOTAL KNEE ARTHROPLASTY;  Surgeon: Ninetta Lights, MD;  Location: [facility];  Service: Orthopedics;  Laterality: Right;      Social History:      Social History   Tobacco Use  . Smoking status: Former Smoker    Quit date: 09/10/1982    Years since quitting: 36.8  . Smokeless tobacco: Never Used  Substance Use Topics  . Alcohol use: Yes    Comment: occasional       Family History :    History reviewed. No pertinent family history.    Home Medications:   Prior to Admission medications   Medication Sig Start Date End Date Taking? Authorizing Provider  APPLE CIDER VINEGAR PO Take 250 mg by mouth daily.    [provider]  aspirin 81 MG chewable tablet Chew 324 mg by mouth as needed (for chest pain).    [provider]  carboxymethylcellulose (REFRESH TEARS) 0.5 % SOLN Place 1 drop into both eyes 3 (three) times daily as needed (for dryness).    [provider]  ibuprofen (ADVIL,MOTRIN) 800 MG tablet Take 800 mg by mouth every 8 (eight) hours as needed (inflammation/pain).  05/05/18   [provider]  nitroGLYCERIN (NITROSTAT) 0.4 MG SL tablet Place 1 tablet (0.4 mg total) under the tongue every 5 (five) minutes x 3 doses as needed. Patient taking differently: Place 0.4  mg under the tongue every 5 (five) minutes x 3 doses as needed for chest pain.  07/09/18   Lorretta Harp, MD  Nutritional Supplements (IMMUNOCAL PO) Take 1 packet by mouth daily. Mix in juice and drink    [provider]  Omega 3-6-9 Fatty Acids (OMEGA-3-6-9 PO) Take 480 mg by mouth daily.    [provider]     Allergies:     Allergies  Allergen Reactions  . Tuberculin Tests Hives  . Other Other (See Comments)    Jehovah's Witness- NO BLOOD PRODUCTS!!     Physical Exam:   Vitals  Blood pressure 119/78, pulse 90, temperature 98.4 F (36.9 C), temperature source Oral, resp. rate 12, height 5\' 6"  (1.676 m), weight 106.6 kg, SpO2 100 %.  1.  General: axoxo3  2. Psychiatric: euthymic  3. Neurologic: cn2-12 intact, reflexes 2+ symmetric,, diffuse with no cl onus, motor 5/5 in all 4 ext  4. HEENMT:  Anicteric, pupils 1.37mm symmetric, direct, consensual, near intact Neck: no jvd  5. Respiratory : CTAB  6. Cardiovascular : rrr s1, s2, no m/g/r,  No chest pain with palpation  7. Gastrointestinal:  Abd: soft, obese, nt, nd, +bs  8. Skin:  Ext: no c/c/e,  No rash No sign of marfans   9.Musculoskeletal:  Good ROM    Data Review:    CBC Recent Labs  Lab 07/12/19 1721  WBC 12.6*  HGB 11.6*  HCT 35.5*  PLT 168  MCV 98.3  MCH 32.1  MCHC 32.7  RDW 12.7  LYMPHSABS 1.0  MONOABS 0.8  EOSABS 0.1  BASOSABS 0.0   ------------------------------------------------------------------------------------------------------------------  Results for orders placed or performed during the hospital encounter of 07/12/19 (from the past 48 hour(s))  CBC with Differential/Platelet     Status: Abnormal   Collection Time: 07/12/19  5:21 PM  Result Value Ref Range   WBC 12.6 (H) 4.0 - 10.5 K/uL   RBC 3.61 (L) 3.87 - 5.11 MIL/uL   Hemoglobin 11.6 (L) 12.0 - 15.0 g/dL   HCT 35.5 (L) 36.0 - 46.0 %   MCV 98.3 80.0 - 100.0 fL   MCH 32.1 26.0 - 34.0 pg   MCHC  32.7  30.0 - 36.0 g/dL   RDW 12.7 11.5 - 15.5 %   Platelets 168 150 - 400 K/uL   nRBC 0.0 0.0 - 0.2 %   Neutrophils Relative % 86 %   Neutro Abs 10.7 (H) 1.7 - 7.7 K/uL   Lymphocytes Relative 8 %   Lymphs Abs 1.0 0.7 - 4.0 K/uL   Monocytes Relative 6 %   Monocytes Absolute 0.8 0.1 - 1.0 K/uL   Eosinophils Relative 0 %   Eosinophils Absolute 0.1 0.0 - 0.5 K/uL   Basophils Relative 0 %   Basophils Absolute 0.0 0.0 - 0.1 K/uL   Immature Granulocytes 0 %   Abs Immature Granulocytes 0.05 0.00 - 0.07 K/uL    Comment: Performed at [facility] [address], [place], [place] [address]  Comprehensive metabolic panel     Status: Abnormal   Collection Time: 07/12/19  5:21 PM  Result Value Ref Range   Sodium 140 135 - 145 mmol/L   Potassium 3.6 3.5 - 5.1 mmol/L   Chloride 109 98 - 111 mmol/L   CO2 22 22 - 32 mmol/L   Glucose, Bld 135 (H) 70 - 99 mg/dL   BUN 20 8 - 23 mg/dL   Creatinine, Ser 1.01 (H) 0.44 - 1.00 mg/dL   Calcium 9.8 8.9 - 10.3 mg/dL   Total Protein 6.3 (L) 6.5 - 8.1 g/dL   Albumin 3.5 3.5 - 5.0 g/dL   AST 26 15 - 41 U/L   ALT 20 0 - 44 U/L   Alkaline Phosphatase 73 38 - 126 U/L   Total Bilirubin 0.5 0.3 - 1.2 mg/dL   GFR calc non Af Amer 54 (L) >60 mL/min   GFR calc Af Amer >60 >60 mL/min   Anion gap 9 5 - 15    Comment: Performed at [facility] [address], [place], [place] [address]  D-dimer, quantitative (not at [facility])     Status: Abnormal   Collection Time: 07/12/19  5:21 PM  Result Value Ref Range   D-Dimer, Quant 19.65 (H) 0.00 - 0.50 ug/mL-FEU    Comment: (NOTE) At the manufacturer cut-off of 0.50 ug/mL FEU, this assay has been documented to exclude PE with a sensitivity and negative predictive value of 97 to 99%.  At this time, this assay has not been approved by the FDA to exclude DVT/VTE. Results should be correlated with clinical presentation. Performed at [facility], [place] [address], [place], [place] [address]   Troponin I  (High Sensitivity)     Status: Abnormal   Collection Time: 07/12/19  5:21 PM  Result Value Ref Range   Troponin I (High Sensitivity) 267 (HH) <18 ng/L    Comment: CRITICAL RESULT CALLED TO, READ BACK BY AND VERIFIED WITH: R HARDY,RN AT 1813 07/12/2019 BY L BENFIELD (NOTE) Elevated high sensitivity troponin I (hsTnI) values and significant  changes across serial measurements may suggest ACS but many other  chronic and acute conditions are known to elevate hsTnI results.  Refer to the Links section for chest pain algorithms and additional  guidance. Performed at [facility], [place] [address], [place], [place] [address]   Brain natriuretic peptide     Status: None   Collection Time: 07/12/19  5:21 PM  Result Value Ref Range   B Natriuretic Peptide 27.2 0.0 - 100.0 pg/mL    Comment: Performed at [facility] [address] [place], [place] [address]  Troponin I (  High Sensitivity)     Status: Abnormal   Collection Time: 07/12/19  8:27 PM  Result Value Ref Range   Troponin I (High Sensitivity) 965 (HH) <18 ng/L    Comment: CRITICAL RESULT CALLED TO, READ BACK BY AND VERIFIED WITH: RN P PEICKERT @2147  07/12/19 BY S GEZAHEGN (NOTE) Elevated high sensitivity troponin I (hsTnI) values and significant  changes across serial measurements may suggest ACS but many other  chronic and acute conditions are known to elevate hsTnI results.  Refer to the Links section for chest pain algorithms and additional  guidance. Performed at [facility], [place] [address] [place], [place] [address]     Chemistries  Recent Labs  Lab 07/12/19 1721  NA 140  K 3.6  CL 109  CO2 22  GLUCOSE 135*  BUN 20  CREATININE 1.01*  CALCIUM 9.8  AST 26  ALT 20  ALKPHOS 73  BILITOT 0.5   ------------------------------------------------------------------------------------------------------------------   ------------------------------------------------------------------------------------------------------------------ GFR: Estimated Creatinine Clearance: 58.5 mL/min (A) (by C-G formula based on SCr of 1.01 mg/dL (H)). Liver Function Tests: Recent Labs  Lab 07/12/19 1721  AST 26  ALT 20  ALKPHOS 73  BILITOT 0.5  PROT 6.3*  ALBUMIN 3.5   No results for input(s): LIPASE, AMYLASE in the last 168 hours. No results for input(s): AMMONIA in the last 168 hours. Coagulation Profile: No results for input(s): INR, PROTIME in the last 168 hours. Cardiac Enzymes: No results for input(s): CKTOTAL, CKMB, CKMBINDEX, TROPONINI in the last 168 hours. BNP (last 3 results) No results for input(s): PROBNP in the last 8760 hours. HbA1C: No results for input(s): HGBA1C in the last 72 hours. CBG: No results for input(s): GLUCAP in the last 168 hours. Lipid Profile: No results for input(s): CHOL, HDL, LDLCALC, TRIG, CHOLHDL, LDLDIRECT in the last 72 hours. Thyroid Function Tests: No results for input(s): TSH, T4TOTAL, FREET4, T3FREE, THYROIDAB in the last 72 hours. Anemia Panel: No results for input(s): VITAMINB12, FOLATE, FERRITIN, TIBC, IRON, RETICCTPCT in the last 72 hours.  --------------------------------------------------------------------------------------------------------------- Urine analysis:    Component Value Date/Time   COLORURINE YELLOW 08/15/2012 1140   APPEARANCEUR HAZY (A) 08/15/2012 1140   LABSPEC 1.029 08/15/2012 1140   PHURINE 5.0 08/15/2012 1140   GLUCOSEU NEGATIVE 08/15/2012 1140   HGBUR NEGATIVE 08/15/2012 1140   BILIRUBINUR NEGATIVE 08/15/2012 1140   KETONESUR NEGATIVE 08/15/2012 1140   PROTEINUR NEGATIVE 08/15/2012 1140   UROBILINOGEN 0.2 08/15/2012 1140   NITRITE NEGATIVE 08/15/2012 1140   LEUKOCYTESUR SMALL (A) 08/15/2012 1140      Imaging Results:    Dg Chest Portable 1 View  Result Date: 07/12/2019 CLINICAL DATA:  Pt c/o central CP, SOB, nausea, and  weakness. Pt hx CAD, asthma, arthritis. Pt is a former smoker, quit in the 80's. EXAM: PORTABLE CHEST 1 VIEW COMPARISON:  04/26/2019 FINDINGS: Borderline enlargement of the cardiopericardial silhouette. This is stable. No mediastinal or hilar masses. Clear lungs.  No pleural effusion or pneumothorax. Skeletal structures are grossly intact. IMPRESSION: No active disease. Electronically Signed   By: Lajean Manes M.D.   On: 07/12/2019 16:45       Assessment & Plan:    Principal Problem:   Chest pain Active Problems:   Anxiety   Asthma  Chest pain Elevated troponin Tele Trop I ' Check hga1c, lipid Check CTA chest r/o PE Check cardiac echo NPO after MN Heparin gtt Aspirin 325mg  po qday Lipitor 80mg  po qhs Cont Metoprolol 12.5mg  po bid  Addendum Acute Pulmonary embolism  PCCM contacted for consult for PE with right heart strain, appreciate input    DVT Prophylaxis-   Heparin gtt  AM Labs Ordered, also please review Full Orders  Family Communication: Admission, patients condition and plan of care including tests being ordered have been discussed with the patient  who indicate understanding and agree with the plan and Code Status.  Code Status:  FULL CODE per patient, daughter present with patient in ED  Admission status: Observation: Based on patients clinical presentation and evaluation of above clinical data, I have made determination that patient meets observation criteria at this time.    Time spent in minutes : 55 minutes   Jani Gravel M.D on 07/12/2019 at 9:53 PM

## 2019-07-12 NOTE — ED Notes (Signed)
IV team at bedside 

## 2019-07-12 NOTE — ED Notes (Signed)
ED TO INPATIENT HANDOFF REPORT  ED Nurse Name and Phone #: Lovell Sheehan [phone]  S Name/Age/Gender Karen Owens 78 y.o. female Room/Bed: 039C/039C  Code Status   Code Status: Full Code  Home/SNF/Other Home Patient oriented to: self, place, time and situation Is this baseline? Yes   Triage Complete: Triage complete  Chief Complaint SOB; CP  Triage Note Friend called Sob , cp substerenal. 3 nitro taken by pt. Nausea and weak . When EMS arrived, 96/54, asa, 4 zofran . enroute  Pts BP was  100/74. eg obtained (normal    Allergies Allergies  Allergen Reactions  . Tuberculin Tests Hives  . Other Other (See Comments)    Jehovah's Witness- NO BLOOD PRODUCTS!!    Level of Care/Admitting Diagnosis ED Disposition    ED Disposition Condition Comment   Admit  Hospital Area: [facility] [[id number]]  Level of Care: Progressive [102]  Admit to Progressive based on following criteria: CARDIOVASCULAR & THORACIC of moderate stability with acute coronary syndrome symptoms/low risk myocardial infarction/hypertensive urgency/arrhythmias/heart failure potentially compromising stability and stable post cardiovascular intervention patients.  Covid Evaluation: Asymptomatic Screening Protocol (No Symptoms)  [facility]: [id number]  Admitting Physician: Jani Gravel [place]  Attending Physician: Jani Gravel [phone]  Estimated length of stay: past midnight tomorrow  Certification:: I certify this patient will need inpatient services for at least 2 midnights  PT Class (Do Not Modify): Inpatient [101]  PT Acc Code (Do Not Modify): Private [1]       B Medical/Surgery History Past Medical History:  Diagnosis Date  . Arthritis   . Asthma   . Complication of anesthesia    difficulty waking up  . DVT (deep venous thrombosis) ([place])   . Refusal of blood transfusions as patient is Jehovah's Witness   . Thyroid nodule    Past Surgical History:  Procedure  Laterality Date  . DILATION AND CURETTAGE OF UTERUS    . LEFT HEART CATH AND CORONARY ANGIOGRAPHY N/A 06/09/2018   Procedure: LEFT HEART CATH AND CORONARY ANGIOGRAPHY;  Surgeon: Lorretta Harp, MD;  Location: [facility];  Service: Cardiovascular;  Laterality: N/A;  . LIPOMA EXCISION     right arm  . TOTAL KNEE ARTHROPLASTY     left  . TOTAL KNEE ARTHROPLASTY  08/20/2012   right knee  . TOTAL KNEE ARTHROPLASTY  08/20/2012   Procedure: TOTAL KNEE ARTHROPLASTY;  Surgeon: Ninetta Lights, MD;  Location: [facility];  Service: Orthopedics;  Laterality: Right;     A IV Location/Drains/Wounds Patient Lines/Drains/Airways Status   Active Line/Drains/Airways    Name:   Placement date:   Placement time:   Site:   Days:   Peripheral IV 07/12/19 Left Hand   07/12/19    1600    Hand   less than 1   Peripheral IV 07/12/19 Right Antecubital   07/12/19    2059    Antecubital   less than 1   Incision 08/20/12 Leg Right   08/20/12    1153     2517          Intake/Output Last 24 hours  Intake/Output Summary (Last 24 hours) at 07/12/2019 2323 Last data filed at 07/12/2019 2029 Gross per 24 hour  Intake 500 ml  Output -  Net 500 ml    Labs/Imaging Results for orders placed or performed during the hospital encounter of 07/12/19 (from the past 48 hour(s))  CBC with Differential/Platelet     Status:  Abnormal   Collection Time: 07/12/19  5:21 PM  Result Value Ref Range   WBC 12.6 (H) 4.0 - 10.5 K/uL   RBC 3.61 (L) 3.87 - 5.11 MIL/uL   Hemoglobin 11.6 (L) 12.0 - 15.0 g/dL   HCT 35.5 (L) 36.0 - 46.0 %   MCV 98.3 80.0 - 100.0 fL   MCH 32.1 26.0 - 34.0 pg   MCHC 32.7 30.0 - 36.0 g/dL   RDW 12.7 11.5 - 15.5 %   Platelets 168 150 - 400 K/uL   nRBC 0.0 0.0 - 0.2 %   Neutrophils Relative % 86 %   Neutro Abs 10.7 (H) 1.7 - 7.7 K/uL   Lymphocytes Relative 8 %   Lymphs Abs 1.0 0.7 - 4.0 K/uL   Monocytes Relative 6 %   Monocytes Absolute 0.8 0.1 - 1.0 K/uL   Eosinophils Relative 0 %    Eosinophils Absolute 0.1 0.0 - 0.5 K/uL   Basophils Relative 0 %   Basophils Absolute 0.0 0.0 - 0.1 K/uL   Immature Granulocytes 0 %   Abs Immature Granulocytes 0.05 0.00 - 0.07 K/uL    Comment: Performed at [facility], [address] [place], [place] [address]  Comprehensive metabolic panel     Status: Abnormal   Collection Time: 07/12/19  5:21 PM  Result Value Ref Range   Sodium 140 135 - 145 mmol/L   Potassium 3.6 3.5 - 5.1 mmol/L   Chloride 109 98 - 111 mmol/L   CO2 22 22 - 32 mmol/L   Glucose, Bld 135 (H) 70 - 99 mg/dL   BUN 20 8 - 23 mg/dL   Creatinine, Ser 1.01 (H) 0.44 - 1.00 mg/dL   Calcium 9.8 8.9 - 10.3 mg/dL   Total Protein 6.3 (L) 6.5 - 8.1 g/dL   Albumin 3.5 3.5 - 5.0 g/dL   AST 26 15 - 41 U/L   ALT 20 0 - 44 U/L   Alkaline Phosphatase 73 38 - 126 U/L   Total Bilirubin 0.5 0.3 - 1.2 mg/dL   GFR calc non Af Amer 54 (L) >60 mL/min   GFR calc Af Amer >60 >60 mL/min   Anion gap 9 5 - 15    Comment: Performed at [facility] [address], [place], [place] [address]  D-dimer, quantitative (not at [facility])     Status: Abnormal   Collection Time: 07/12/19  5:21 PM  Result Value Ref Range   D-Dimer, Quant 19.65 (H) 0.00 - 0.50 ug/mL-FEU    Comment: (NOTE) At the manufacturer cut-off of 0.50 ug/mL FEU, this assay has been documented to exclude PE with a sensitivity and negative predictive value of 97 to 99%.  At this time, this assay has not been approved by the FDA to exclude DVT/VTE. Results should be correlated with clinical presentation. Performed at [facility], [place] [address] [place], [place] [address]   Troponin I (High Sensitivity)     Status: Abnormal   Collection Time: 07/12/19  5:21 PM  Result Value Ref Range   Troponin I (High Sensitivity) 267 (HH) <18 ng/L    Comment: CRITICAL RESULT CALLED TO, READ BACK BY AND VERIFIED WITH: R HARDY,RN AT 1813 07/12/2019 BY L BENFIELD (NOTE) Elevated high sensitivity troponin I (hsTnI) values and  significant  changes across serial measurements may suggest ACS but many other  chronic and acute conditions are known to elevate hsTnI results.  Refer to the Links section for chest pain algorithms  and additional  guidance. Performed at [facility], [place] [address] [place], [place] [address]   Brain natriuretic peptide     Status: None   Collection Time: 07/12/19  5:21 PM  Result Value Ref Range   B Natriuretic Peptide 27.2 0.0 - 100.0 pg/mL    Comment: Performed at [facility] [address], [place], [place] [address]  Troponin I (High Sensitivity)     Status: Abnormal   Collection Time: 07/12/19  8:27 PM  Result Value Ref Range   Troponin I (High Sensitivity) 965 (HH) <18 ng/L    Comment: CRITICAL RESULT CALLED TO, READ BACK BY AND VERIFIED WITH: RN P Danie Diehl @2147  07/12/19 BY S GEZAHEGN (NOTE) Elevated high sensitivity troponin I (hsTnI) values and significant  changes across serial measurements may suggest ACS but many other  chronic and acute conditions are known to elevate hsTnI results.  Refer to the Links section for chest pain algorithms and additional  guidance. Performed at [facility], [place] [address] [place], [place] [address]    Ct Angio Chest Pe W And/or Wo Contrast  Result Date: 07/12/2019 CLINICAL DATA:  Substernal chest pain, nausea, weakness, coronary artery disease, asthma EXAM: CT ANGIOGRAPHY CHEST WITH CONTRAST TECHNIQUE: Multidetector CT imaging of the chest was performed using the standard protocol during bolus administration of intravenous contrast. Multiplanar CT image reconstructions and MIPs were obtained to evaluate the vascular anatomy. CONTRAST:  68mL OMNIPAQUE IOHEXOL 350 MG/ML SOLN IV COMPARISON:  04/27/2019 FINDINGS: Cardiovascular: Aorta normal caliber without aneurysm or dissection. No pericardial effusion. Multiple pulmonary emboli are identified. Large saddle embolus at bifurcation of LEFT pulmonary artery extending into  LEFT upper and LEFT lower lobes. LEFT upper lobe thrombus appears occlusive. Large embolus identified at bifurcation of RIGHT pulmonary artery extending into upper lobe, descending interlobar pulmonary artery and RIGHT lower lobe. Additional segmental embolus in RIGHT middle lobe. Elevated RV/LV ratio of 1.43 indicating RIGHT heart strain and at least submassive pulmonary embolism. Mediastinum/Nodes: Esophagus unremarkable. Diffusely enlarged multinodular thyroid gland with mild transverse narrowing of the trachea. No thoracic adenopathy. Lungs/Pleura: Minimal subsegmental atelectasis at lung bases greater on RIGHT. Remaining lungs clear. No pleural effusion or pneumothorax. Upper Abdomen: No abnormalities identified Musculoskeletal: Unremarkable Review of the MIP images confirms the above findings. IMPRESSION: Positive exam for presence of large BILATERAL pulmonary emboli. Large saddle embolus at bifurcation of LEFT pulmonary artery extending into LEFT upper and LEFT lower lobes, with occlusive embolus in the LEFT upper lobe. Additional segmental embolus in RIGHT middle lobe. Positive for acute PE with CT evidence of right heart strain (RV/LV Ratio = 1.43) consistent with at least submassive (intermediate risk) PE. The presence of right heart strain has been associated with an increased risk of morbidity and mortality. Please activate Code PE by paging [phone]. Enlarged multinodular thyroid gland; follow-up non emergent thyroid ultrasound recommended. Critical Value/emergent results were called by telephone at the time of interpretation on 07/12/2019 at 2212 hrs to provider DR. Maudie Mercury, who verbally acknowledged these results. Electronically Signed   By: Lavonia Dana M.D.   On: 07/12/2019 22:13   Dg Chest Portable 1 View  Result Date: 07/12/2019 CLINICAL DATA:  Pt c/o central CP, SOB, nausea, and weakness. Pt hx CAD, asthma, arthritis. Pt is a former smoker, quit in the 80's. EXAM: PORTABLE CHEST 1 VIEW  COMPARISON:  04/26/2019 FINDINGS: Borderline enlargement of the cardiopericardial silhouette. This is stable. No mediastinal or hilar masses. Clear lungs.  No pleural  effusion or pneumothorax. Skeletal structures are grossly intact. IMPRESSION: No active disease. Electronically Signed   By: Lajean Manes M.D.   On: 07/12/2019 16:45    Pending Labs Unresulted Labs (From admission, onward)    Start     Ordered   07/14/19 0500  Heparin level (unfractionated)  Daily,   R     07/12/19 2200   07/13/19 0700  Heparin level (unfractionated)  Once-Timed,   STAT     07/12/19 2200   07/13/19 0500  Comprehensive metabolic panel  Tomorrow morning,   R     07/12/19 2137   07/13/19 0500  CBC  Tomorrow morning,   R     07/12/19 2137   07/13/19 0500  CBC  Daily,   R     07/12/19 2200   07/13/19 0500  Hemoglobin A1c  Tomorrow morning,   R     07/12/19 2200   07/13/19 0500  Lipid panel  Tomorrow morning,   R     07/12/19 2200          Vitals/Pain Today's Vitals   07/12/19 1615 07/12/19 1630 07/12/19 1800 07/12/19 2032  BP: 103/62 109/83 100/79 119/78  Pulse: 95 99 97 90  Resp: 16 16 18 12   Temp:      TempSrc:      SpO2: (!) 88% 98% 99% 100%  Weight:      Height:      PainSc:    0-No pain    Isolation Precautions No active isolations  Medications Medications  0.9 %  sodium chloride infusion (has no administration in time range)  acetaminophen (TYLENOL) tablet 650 mg (has no administration in time range)    Or  acetaminophen (TYLENOL) suppository 650 mg (has no administration in time range)  heparin ADULT infusion 100 units/mL (25000 units/22mL sodium chloride 0.45%) (1,200 Units/hr Intravenous New Bag/Given 07/12/19 2225)  pantoprazole (PROTONIX) injection 40 mg (has no administration in time range)  ondansetron (ZOFRAN) injection 4 mg (4 mg Intravenous Given 07/12/19 1630)  sodium chloride 0.9 % bolus 500 mL (0 mLs Intravenous Stopped 07/12/19 2029)  iohexol (OMNIPAQUE) 350 MG/ML  injection 75 mL (75 mLs Intravenous Contrast Given 07/12/19 2138)  heparin bolus via infusion 4,000 Units (4,000 Units Intravenous Bolus from Bag 07/12/19 2226)    Mobility walks Low fall risk   Focused Assessments    R Recommendations: See Admitting Provider Note  Report given to:   Additional Notes:

## 2019-07-13 ENCOUNTER — Other Ambulatory Visit: Payer: Self-pay

## 2019-07-13 ENCOUNTER — Inpatient Hospital Stay (HOSPITAL_COMMUNITY): Payer: Medicare HMO

## 2019-07-13 ENCOUNTER — Encounter (HOSPITAL_COMMUNITY): Payer: Self-pay | Admitting: *Deleted

## 2019-07-13 DIAGNOSIS — I361 Nonrheumatic tricuspid (valve) insufficiency: Secondary | ICD-10-CM

## 2019-07-13 DIAGNOSIS — J4520 Mild intermittent asthma, uncomplicated: Secondary | ICD-10-CM

## 2019-07-13 DIAGNOSIS — R079 Chest pain, unspecified: Secondary | ICD-10-CM

## 2019-07-13 DIAGNOSIS — R778 Other specified abnormalities of plasma proteins: Secondary | ICD-10-CM

## 2019-07-13 DIAGNOSIS — F419 Anxiety disorder, unspecified: Secondary | ICD-10-CM

## 2019-07-13 DIAGNOSIS — I2692 Saddle embolus of pulmonary artery without acute cor pulmonale: Principal | ICD-10-CM

## 2019-07-13 LAB — COMPREHENSIVE METABOLIC PANEL
ALT: 31 U/L (ref 0–44)
AST: 36 U/L (ref 15–41)
Albumin: 3.3 g/dL — ABNORMAL LOW (ref 3.5–5.0)
Alkaline Phosphatase: 77 U/L (ref 38–126)
Anion gap: 9 (ref 5–15)
BUN: 22 mg/dL (ref 8–23)
CO2: 20 mmol/L — ABNORMAL LOW (ref 22–32)
Calcium: 9.8 mg/dL (ref 8.9–10.3)
Chloride: 109 mmol/L (ref 98–111)
Creatinine, Ser: 1.07 mg/dL — ABNORMAL HIGH (ref 0.44–1.00)
GFR calc Af Amer: 58 mL/min — ABNORMAL LOW (ref >60)
GFR calc non Af Amer: 50 mL/min — ABNORMAL LOW (ref >60)
Glucose, Bld: 113 mg/dL — ABNORMAL HIGH (ref 70–99)
Potassium: 4.5 mmol/L (ref 3.5–5.1)
Sodium: 138 mmol/L (ref 135–145)
Total Bilirubin: 1.1 mg/dL (ref 0.3–1.2)
Total Protein: 6.4 g/dL — ABNORMAL LOW (ref 6.5–8.1)

## 2019-07-13 LAB — CBC
HCT: 35 % — ABNORMAL LOW (ref 36.0–46.0)
Hemoglobin: 11.6 g/dL — ABNORMAL LOW (ref 12.0–15.0)
MCH: 32 pg (ref 26.0–34.0)
MCHC: 33.1 g/dL (ref 30.0–36.0)
MCV: 96.7 fL (ref 80.0–100.0)
Platelets: 173 10*3/uL (ref 150–400)
RBC: 3.62 MIL/uL — ABNORMAL LOW (ref 3.87–5.11)
RDW: 13.1 % (ref 11.5–15.5)
WBC: 9.3 10*3/uL (ref 4.0–10.5)
nRBC: 0 % (ref 0.0–0.2)

## 2019-07-13 LAB — ECHOCARDIOGRAM COMPLETE
Height: 66 in
Weight: 3742.53 oz

## 2019-07-13 LAB — SARS CORONAVIRUS 2 (TAT 6-24 HRS): SARS Coronavirus 2: NEGATIVE

## 2019-07-13 LAB — HEPARIN LEVEL (UNFRACTIONATED)
Heparin Unfractionated: 1.03 IU/mL — ABNORMAL HIGH (ref 0.30–0.70)
Heparin Unfractionated: 1.26 IU/mL — ABNORMAL HIGH (ref 0.30–0.70)

## 2019-07-13 LAB — LIPID PANEL
Cholesterol: 198 mg/dL (ref 0–200)
HDL: 55 mg/dL (ref >40)
LDL Cholesterol: 137 mg/dL — ABNORMAL HIGH (ref 0–99)
Total CHOL/HDL Ratio: 3.6 RATIO
Triglycerides: 29 mg/dL (ref <150)
VLDL: 6 mg/dL (ref 0–40)

## 2019-07-13 LAB — HEMOGLOBIN A1C
Hgb A1c MFr Bld: 5.4 % (ref 4.8–5.6)
Mean Plasma Glucose: 108.28 mg/dL

## 2019-07-13 LAB — MRSA PCR SCREENING: MRSA by PCR: NEGATIVE

## 2019-07-13 LAB — TROPONIN I (HIGH SENSITIVITY): Troponin I (High Sensitivity): 2344 ng/L (ref <18)

## 2019-07-13 MED ORDER — HEPARIN (PORCINE) 25000 UT/250ML-% IV SOLN
900.0000 [IU]/h | INTRAVENOUS | Status: DC
  Filled 2019-07-13: qty 250

## 2019-07-13 MED ORDER — POLYVINYL ALCOHOL 1.4 % OP SOLN
1.0000 [drp] | Freq: Two times a day (BID) | OPHTHALMIC | Status: DC
  Administered 2019-07-13 – 2019-07-14 (×4): 1 [drp] via OPHTHALMIC
  Filled 2019-07-13: qty 15

## 2019-07-13 MED ORDER — METOPROLOL TARTRATE 12.5 MG HALF TABLET
12.5000 mg | ORAL_TABLET | Freq: Two times a day (BID) | ORAL | Status: DC
  Administered 2019-07-13 (×2): 12.5 mg via ORAL
  Filled 2019-07-13 (×2): qty 1

## 2019-07-13 MED ORDER — ATORVASTATIN CALCIUM 10 MG PO TABS
20.0000 mg | ORAL_TABLET | Freq: Every day | ORAL | Status: DC
  Administered 2019-07-14: 20 mg via ORAL
  Filled 2019-07-13 (×3): qty 2

## 2019-07-13 NOTE — Progress Notes (Signed)
NAME:  Karen Owens, MRN:  [id number], DOB:  11/26/1940, LOS: 1 ADMISSION DATE:  07/12/2019, CONSULTATION DATE:  07/12/2019 REFERRING MD:  Jani Gravel  CHIEF COMPLAINT:  Chest pain/shortness of breath   Brief History   78yo female w/ a past medical history significant for hypertension, hyperlipidemia, asthma and DVT x1 year ago, not currently on anticoagulation, that presented to the ED this evening with complaints of shortness of breath and chest pain without relief from Nitro x3 doses. ED workup included a CTA of chest which revealed a large saddle pulmonary embolus. Critical care was therefore consulted for evaluation.   The patient reports approximately 1 year ago she was diagnosed w/a DVT and was placed on Eliquis but approx 2-3 weeks after taking it developed a GI bleed and therefore discontinued therapy. She did not undergo an endoscopy procedure at that time. Of note patient is a Jehovah witness, no blood products  Past Medical History  Past medical history: Arthritis Asthma CAD Hypertension Hyperlipidemia Prior DVT x1 year ago, not anticoagulated due to GI bleed   Significant Hospital Events   New Consult 11/1  Consults:  TRH primary  Procedures:  None  Significant Diagnostic Tests:  CTA-chest: Large saddle PE at bifurcation of the left PA extending into left upper and left lower lobes. Large embolus identified at bifurcation of right PA extending into upper lobe descending interlobar pulmonary artery and right lower lobe. RV/LV ratio 1.43  Micro Data:  N/A  Antimicrobials:  N/A  Interim history/subjective:    Objective   Blood pressure 119/70, pulse 72, temperature 97.6 F (36.4 C), temperature source Oral, resp. rate (!) 21, height 5\' 6"  (1.676 m), weight 106.1 kg, SpO2 96 %.        Intake/Output Summary (Last 24 hours) at 07/13/2019 1036 Last data filed at 07/12/2019 2029 Gross per 24 hour  Intake 500 ml  Output -  Net 500 ml   Filed Weights   07/12/19  1605 07/13/19 0034  Weight: 106.6 kg 106.1 kg    Examination: General: Alert and in no acute distress HENT: Normocephalic, moist oral mucosa Lungs: Lungs clear throughout, breath sounds equal Cardiovascular: *normal sinus rhythm, Heart sounds S1,S2, no lower extremity edema Abdomen: soft, round, non-tender, normal bowel sounds Extremities: no deformities, no edema Neuro: Alert and oriented x3, no focal deficits  Resolved Hospital Problem list     Assessment & Plan:  Submassive PE Continue heparin drip.  No indication for lytics, EKOS at present as she is is clinically stable Follow echocardiogram, lower extremity Dopplers She will need lifelong anticoagulation as this her second PE  Hx of GI Bleed Per patient, after taking eliquis for 2-3 weeks developed GI bleed Treatment stopped but did not have further diagnostics done in 2019 Consider GI eval Continue Protonix  Hx of Hypertension Hx of Hyperlipidemia Holding antihypertensives.  Continue statin   Best practice:  Diet: Regular  Pain/Anxiety/Delirium protocol (if indicated): N/A VAP protocol (if indicated): N/A DVT prophylaxis: On systemic heparin GI prophylaxis: Protonix Glucose control: N/A Mobility: As tolerated Code Status: Full  Family Communication: Patient updated.  Disposition: PCU  Labs   CBC: Recent Labs  Lab 07/12/19 1721 07/13/19 0745  WBC 12.6* 9.3  NEUTROABS 10.7*  --   HGB 11.6* 11.6*  HCT 35.5* 35.0*  MCV 98.3 96.7  PLT 168 [id number]    Basic Metabolic Panel: Recent Labs  Lab 07/12/19 1721 07/13/19 0745  NA 140 138  K 3.6 4.5  CL 109 109  CO2 22 20*  GLUCOSE 135* 113*  BUN 20 22  CREATININE 1.01* 1.07*  CALCIUM 9.8 9.8   GFR: Estimated Creatinine Clearance: 55.1 mL/min (A) (by C-G formula based on SCr of 1.07 mg/dL (H)). Recent Labs  Lab 07/12/19 1721 07/13/19 0745  WBC 12.6* 9.3    Liver Function Tests: Recent Labs  Lab 07/12/19 1721 07/13/19 0745  AST 26 36  ALT 20  31  ALKPHOS 73 77  BILITOT 0.5 1.1  PROT 6.3* 6.4*  ALBUMIN 3.5 3.3*   No results for input(s): LIPASE, AMYLASE in the last 168 hours. No results for input(s): AMMONIA in the last 168 hours.  ABG    Component Value Date/Time   TCO2 24 03/17/2009 1354     Coagulation Profile: No results for input(s): INR, PROTIME in the last 168 hours.  Cardiac Enzymes: No results for input(s): CKTOTAL, CKMB, CKMBINDEX, TROPONINI in the last 168 hours.  HbA1C: Hgb A1c MFr Bld  Date/Time Value Ref Range Status  07/13/2019 07:45 AM 5.4 4.8 - 5.6 % Final    Comment:    (NOTE) Pre diabetes:          5.7%-6.4% Diabetes:              >6.4% Glycemic control for   <7.0% adults with diabetes     CBG: No results for input(s): GLUCAP in the last 168 hours.  Marshell Garfinkel MD [facility] Pager [phone] If no answer call 336 [phone] 07/13/2019, 10:36 AM

## 2019-07-13 NOTE — Plan of Care (Signed)

## 2019-07-13 NOTE — Progress Notes (Signed)
PROGRESS NOTE    Karen Owens  [id number] DOB: 14-Dec-1940 DOA: 07/12/2019 PCP: Harlan Stains, MD   Brief Narrative:  Patient is a 78 year old female with history of asthma, multinodular goiter, history of DVT currently not on anticoagulation who presented with chest pain from home.  She was complaining of substernal chest pressure with radiation to the back.  In the emergency department she was found to be tachycardic, hypoxic on room air at 88%.  D-dimer was elevated.  Elevated troponin.  CT angiogram showed pulmonary embolism.  Started on heparin drip.  Due to size of the embolus, PCCM also consulted.She is a Occupational psychologist & Plan:   Principal Problem:   Acute pulmonary embolism (HCC) Active Problems:   Anxiety   Chest pain   Asthma   Elevated troponin   Large saddle  PE with right heart strain: CT angiogram showed large saddle PE at bifurcation of left PA extending into left upper/left lower lobe pulmonary embolism with evidence of right heart strain. Also noted large embolus at bifurcation of right PE extending into the upper lobe, descending interlobar pulmonary artery and right lower lobe.  RV/LV ratio 1.43 She has been started  on heparin drip.  Currently denies any chest pain or shortness of breath. Echocardiogram showed ejection fraction of 60-65%, right ventricular volume overload, right ventricular size moderately enlarged, left ventricular hypertrophy.  She has significantly elevated troponin that is from supply demand ischemia. She is hemodynamically stable so no need for lytic therapy or EKOS. She needs to be switched to oral anticoagulation at some point.  She has history of DVT about a year ago and was on Eliquis but she had GI bleeding so it was stopped. She never underwent any evaluation by GI at that point. I have discussed with Dr. Paulita Fujita, gastroenterology ,who will see the patient for possible EGD. Continue PPI  Hypertension: Currently blood stable.   Continue to monitor.  On metoprolol at home.  Hyperlipidemia:We will start on lipitor 20 mg daily.  Obesity: BMI of 37         DVT prophylaxis:Heparin [place] Code Status: Full Family Communication: None present at the bedside Disposition Plan: Home after full work-up   Consultants: GI, PCCM  Procedures: None  Antimicrobials: None Anti-infectives (From admission, onward)   None      Subjective:  Patient seen and examined the bedside this morning.  Hemodynamically stable.  Denies any chest pain or shortness of breath.  Comfortable  Objective: Vitals:   07/13/19 0350 07/13/19 0841 07/13/19 1137 07/13/19 1626  BP: 90/72 119/70 119/70 131/74  Pulse: 75 72 71 73  Resp: (!) 24 (!) 21 17 16   Temp: 98.9 F (37.2 C) 97.6 F (36.4 C) 98.7 F (37.1 C) 98.6 F (37 C)  TempSrc: Oral Oral Oral Oral  SpO2: 98% 96% 97% 96%  Weight:      Height:        Intake/Output Summary (Last 24 hours) at 07/13/2019 1632 Last data filed at 07/13/2019 1200 Gross per 24 hour  Intake 1399.94 ml  Output -  Net 1399.94 ml   Filed Weights   07/12/19 1605 07/13/19 0034  Weight: 106.6 kg 106.1 kg    Examination:  General exam: Appears calm and comfortable ,Not in distress, obese HEENT:PERRL,Oral mucosa moist, Ear/Nose normal on gross exam Respiratory system: Bilateral equal air entry, normal vesicular breath sounds, no wheezes or crackles  Cardiovascular system: S1 & S2 heard, RRR. No JVD, murmurs, rubs, gallops or clicks.  No pedal edema. Gastrointestinal system: Abdomen is nondistended, soft and nontender. No organomegaly or masses felt. Normal bowel sounds heard. Central nervous system: Alert and oriented. No focal neurological deficits. Extremities: No edema, no clubbing ,no cyanosis, distal peripheral pulses palpable. Skin: No rashes, lesions or ulcers,no icterus ,no pallor MSK: Normal muscle bulk,tone ,power Psychiatry: Judgement and insight appear normal. Mood & affect appropriate.      Data Reviewed: I have personally reviewed following labs and imaging studies  CBC: Recent Labs  Lab 07/12/19 1721 07/13/19 0745  WBC 12.6* 9.3  NEUTROABS 10.7*  --   HGB 11.6* 11.6*  HCT 35.5* 35.0*  MCV 98.3 96.7  PLT 168 [id number]   Basic Metabolic Panel: Recent Labs  Lab 07/12/19 1721 07/13/19 0745  NA 140 138  K 3.6 4.5  CL 109 109  CO2 22 20*  GLUCOSE 135* 113*  BUN 20 22  CREATININE 1.01* 1.07*  CALCIUM 9.8 9.8   GFR: Estimated Creatinine Clearance: 55.1 mL/min (A) (by C-G formula based on SCr of 1.07 mg/dL (H)). Liver Function Tests: Recent Labs  Lab 07/12/19 1721 07/13/19 0745  AST 26 36  ALT 20 31  ALKPHOS 73 77  BILITOT 0.5 1.1  PROT 6.3* 6.4*  ALBUMIN 3.5 3.3*   No results for input(s): LIPASE, AMYLASE in the last 168 hours. No results for input(s): AMMONIA in the last 168 hours. Coagulation Profile: No results for input(s): INR, PROTIME in the last 168 hours. Cardiac Enzymes: No results for input(s): CKTOTAL, CKMB, CKMBINDEX, TROPONINI in the last 168 hours. BNP (last 3 results) No results for input(s): PROBNP in the last 8760 hours. HbA1C: Recent Labs    07/13/19 0745  HGBA1C 5.4   CBG: No results for input(s): GLUCAP in the last 168 hours. Lipid Profile: Recent Labs    07/13/19 0745  CHOL 198  HDL 55  LDLCALC 137*  TRIG 29  CHOLHDL 3.6   Thyroid Function Tests: No results for input(s): TSH, T4TOTAL, FREET4, T3FREE, THYROIDAB in the last 72 hours. Anemia Panel: No results for input(s): VITAMINB12, FOLATE, FERRITIN, TIBC, IRON, RETICCTPCT in the last 72 hours. Sepsis Labs: No results for input(s): PROCALCITON, LATICACIDVEN in the last 168 hours.  Recent Results (from the past 240 hour(s))  SARS CORONAVIRUS 2 (TAT 6-24 HRS) Nasopharyngeal Nasopharyngeal Swab     Status: None   Collection Time: 07/12/19 11:46 PM   Specimen: Nasopharyngeal Swab  Result Value Ref Range Status   SARS Coronavirus 2 NEGATIVE NEGATIVE Final     Comment: (NOTE) SARS-CoV-2 target nucleic acids are NOT DETECTED. The SARS-CoV-2 RNA is generally detectable in upper and lower respiratory specimens during the acute phase of infection. Negative results do not preclude SARS-CoV-2 infection, do not rule out co-infections with other pathogens, and should not be used as the sole basis for treatment or other patient management decisions. Negative results must be combined with clinical observations, patient history, and epidemiological information. The expected result is Negative. Fact Sheet for Patients: SugarRoll.be Fact Sheet for Healthcare Providers: https://www.woods-mathews.com/ This test is not yet approved or cleared by the [place] FDA and  has been authorized for detection and/or diagnosis of SARS-CoV-2 by FDA under an Emergency Use Authorization (EUA). This EUA will remain  in effect (meaning this test can be used) for the duration of the COVID-19 declaration under Section 56 4(b)(1) of the Act, 21 U.S.C. section 360bbb-3(b)(1), unless the authorization is terminated or revoked sooner. Performed at [facility], [place] [address]  [place], [place] [address]   MRSA PCR Screening     Status: None   Collection Time: 07/13/19 12:59 AM   Specimen: Nasal Mucosa; Nasopharyngeal  Result Value Ref Range Status   MRSA by PCR NEGATIVE NEGATIVE Final    Comment:        The GeneXpert MRSA Assay (FDA approved for NASAL specimens only), is one component of a comprehensive MRSA colonization surveillance program. It is not intended to diagnose MRSA infection nor to guide or monitor treatment for MRSA infections. Performed at [facility], [place] [address], [place], [place] [address]          Radiology Studies: Ct Angio Chest Pe W And/or Wo Contrast  Result Date: 07/12/2019 CLINICAL DATA:  Substernal chest pain, nausea, weakness, coronary artery disease, asthma EXAM: CT  ANGIOGRAPHY CHEST WITH CONTRAST TECHNIQUE: Multidetector CT imaging of the chest was performed using the standard protocol during bolus administration of intravenous contrast. Multiplanar CT image reconstructions and MIPs were obtained to evaluate the vascular anatomy. CONTRAST:  62mL OMNIPAQUE IOHEXOL 350 MG/ML SOLN IV COMPARISON:  04/27/2019 FINDINGS: Cardiovascular: Aorta normal caliber without aneurysm or dissection. No pericardial effusion. Multiple pulmonary emboli are identified. Large saddle embolus at bifurcation of LEFT pulmonary artery extending into LEFT upper and LEFT lower lobes. LEFT upper lobe thrombus appears occlusive. Large embolus identified at bifurcation of RIGHT pulmonary artery extending into upper lobe, descending interlobar pulmonary artery and RIGHT lower lobe. Additional segmental embolus in RIGHT middle lobe. Elevated RV/LV ratio of 1.43 indicating RIGHT heart strain and at least submassive pulmonary embolism. Mediastinum/Nodes: Esophagus unremarkable. Diffusely enlarged multinodular thyroid gland with mild transverse narrowing of the trachea. No thoracic adenopathy. Lungs/Pleura: Minimal subsegmental atelectasis at lung bases greater on RIGHT. Remaining lungs clear. No pleural effusion or pneumothorax. Upper Abdomen: No abnormalities identified Musculoskeletal: Unremarkable Review of the MIP images confirms the above findings. IMPRESSION: Positive exam for presence of large BILATERAL pulmonary emboli. Large saddle embolus at bifurcation of LEFT pulmonary artery extending into LEFT upper and LEFT lower lobes, with occlusive embolus in the LEFT upper lobe. Additional segmental embolus in RIGHT middle lobe. Positive for acute PE with CT evidence of right heart strain (RV/LV Ratio = 1.43) consistent with at least submassive (intermediate risk) PE. The presence of right heart strain has been associated with an increased risk of morbidity and mortality. Please activate Code PE by paging  [phone]. Enlarged multinodular thyroid gland; follow-up non emergent thyroid ultrasound recommended. Critical Value/emergent results were called by telephone at the time of interpretation on 07/12/2019 at 2212 hrs to provider DR. Maudie Mercury, who verbally acknowledged these results. Electronically Signed   By: Lavonia Dana M.D.   On: 07/12/2019 22:13   Dg Chest Portable 1 View  Result Date: 07/12/2019 CLINICAL DATA:  Pt c/o central CP, SOB, nausea, and weakness. Pt hx CAD, asthma, arthritis. Pt is a former smoker, quit in the 80's. EXAM: PORTABLE CHEST 1 VIEW COMPARISON:  04/26/2019 FINDINGS: Borderline enlargement of the cardiopericardial silhouette. This is stable. No mediastinal or hilar masses. Clear lungs.  No pleural effusion or pneumothorax. Skeletal structures are grossly intact. IMPRESSION: No active disease. Electronically Signed   By: Lajean Manes M.D.   On: 07/12/2019 16:45        Scheduled Meds: . metoprolol tartrate  12.5 mg Oral BID  . pantoprazole (PROTONIX) IV  40 mg Intravenous Q24H  . polyvinyl alcohol  1 drop Both Eyes BID   Continuous Infusions: . heparin 1,000 Units/hr (07/13/19 1029)  LOS: 1 day    Time spent:35 mins. More than 50% of that time was spent in counseling and/or coordination of care.      Shelly Coss, MD [facility] Pager [phone]  If 7PM-7AM, please contact night-coverage [email or web address] Password [facility] 07/13/2019, 4:32 PM

## 2019-07-13 NOTE — Progress Notes (Signed)
ANTICOAGULATION CONSULT NOTE  Pharmacy Consult:  Heparin Indication: chest pain/ACS  Allergies  Allergen Reactions  . Tuberculin Tests Hives  . Other Other (See Comments)    Jehovah's Witness- NO BLOOD PRODUCTS!!    Patient Measurements: Height: 5\' 6"  (167.6 cm) Weight: 233 lb 14.5 oz (106.1 kg) IBW/kg (Calculated) : 59.3 Heparin Dosing Weight: 88 kg  Vital Signs: Temp: 98.6 F (37 C) (11/02 1626) Temp Source: Oral (11/02 1626) BP: 131/74 (11/02 1626) Pulse Rate: 73 (11/02 1626)  Labs: Recent Labs    07/12/19 1721 07/12/19 2027 07/13/19 0745 07/13/19 1825  HGB 11.6*  --  11.6*  --   HCT 35.5*  --  35.0*  --   PLT 168  --  173  --   HEPARINUNFRC  --   --  1.03* 1.26*  CREATININE 1.01*  --  1.07*  --   TROPONINIHS 267* 965* 2,344*  --     Estimated Creatinine Clearance: 55.1 mL/min (A) (by C-G formula based on SCr of 1.07 mg/dL (H)).   Medical History: Past Medical History:  Diagnosis Date  . Arthritis   . Asthma   . Complication of anesthesia    difficulty waking up  . DVT (deep venous thrombosis) ([place])   . Refusal of blood transfusions as patient is Jehovah's Witness   . Thyroid nodule      Assessment: 62 YOF presented with chest discomfort and Pharmacy consulted to dose IV heparin for ACS.  Patient's d-dimer is also elevated and CTA ordered.  Baseline labs and home meds reviewed.  Initial heparin level above goal range at 1.26 after reduction to 1000 units/hr.  Heparin was drawn from patient's left hand and IV heparin is running via right side. No overt bleeding or complications noted.  Goal of Therapy:  Heparin level 0.3-0.7 units/ml Monitor platelets by anticoagulation protocol: Yes   Plan:  Hold Heparin IV x1 hour then reduce to 700 units/hr.  Recheck 8 hr heparin level - AM level ok Daily heparin level and CBC F/u plans for oral anticoagulation eventually.   Sloan Leiter, PharmD, BCPS, BCCCP Clinical Pharmacist Clinical phone 07/13/2019  until 10:30P [phone] Please refer to [facility] for [place] numbers  07/13/2019 7:44 PM

## 2019-07-13 NOTE — Plan of Care (Signed)
  Problem: Clinical Measurements: Goal: Ability to maintain clinical measurements within normal limits will improve Outcome: Progressing Goal: Respiratory complications will improve Outcome: Progressing Goal: Cardiovascular complication will be avoided Outcome: Progressing   

## 2019-07-13 NOTE — Progress Notes (Signed)
  Echocardiogram 2D Echocardiogram has been performed.  Karen Owens 07/13/2019, 9:07 AM

## 2019-07-13 NOTE — Progress Notes (Signed)
ANTICOAGULATION CONSULT NOTE  Pharmacy Consult:  Heparin Indication: chest pain/ACS  Allergies  Allergen Reactions  . Tuberculin Tests Hives  . Other Other (See Comments)    Jehovah's Witness- NO BLOOD PRODUCTS!!    Patient Measurements: Height: 5\' 6"  (167.6 cm) Weight: 233 lb 14.5 oz (106.1 kg) IBW/kg (Calculated) : 59.3 Heparin Dosing Weight: 88 kg  Vital Signs: Temp: 97.6 F (36.4 C) (11/02 0841) Temp Source: Oral (11/02 0841) BP: 119/70 (11/02 0841) Pulse Rate: 72 (11/02 0841)  Labs: Recent Labs    07/12/19 1721 07/12/19 2027 07/13/19 0745  HGB 11.6*  --  11.6*  HCT 35.5*  --  35.0*  PLT 168  --  173  HEPARINUNFRC  --   --  1.03*  CREATININE 1.01*  --  1.07*  TROPONINIHS 267* 965* 2,344*    Estimated Creatinine Clearance: 55.1 mL/min (A) (by C-G formula based on SCr of 1.07 mg/dL (H)).   Medical History: Past Medical History:  Diagnosis Date  . Arthritis   . Asthma   . Complication of anesthesia    difficulty waking up  . DVT (deep venous thrombosis) ([place])   . Refusal of blood transfusions as patient is Jehovah's Witness   . Thyroid nodule      Assessment: 34 YOF presented with chest discomfort and Pharmacy consulted to dose IV heparin for ACS.  Patient's d-dimer is also elevated and CTA ordered.  Baseline labs and home meds reviewed.  Initial heparin level above goal range at 1.03.  RN suspects level collected close to where heparin infusing since phlebotomy had difficult time sticking patient.  No overt bleeding or complications noted.  Goal of Therapy:  Heparin level 0.3-0.7 units/ml Monitor platelets by anticoagulation protocol: Yes   Plan:  Decrease IV heparin gtt to 1000 units/hr Check 8 hr heparin level Daily heparin level and CBC F/u plans for oral anticoagulation eventually.   Marguerite Olea, [facility] Clinical Pharmacist Phone [phone]  07/13/2019 9:59 AM

## 2019-07-13 NOTE — Plan of Care (Signed)
  Problem: Clinical Measurements: Goal: Ability to maintain clinical measurements within normal limits will improve Outcome: Progressing Goal: Diagnostic test results will improve Outcome: Progressing Goal: Respiratory complications will improve Outcome: Progressing Goal: Cardiovascular complication will be avoided Outcome: Progressing   

## 2019-07-14 ENCOUNTER — Inpatient Hospital Stay (HOSPITAL_COMMUNITY): Payer: Medicare HMO

## 2019-07-14 DIAGNOSIS — I2699 Other pulmonary embolism without acute cor pulmonale: Secondary | ICD-10-CM

## 2019-07-14 LAB — CBC
HCT: 33.9 % — ABNORMAL LOW (ref 36.0–46.0)
Hemoglobin: 10.9 g/dL — ABNORMAL LOW (ref 12.0–15.0)
MCH: 31.6 pg (ref 26.0–34.0)
MCHC: 32.2 g/dL (ref 30.0–36.0)
MCV: 98.3 fL (ref 80.0–100.0)
Platelets: 172 10*3/uL (ref 150–400)
RBC: 3.45 MIL/uL — ABNORMAL LOW (ref 3.87–5.11)
RDW: 13.1 % (ref 11.5–15.5)
WBC: 8.9 10*3/uL (ref 4.0–10.5)
nRBC: 0 % (ref 0.0–0.2)

## 2019-07-14 LAB — HEPARIN LEVEL (UNFRACTIONATED)
Heparin Unfractionated: 0.75 IU/mL — ABNORMAL HIGH (ref 0.30–0.70)
Heparin Unfractionated: 0.4 IU/mL (ref 0.30–0.70)
Heparin Unfractionated: 0.19 IU/mL — ABNORMAL LOW (ref 0.30–0.70)

## 2019-07-14 MED ORDER — METOPROLOL TARTRATE 12.5 MG HALF TABLET
12.5000 mg | ORAL_TABLET | Freq: Two times a day (BID) | ORAL | Status: DC
  Administered 2019-07-14 – 2019-07-15 (×3): 12.5 mg via ORAL
  Filled 2019-07-14 (×3): qty 1

## 2019-07-14 NOTE — Progress Notes (Signed)
   07/14/19 1620  Vitals  Temp 98.4 F (36.9 C)  Temp Source Oral  BP (!) 162/61  MAP (mmHg) 92  BP Location Left Arm  BP Method Automatic  Patient Position (if appropriate) Lying  Pulse Rate 70  Pulse Rate Source Monitor  ECG Heart Rate 71  Cardiac Rhythm NSR  Resp 17  Oxygen Therapy  SpO2 98 %  O2 Device Room Air  Pre-WUA / WUA Start  [place] Agitation Sedation Scale (RASS) 0  RASS Goal 0  Pain Assessment  Pain Scale 0-10  Pain Score 0  POSS Scale ([place] Opioid Sedation Scale)  POSS *See Group Information* 1-Acceptable,Awake and alert  PCA/Epidural/Spinal Assessment  Respiratory Pattern Regular;Unlabored  Glasgow Coma Scale  Eye Opening 4  Best Verbal Response (NON-intubated) 5  Best Motor Response 6  Glasgow Coma Scale Score 15  MEWS Score  MEWS RR 0  MEWS Pulse 0  MEWS Systolic 0  MEWS LOC 0  MEWS Temp 0  MEWS Score 0  MEWS Score Color Green  pt having some bigeminy beats and bp elevated, notified MD new orders rec'd

## 2019-07-14 NOTE — Progress Notes (Signed)
NAME:  Karen Owens, MRN:  [id number], DOB:  01-Jun-1941, LOS: 2 ADMISSION DATE:  07/12/2019, CONSULTATION DATE:  07/12/2019 REFERRING MD:  Jani Gravel  CHIEF COMPLAINT:  Chest pain/shortness of breath   Brief History   78yo female w/ a past medical history significant for hypertension, hyperlipidemia, asthma and DVT x1 year ago, not currently on anticoagulation, that presented to the ED this evening with complaints of shortness of breath and chest pain without relief from Nitro x3 doses. ED workup included a CTA of chest which revealed a large saddle pulmonary embolus. Critical care was therefore consulted for evaluation.   The patient reports approximately 1 year ago she was diagnosed w/a DVT and was placed on Eliquis but approx 2-3 weeks after taking it developed a GI bleed and therefore discontinued therapy. She did not undergo an endoscopy procedure at that time. Of note patient is a Jehovah witness, no blood products  Past Medical History  Past medical history: Arthritis Asthma CAD Hypertension Hyperlipidemia Prior DVT x1 year ago, not anticoagulated due to GI bleed   Significant Hospital Events   New Consult 11/1  Consults:  TRH primary  Procedures:  None  Significant Diagnostic Tests:  CTA-chest: Large saddle PE at bifurcation of the left PA extending into left upper and left lower lobes. Large embolus identified at bifurcation of right PA extending into upper lobe descending interlobar pulmonary artery and right lower lobe. RV/LV ratio 1.43  Echocardiogram 11/2-LVEF 60 - 65%, moderate reduction in RV systolic function with moderate enlargement.  Moderate pulmonary hypertension with RV systolic pressure of [id number]  Micro Data:  N/A  Antimicrobials:  N/A  Interim history/subjective:  No acute events.  Sitting up in chair.  Denies any dyspnea, chest..  Feels well  Objective   Blood pressure (!) 145/86, pulse 69, temperature 98.9 F (37.2 C), temperature source Oral, resp.  rate 18, height 5\' 6"  (1.676 m), weight 107.3 kg, SpO2 97 %.        Intake/Output Summary (Last 24 hours) at 07/14/2019 1009 Last data filed at 07/14/2019 [id number] Gross per 24 hour  Intake 1277.62 ml  Output -  Net 1277.62 ml   Filed Weights   07/12/19 1605 07/13/19 0034 07/14/19 0500  Weight: 106.6 kg 106.1 kg 107.3 kg    Examination: Gen:      No acute distress HEENT:  EOMI, sclera anicteric Neck:     No masses; no thyromegaly Lungs:    Clear to auscultation bilaterally; normal respiratory effort CV:         Regular rate and rhythm; no murmurs Abd:      + bowel sounds; soft, non-tender; no palpable masses, no distension Ext:    No edema; adequate peripheral perfusion Skin:      Warm and dry; no rash Neuro: alert and oriented x 3 Psych: normal mood and affect  Resolved Hospital Problem list     Assessment & Plan:  Submassive PE, pulmonary hypertension Continue heparin drip with transition to oral anticoagulants.   No indication for lytics, EKOS at present as she is is clinically stable She will need lifelong anticoagulation as this her second PE  Hx of GI Bleed Per patient, after taking eliquis for 2-3 weeks developed GI bleed Treatment stopped but did not have further diagnostics done in 2019 GI currently evaluating. Continue Protonix  Hx of Hypertension Hx of Hyperlipidemia Holding antihypertensives.  Continue statin  PCCM will be available as needed during this admission.  We will make follow-up in  pulmonary clinic for reevaluation and repeat echocardiogram as an outpatient   Best practice:  Diet: Regular  Pain/Anxiety/Delirium protocol (if indicated): N/A VAP protocol (if indicated): N/A DVT prophylaxis: On systemic heparin GI prophylaxis: Protonix Glucose control: N/A Mobility: As tolerated Code Status: Full  Family Communication: Patient updated.  Disposition: PCU  Labs   CBC: Recent Labs  Lab 07/12/19 1721 07/13/19 0745 07/14/19 0227  WBC 12.6*  9.3 8.9  NEUTROABS 10.7*  --   --   HGB 11.6* 11.6* 10.9*  HCT 35.5* 35.0* 33.9*  MCV 98.3 96.7 98.3  PLT 168 173 [id number]    Basic Metabolic Panel: Recent Labs  Lab 07/12/19 1721 07/13/19 0745  NA 140 138  K 3.6 4.5  CL 109 109  CO2 22 20*  GLUCOSE 135* 113*  BUN 20 22  CREATININE 1.01* 1.07*  CALCIUM 9.8 9.8   GFR: Estimated Creatinine Clearance: 55.4 mL/min (A) (by C-G formula based on SCr of 1.07 mg/dL (H)). Recent Labs  Lab 07/12/19 1721 07/13/19 0745 07/14/19 0227  WBC 12.6* 9.3 8.9    Liver Function Tests: Recent Labs  Lab 07/12/19 1721 07/13/19 0745  AST 26 36  ALT 20 31  ALKPHOS 73 77  BILITOT 0.5 1.1  PROT 6.3* 6.4*  ALBUMIN 3.5 3.3*   No results for input(s): LIPASE, AMYLASE in the last 168 hours. No results for input(s): AMMONIA in the last 168 hours.  ABG    Component Value Date/Time   TCO2 24 03/17/2009 1354     Coagulation Profile: No results for input(s): INR, PROTIME in the last 168 hours.  Cardiac Enzymes: No results for input(s): CKTOTAL, CKMB, CKMBINDEX, TROPONINI in the last 168 hours.  HbA1C: Hgb A1c MFr Bld  Date/Time Value Ref Range Status  07/13/2019 07:45 AM 5.4 4.8 - 5.6 % Final    Comment:    (NOTE) Pre diabetes:          5.7%-6.4% Diabetes:              >6.4% Glycemic control for   <7.0% adults with diabetes     CBG: No results for input(s): GLUCAP in the last 168 hours.  Marshell Garfinkel MD [facility] Pager [phone] If no answer call 336 [phone] 07/14/2019, 10:09 AM

## 2019-07-14 NOTE — Plan of Care (Signed)

## 2019-07-14 NOTE — Evaluation (Signed)
Physical Therapy Evaluation Patient Details Name: Karen Owens MRN: [id number] DOB: Jun 23, 1941 Today's Date: 07/14/2019   History of Present Illness  78 y.o. female, w h/o asthma, multinodular goiter, h/o DVT, chest pain (prinzmetals angina) w prior catheterization 06/09/2009 -> negative for CAD, ,  C/o chest pain and SOB, found to have acute PE.  Clinical Impression  Pt demonstrates deficits in endurance, functional mobility, gait, balance, power. Pt is able to perform bed mobility, transfer, and ambulate household distances without physical assistance. Pt does require use of RW to mitigate balance deficits at this time. Pt will benefit from continued acute PT services to reduce falls risk restore her prior level of function.    Follow Up Recommendations Outpatient PT(cardiopulmonary rehab)    Equipment Recommendations  None recommended by PT    Recommendations for Other Services       Precautions / Restrictions Precautions Precautions: Fall Restrictions Weight Bearing Restrictions: No      Mobility  Bed Mobility Overal bed mobility: Modified Independent             General bed mobility comments: increased time and elevation of head of bed  Transfers Overall transfer level: Needs assistance Equipment used: None Transfers: Sit to/from Stand Sit to Stand: Supervision            Ambulation/Gait Ambulation/Gait assistance: Supervision;Min guard Gait Distance (Feet): 70 Feet Assistive device: Rolling walker (2 wheeled) Gait Pattern/deviations: Step-through pattern;Decreased stride length Gait velocity: reduced Gait velocity interpretation: <1.8 ft/sec, indicate of risk for recurrent falls General Gait Details: pt with increased sway and high guard for initial 5 feet ambulated without device (minG). Pt improves to supervision with use of RW with more steady step through gait pattern  Stairs            Wheelchair Mobility    Modified Rankin (Stroke Patients  Only)       Balance Overall balance assessment: Needs assistance Sitting-balance support: No upper extremity supported;Feet supported Sitting balance-Leahy Scale: Good     Standing balance support: Bilateral upper extremity supported Standing balance-Leahy Scale: Good Standing balance comment: supervision                             Pertinent Vitals/Pain Pain Assessment: No/denies pain    Home Living Family/patient expects to be discharged to:: Private residence Living Arrangements: Children;Other relatives Available Help at Discharge: Family;Available PRN/intermittently(moving in with daughter and grandson) Type of Home: House Home Access: Stairs to enter Entrance Stairs-Rails: None Entrance Stairs-Number of Steps: 1 Home Layout: Two level;Able to live on main level with bedroom/bathroom Home Equipment: Bedside commode;Walker - 2 wheels;Cane - single point      Prior Function Level of Independence: Independent               Hand Dominance   Dominant Hand: Right    Extremity/Trunk Assessment   Upper Extremity Assessment Upper Extremity Assessment: RUE deficits/detail(pt unable to bend R elbow 2/2 IV line placement)    Lower Extremity Assessment Lower Extremity Assessment: Overall WFL for tasks assessed    Cervical / Trunk Assessment Cervical / Trunk Assessment: Normal  Communication   Communication: No difficulties  Cognition Arousal/Alertness: Awake/alert Behavior During Therapy: WFL for tasks assessed/performed Overall Cognitive Status: Within Functional Limits for tasks assessed  General Comments General comments (skin integrity, edema, etc.): pt reports mild SOB during ambulation however saturating from 93-97% on room air    Exercises     Assessment/Plan    PT Assessment Patient needs continued PT services  PT Problem List Decreased activity tolerance;Decreased  balance;Decreased mobility;Decreased knowledge of use of DME;Decreased safety awareness;Cardiopulmonary status limiting activity       PT Treatment Interventions DME instruction;Gait training;Stair training;Functional mobility training;Therapeutic activities;Therapeutic exercise;Balance training;Neuromuscular re-education;Patient/family education    PT Goals (Current goals can be found in the Care Plan section)  Acute Rehab PT Goals Patient Stated Goal: To improve activity tolerance PT Goal Formulation: With patient Time For Goal Achievement: 07/28/19 Potential to Achieve Goals: Good    Frequency Min 3X/week   Barriers to discharge        Co-evaluation               AM-PAC PT "6 Clicks" Mobility  Outcome Measure Help needed turning from your back to your side while in a flat bed without using bedrails?: None Help needed moving from lying on your back to sitting on the side of a flat bed without using bedrails?: None Help needed moving to and from a bed to a chair (including a wheelchair)?: None Help needed standing up from a chair using your arms (e.g., wheelchair or bedside chair)?: None Help needed to walk in hospital room?: None Help needed climbing 3-5 steps with a railing? : A Little 6 Click Score: 23    End of Session Equipment Utilized During Treatment: (none) Activity Tolerance: Patient tolerated treatment well Patient left: in chair;with call bell/phone within reach Nurse Communication: Mobility status PT Visit Diagnosis: Other abnormalities of gait and mobility (R26.89)    [id number] PT Time Calculation (min) (ACUTE ONLY): 24 min   Charges:   PT Evaluation $PT Eval Low Complexity: Fidelity, PT, DPT Acute Rehabilitation Pager: [phone]   Zenaida Niece 07/14/2019, 9:21 AM

## 2019-07-14 NOTE — TOC Progression Note (Signed)
Transition of Care [facility]) - Progression Note    Patient Details  Name: Karen Owens MRN: [id number] Date of Birth: 12-10-1940  [facility] CM/SW Contact  Zenon Mayo, RN Phone Number: 07/14/2019, 4:52 PM  Clinical Narrative:    NCM spoke with patient and she would like NCM to call her tomorrow to talk about outpt physical therapy.         Expected Discharge Plan and Services                                                 Social Determinants of Health (SDOH) Interventions    Readmission Risk Interventions No flowsheet data found.

## 2019-07-14 NOTE — Progress Notes (Signed)
ANTICOAGULATION CONSULT NOTE  Pharmacy Consult:  Heparin Indication: chest pain/ACS  Allergies  Allergen Reactions  . Tuberculin Tests Hives  . Other Other (See Comments)    Jehovah's Witness- NO BLOOD PRODUCTS!!    Patient Measurements: Height: 5\' 6"  (167.6 cm) Weight: 236 lb 8.9 oz (107.3 kg) IBW/kg (Calculated) : 59.3 Heparin Dosing Weight: 88 kg  Vital Signs: Temp: 98.8 F (37.1 C) (11/03 1938) Temp Source: Oral (11/03 1938) BP: 122/67 (11/03 1938) Pulse Rate: 74 (11/03 1938)  Labs: Recent Labs    07/12/19 1721 07/12/19 2027 07/13/19 0745  07/14/19 0227 07/14/19 1200 07/14/19 1956  HGB 11.6*  --  11.6*  --  10.9*  --   --   HCT 35.5*  --  35.0*  --  33.9*  --   --   PLT 168  --  173  --  172  --   --   HEPARINUNFRC  --   --  1.03*   < > 0.75* 0.40 0.19*  CREATININE 1.01*  --  1.07*  --   --   --   --   TROPONINIHS 267* 965* 2,344*  --   --   --   --    < > = values in this interval not displayed.    Estimated Creatinine Clearance: 55.4 mL/min (A) (by C-G formula based on SCr of 1.07 mg/dL (H)).  Assessment: 54 YOF presented with chest discomfort found to have submassive PE  Recently had GI bleed and was off apixaban x 2 - 3 weeks  Hep lvl low at 0.19  Goal of Therapy:  Heparin level 0.3-0.7 units/ml Monitor platelets by anticoagulation protocol: Yes   Plan:  Increase heparin to 750 units/hr Daily hep lvl cbc  Barth Kirks, PharmD, BCPS, BCCCP Clinical Pharmacist [phone]  Please check AMION for all [place] numbers  07/14/2019 8:57 PM

## 2019-07-14 NOTE — Progress Notes (Signed)
ANTICOAGULATION CONSULT NOTE  Pharmacy Consult:  Heparin Indication: chest pain/ACS  Allergies  Allergen Reactions  . Tuberculin Tests Hives  . Other Other (See Comments)    Jehovah's Witness- NO BLOOD PRODUCTS!!    Patient Measurements: Height: 5\' 6"  (167.6 cm) Weight: 236 lb 8.9 oz (107.3 kg) IBW/kg (Calculated) : 59.3 Heparin Dosing Weight: 88 kg  Vital Signs: Temp: 97.8 F (36.6 C) (11/03 1138) Temp Source: Oral (11/03 1138) BP: 155/90 (11/03 1138) Pulse Rate: 72 (11/03 1138)  Labs: Recent Labs    07/12/19 1721 07/12/19 2027  07/13/19 0745 07/13/19 1825 07/14/19 0227 07/14/19 1200  HGB 11.6*  --   --  11.6*  --  10.9*  --   HCT 35.5*  --   --  35.0*  --  33.9*  --   PLT 168  --   --  173  --  172  --   HEPARINUNFRC  --   --    < > 1.03* 1.26* 0.75* 0.40  CREATININE 1.01*  --   --  1.07*  --   --   --   TROPONINIHS 267* 965*  --  2,344*  --   --   --    < > = values in this interval not displayed.    Estimated Creatinine Clearance: 55.4 mL/min (A) (by C-G formula based on SCr of 1.07 mg/dL (H)).  Assessment: 30 YOF presented with chest discomfort and Pharmacy consulted to dose IV heparin for ACS.  Patient's d-dimer is also elevated and CTA ordered.  Baseline labs and home meds reviewed.  Heparin level is 0.4, on 600 units/hr. Hgb 10.9, plt 172. No s/sx of bleeding or infusion issues.   Goal of Therapy:  Heparin level 0.3-0.7 units/ml Monitor platelets by anticoagulation protocol: Yes   Plan:  Continue heparin at 600 units/hr.  Recheck 8 hr heparin level Daily heparin level and CBC F/u plans for oral anticoagulation eventually.  Thanks for allowing pharmacy to be a part of this patient's care.  Antonietta Jewel, PharmD, BCCCP Clinical Pharmacist  Phone: [phone]  Please check AMION for all [place] phone numbers After 10:00 PM, call [place] [phone]  07/14/2019 1:13 PM

## 2019-07-14 NOTE — Progress Notes (Signed)
Bilateral lower extremity venous duplex has been completed. Preliminary results can be found in CV Proc through chart review.   07/14/19 1:06 PM Carlos Levering RVT

## 2019-07-14 NOTE — Care Management (Signed)
Per Reather Laurence with [employer] Co-pay amount for Xarelto 5 mg.$45.00 for 30 day supply.(mail order for 90 day supply $125.00). Eliquis 2.5 mg and 67m.bid $45.00 ( Mail order90 day supply $125.00)  No PA required Deductible has been met. teir 3 medication Retail Pharmacy are: [facility], [facility].

## 2019-07-14 NOTE — Progress Notes (Addendum)
PROGRESS NOTE    Karen Owens  [id number] DOB: 1941-03-23 DOA: 07/12/2019 PCP: Harlan Stains, MD   Brief Narrative:  Patient is a 78 year old female with history of asthma, multinodular goiter, history of DVT currently not on anticoagulation who presented with chest pain from home.  She was complaining of substernal chest pressure with radiation to the back.  In the emergency department she was found to be tachycardic, hypoxic on room air at 88%.  D-dimer was elevated.  Elevated troponin.  CT angiogram showed pulmonary embolism.  Started on heparin drip.  Due to size of the embolus, PCCM also consulted.She is a Occupational psychologist & Plan:   Principal Problem:   Acute pulmonary embolism (HCC) Active Problems:   Anxiety   Chest pain   Asthma   Elevated troponin   Large saddle  PE with right heart strain: CT angiogram showed large saddle PE at bifurcation of left PA extending into left upper/left lower lobe pulmonary embolism with evidence of right heart strain. Also noted large embolus at bifurcation of right PE extending into the upper lobe, descending interlobar pulmonary artery and right lower lobe.  RV/LV ratio 1.43 She has been started  on heparin drip.  Currently denies any chest pain or shortness of breath. Echocardiogram showed ejection fraction of 60-65%, right ventricular volume overload, right ventricular size moderately enlarged, left ventricular hypertrophy.  She has significantly elevated troponin that is from supply demand ischemia. She is hemodynamically stable so no need for lytic therapy or EKOS. She needs to be switched to oral anticoagulation at some point.  She has history of DVT about a year ago and was on Eliquis but she had GI bleeding so it was stopped. She never underwent any evaluation by GI at that point. I have discussed with Dr. Paulita Fujita, gastroenterology ,who will see the patient for possible EGD. Continue PPI She will follow-up with pulmonology as  an outpatient , will undergo follow-up echocardiogram.  Hypertension: Currently blood stable.  Continue to monitor.  On metoprolol at home.  Hyperlipidemia:Started on lipitor 20 mg daily.  Obesity: BMI of 37           DVT prophylaxis:Heparin [place] Code Status: Full Family Communication: None present at the bedside Disposition Plan: Home after full work-up   Consultants: GI, PCCM  Procedures: None  Antimicrobials: None Anti-infectives (From admission, onward)   None      Subjective:  Patient seen and examined the bedside this morning.  Sitting on the chair.  Comfortable.  Hemodynamically stable.  Denies any chest pain or shortness of breath.  Objective: Vitals:   07/13/19 2353 07/14/19 0354 07/14/19 0500 07/14/19 0722  BP: 121/84 135/88  (!) 145/86  Pulse: 78 80  69  Resp: (!) 23 16  18   Temp: 98.6 F (37 C) 98.5 F (36.9 C)  98.9 F (37.2 C)  TempSrc: Oral Oral  Oral  SpO2: 94% 98%  97%  Weight:   107.3 kg   Height:        Intake/Output Summary (Last 24 hours) at 07/14/2019 1050 Last data filed at 07/14/2019 [id number] Gross per 24 hour  Intake 1277.62 ml  Output --  Net 1277.62 ml   Filed Weights   07/12/19 1605 07/13/19 0034 07/14/19 0500  Weight: 106.6 kg 106.1 kg 107.3 kg    Examination:  General exam: Appears calm and comfortable ,Not in distress, obese HEENT:PERRL,Oral mucosa moist, Ear/Nose normal on gross exam Respiratory system: Bilateral equal air entry, normal vesicular breath  sounds, no wheezes or crackles  Cardiovascular system: S1 & S2 heard, RRR. No JVD, murmurs, rubs, gallops or clicks. No pedal edema. Gastrointestinal system: Abdomen is nondistended, soft and nontender. No organomegaly or masses felt. Normal bowel sounds heard. Central nervous system: Alert and oriented. No focal neurological deficits. Extremities: No edema, no clubbing ,no cyanosis, distal peripheral pulses palpable. Skin: No rashes, lesions or ulcers,no icterus ,no  pallor    Data Reviewed: I have personally reviewed following labs and imaging studies  CBC: Recent Labs  Lab 07/12/19 1721 07/13/19 0745 07/14/19 0227  WBC 12.6* 9.3 8.9  NEUTROABS 10.7*  --   --   HGB 11.6* 11.6* 10.9*  HCT 35.5* 35.0* 33.9*  MCV 98.3 96.7 98.3  PLT 168 173 [id number]   Basic Metabolic Panel: Recent Labs  Lab 07/12/19 1721 07/13/19 0745  NA 140 138  K 3.6 4.5  CL 109 109  CO2 22 20*  GLUCOSE 135* 113*  BUN 20 22  CREATININE 1.01* 1.07*  CALCIUM 9.8 9.8   GFR: Estimated Creatinine Clearance: 55.4 mL/min (A) (by C-G formula based on SCr of 1.07 mg/dL (H)). Liver Function Tests: Recent Labs  Lab 07/12/19 1721 07/13/19 0745  AST 26 36  ALT 20 31  ALKPHOS 73 77  BILITOT 0.5 1.1  PROT 6.3* 6.4*  ALBUMIN 3.5 3.3*   No results for input(s): LIPASE, AMYLASE in the last 168 hours. No results for input(s): AMMONIA in the last 168 hours. Coagulation Profile: No results for input(s): INR, PROTIME in the last 168 hours. Cardiac Enzymes: No results for input(s): CKTOTAL, CKMB, CKMBINDEX, TROPONINI in the last 168 hours. BNP (last 3 results) No results for input(s): PROBNP in the last 8760 hours. HbA1C: Recent Labs    07/13/19 0745  HGBA1C 5.4   CBG: No results for input(s): GLUCAP in the last 168 hours. Lipid Profile: Recent Labs    07/13/19 0745  CHOL 198  HDL 55  LDLCALC 137*  TRIG 29  CHOLHDL 3.6   Thyroid Function Tests: No results for input(s): TSH, T4TOTAL, FREET4, T3FREE, THYROIDAB in the last 72 hours. Anemia Panel: No results for input(s): VITAMINB12, FOLATE, FERRITIN, TIBC, IRON, RETICCTPCT in the last 72 hours. Sepsis Labs: No results for input(s): PROCALCITON, LATICACIDVEN in the last 168 hours.  Recent Results (from the past 240 hour(s))  SARS CORONAVIRUS 2 (TAT 6-24 HRS) Nasopharyngeal Nasopharyngeal Swab     Status: None   Collection Time: 07/12/19 11:46 PM   Specimen: Nasopharyngeal Swab  Result Value Ref Range Status    SARS Coronavirus 2 NEGATIVE NEGATIVE Final    Comment: (NOTE) SARS-CoV-2 target nucleic acids are NOT DETECTED. The SARS-CoV-2 RNA is generally detectable in upper and lower respiratory specimens during the acute phase of infection. Negative results do not preclude SARS-CoV-2 infection, do not rule out co-infections with other pathogens, and should not be used as the sole basis for treatment or other patient management decisions. Negative results must be combined with clinical observations, patient history, and epidemiological information. The expected result is Negative. Fact Sheet for Patients: SugarRoll.be Fact Sheet for Healthcare Providers: https://www.woods-mathews.com/ This test is not yet approved or cleared by the [place] FDA and  has been authorized for detection and/or diagnosis of SARS-CoV-2 by FDA under an Emergency Use Authorization (EUA). This EUA will remain  in effect (meaning this test can be used) for the duration of the COVID-19 declaration under Section 56 4(b)(1) of the Act, 21 U.S.C. section 360bbb-3(b)(1), unless the authorization is  terminated or revoked sooner. Performed at [facility], [place] [address], [place], [place] [address]   MRSA PCR Screening     Status: None   Collection Time: 07/13/19 12:59 AM   Specimen: Nasal Mucosa; Nasopharyngeal  Result Value Ref Range Status   MRSA by PCR NEGATIVE NEGATIVE Final    Comment:        The GeneXpert MRSA Assay (FDA approved for NASAL specimens only), is one component of a comprehensive MRSA colonization surveillance program. It is not intended to diagnose MRSA infection nor to guide or monitor treatment for MRSA infections. Performed at [facility], [place] [address], [place], [place] [address]          Radiology Studies: Ct Angio Chest Pe W And/or Wo Contrast  Result Date: 07/12/2019 CLINICAL DATA:  Substernal chest pain, nausea, weakness,  coronary artery disease, asthma EXAM: CT ANGIOGRAPHY CHEST WITH CONTRAST TECHNIQUE: Multidetector CT imaging of the chest was performed using the standard protocol during bolus administration of intravenous contrast. Multiplanar CT image reconstructions and MIPs were obtained to evaluate the vascular anatomy. CONTRAST:  26mL OMNIPAQUE IOHEXOL 350 MG/ML SOLN IV COMPARISON:  04/27/2019 FINDINGS: Cardiovascular: Aorta normal caliber without aneurysm or dissection. No pericardial effusion. Multiple pulmonary emboli are identified. Large saddle embolus at bifurcation of LEFT pulmonary artery extending into LEFT upper and LEFT lower lobes. LEFT upper lobe thrombus appears occlusive. Large embolus identified at bifurcation of RIGHT pulmonary artery extending into upper lobe, descending interlobar pulmonary artery and RIGHT lower lobe. Additional segmental embolus in RIGHT middle lobe. Elevated RV/LV ratio of 1.43 indicating RIGHT heart strain and at least submassive pulmonary embolism. Mediastinum/Nodes: Esophagus unremarkable. Diffusely enlarged multinodular thyroid gland with mild transverse narrowing of the trachea. No thoracic adenopathy. Lungs/Pleura: Minimal subsegmental atelectasis at lung bases greater on RIGHT. Remaining lungs clear. No pleural effusion or pneumothorax. Upper Abdomen: No abnormalities identified Musculoskeletal: Unremarkable Review of the MIP images confirms the above findings. IMPRESSION: Positive exam for presence of large BILATERAL pulmonary emboli. Large saddle embolus at bifurcation of LEFT pulmonary artery extending into LEFT upper and LEFT lower lobes, with occlusive embolus in the LEFT upper lobe. Additional segmental embolus in RIGHT middle lobe. Positive for acute PE with CT evidence of right heart strain (RV/LV Ratio = 1.43) consistent with at least submassive (intermediate risk) PE. The presence of right heart strain has been associated with an increased risk of morbidity and  mortality. Please activate Code PE by paging [phone]. Enlarged multinodular thyroid gland; follow-up non emergent thyroid ultrasound recommended. Critical Value/emergent results were called by telephone at the time of interpretation on 07/12/2019 at 2212 hrs to provider DR. Maudie Mercury, who verbally acknowledged these results. Electronically Signed   By: Lavonia Dana M.D.   On: 07/12/2019 22:13   Dg Chest Portable 1 View  Result Date: 07/12/2019 CLINICAL DATA:  Pt c/o central CP, SOB, nausea, and weakness. Pt hx CAD, asthma, arthritis. Pt is a former smoker, quit in the 80's. EXAM: PORTABLE CHEST 1 VIEW COMPARISON:  04/26/2019 FINDINGS: Borderline enlargement of the cardiopericardial silhouette. This is stable. No mediastinal or hilar masses. Clear lungs.  No pleural effusion or pneumothorax. Skeletal structures are grossly intact. IMPRESSION: No active disease. Electronically Signed   By: Lajean Manes M.D.   On: 07/12/2019 16:45        Scheduled Meds:  atorvastatin  20 mg Oral q1800   pantoprazole (PROTONIX) IV  40 mg Intravenous Q24H   polyvinyl alcohol  1 drop  Both Eyes BID   Continuous Infusions:  heparin 600 Units/hr (07/14/19 0353)     LOS: 2 days    Time spent:35 mins. More than 50% of that time was spent in counseling and/or coordination of care.      Shelly Coss, MD [facility] Pager [phone]  If 7PM-7AM, please contact night-coverage [email or web address] Password [email or web address] 07/14/2019, 10:50 AM

## 2019-07-14 NOTE — Progress Notes (Signed)
ANTICOAGULATION CONSULT NOTE  Pharmacy Consult:  Heparin Indication: chest pain/ACS  Allergies  Allergen Reactions  . Tuberculin Tests Hives  . Other Other (See Comments)    Jehovah's Witness- NO BLOOD PRODUCTS!!    Patient Measurements: Height: 5\' 6"  (167.6 cm) Weight: 233 lb 14.5 oz (106.1 kg) IBW/kg (Calculated) : 59.3 Heparin Dosing Weight: 88 kg  Vital Signs: Temp: 98.6 F (37 C) (11/02 2353) Temp Source: Oral (11/02 2353) BP: 121/84 (11/02 2353) Pulse Rate: 78 (11/02 2353)  Labs: Recent Labs    07/12/19 1721 07/12/19 2027 07/13/19 0745 07/13/19 1825 07/14/19 0227  HGB 11.6*  --  11.6*  --  10.9*  HCT 35.5*  --  35.0*  --  33.9*  PLT 168  --  173  --  172  HEPARINUNFRC  --   --  1.03* 1.26* 0.75*  CREATININE 1.01*  --  1.07*  --   --   TROPONINIHS 267* 965* 2,344*  --   --     Estimated Creatinine Clearance: 55.1 mL/min (A) (by C-G formula based on SCr of 1.07 mg/dL (H)).  Assessment: 18 YOF presented with chest discomfort and Pharmacy consulted to dose IV heparin for ACS.  Patient's d-dimer is also elevated and CTA ordered.  Baseline labs and home meds reviewed.  Heparin level remains slightly elevated this am, 0.75 units/ml Goal of Therapy:  Heparin level 0.3-0.7 units/ml Monitor platelets by anticoagulation protocol: Yes   Plan:  Reduce heparin to 600 units/hr.  Recheck 8 hr heparin level Daily heparin level and CBC F/u plans for oral anticoagulation eventually.  Thanks for allowing pharmacy to be a part of this patient's care.  Excell Seltzer, PharmD Clinical Pharmacist  07/14/2019 3:52 AM

## 2019-07-14 NOTE — Plan of Care (Signed)
  Problem: Health Behavior/Discharge Planning: Goal: Ability to manage health-related needs will improve Outcome: Progressing   Problem: Clinical Measurements: Goal: Ability to maintain clinical measurements within normal limits will improve Outcome: Progressing Goal: Will remain free from infection Outcome: Progressing Goal: Respiratory complications will improve Outcome: Progressing Goal: Cardiovascular complication will be avoided Outcome: Progressing   Problem: Activity: Goal: Risk for activity intolerance will decrease Outcome: Progressing   Problem: Nutrition: Goal: Adequate nutrition will be maintained Outcome: Progressing   

## 2019-07-14 NOTE — Consult Note (Signed)
Referring Provider:  [place] Primary Care Physician:  Harlan Stains, MD Primary Gastroenterologist:  Dr. Michail Sermon  Reason for Consultation: History of GI bleed now in need of anticoagulation  HPI: Karen Owens is a 78 y.o. female with past medical history of DVT diagnosed last year, history of Prinzmetal's angina presented to the hospital on July 12, 2019 with chest pain.  Subsequently diagnosed with large bilateral pulmonary embolism.  GI is consulted given her history of GI bleed last year when she was placed on Eliquis after her diagnosis of DVT.  Patient seen and examined at bedside.  According to her, she was placed on aspirin and Eliquis after being diagnosed with DVT last year.  Following that she developed melena and subsequently anticoagulation and aspirin was stopped.  She has not developed any further GI bleed since then.  She denies any abdominal pain, vomiting, dysphagia or odynophagia.  Complaining of nausea since last 2 days.  Denies any diarrhea or constipation.  Last colonoscopy in October 2009 by Dr. Michail Sermon showed 15 mm polypoid lesion in the ascending colon.  It was removed with a snare and pathology showed lipoma.  Repeat colonoscopy was recommended in 5 years but it was not done because of patient's knee surgery.  FIT negative in 03/ 2017  Blood work at PCPs office on July 07, 2019 showed normal hemoglobin.  Past Medical History:  Diagnosis Date  . Arthritis   . Asthma   . Complication of anesthesia    difficulty waking up  . DVT (deep venous thrombosis) ([place])   . Refusal of blood transfusions as patient is Jehovah's Witness   . Thyroid nodule     Past Surgical History:  Procedure Laterality Date  . DILATION AND CURETTAGE OF UTERUS    . LEFT HEART CATH AND CORONARY ANGIOGRAPHY N/A 06/09/2018   Procedure: LEFT HEART CATH AND CORONARY ANGIOGRAPHY;  Surgeon: Lorretta Harp, MD;  Location: [facility];  Service: Cardiovascular;  Laterality: N/A;  .  LIPOMA EXCISION     right arm  . TOTAL KNEE ARTHROPLASTY     left  . TOTAL KNEE ARTHROPLASTY  08/20/2012   right knee  . TOTAL KNEE ARTHROPLASTY  08/20/2012   Procedure: TOTAL KNEE ARTHROPLASTY;  Surgeon: Ninetta Lights, MD;  Location: [facility];  Service: Orthopedics;  Laterality: Right;    Prior to Admission medications   Medication Sig Start Date End Date Taking? Authorizing Provider  APPLE CIDER VINEGAR PO Take 250 mg by mouth daily.   Yes [provider]  carboxymethylcellulose (REFRESH TEARS) 0.5 % SOLN Place 1 drop into both eyes 2 (two) times daily.    Yes [provider]  ibuprofen (ADVIL,MOTRIN) 800 MG tablet Take 800 mg by mouth every 8 (eight) hours as needed (inflammation/pain).  05/05/18  Yes [provider]  metoprolol tartrate (LOPRESSOR) 25 MG tablet Take 12.5 mg by mouth 2 (two) times daily. 07/02/19  Yes [provider]  nitroGLYCERIN (NITROSTAT) 0.4 MG SL tablet Place 1 tablet (0.4 mg total) under the tongue every 5 (five) minutes x 3 doses as needed. Patient taking differently: Place 0.4 mg under the tongue every 5 (five) minutes x 3 doses as needed for chest pain.  07/09/18  Yes Lorretta Harp, MD  Nutritional Supplements (IMMUNOCAL PO) Take 1 packet by mouth daily. Mix in juice and drink   Yes [provider]    Scheduled Meds: . atorvastatin  20 mg Oral q1800  . pantoprazole (PROTONIX) IV  40 mg Intravenous Q24H  . polyvinyl alcohol  1 drop Both Eyes BID   Continuous Infusions: . heparin 600 Units/hr (07/14/19 0353)   PRN Meds:.acetaminophen **OR** acetaminophen  Allergies as of 07/12/2019 - Review Complete 07/12/2019  Allergen Reaction Noted  . Tuberculin tests Hives 06/06/2018  . Other Other (See Comments) 04/26/2019    Family History  Problem Relation Age of Onset  . Heart attack Brother     Social History   Socioeconomic History  . Marital status: Divorced    Spouse name: Not on file  . Number of  children: Not on file  . Years of education: Not on file  . Highest education level: Not on file  Occupational History  . Not on file  Social Needs  . Financial resource strain: Not on file  . Food insecurity    Worry: Not on file    Inability: Not on file  . Transportation needs    Medical: Not on file    Non-medical: Not on file  Tobacco Use  . Smoking status: Former Smoker    Quit date: 09/10/1982    Years since quitting: 36.8  . Smokeless tobacco: Never Used  Substance and Sexual Activity  . Alcohol use: Yes    Comment: occasional  . Drug use: No  . Sexual activity: Never    Birth control/protection: Post-menopausal  Lifestyle  . Physical activity    Days per week: Not on file    Minutes per session: Not on file  . Stress: Not on file  Relationships  . Social Herbalist on phone: Not on file    Gets together: Not on file    Attends religious service: Not on file    Active member of club or organization: Not on file    Attends meetings of clubs or organizations: Not on file    Relationship status: Not on file  . Intimate partner violence    Fear of current or ex partner: Not on file    Emotionally abused: Not on file    Physically abused: Not on file    Forced sexual activity: Not on file  Other Topics Concern  . Not on file  Social History Narrative  . Not on file    Review of Systems: Review of Systems  Constitutional: Negative for chills and fever.  HENT: Negative for hearing loss and tinnitus.   Eyes: Negative for blurred vision and double vision.  Respiratory: Positive for shortness of breath. Negative for cough.   Cardiovascular: Positive for chest pain. Negative for palpitations.  Gastrointestinal: Positive for nausea. Negative for abdominal pain, blood in stool, heartburn, melena and vomiting.  Genitourinary: Negative for dysuria and urgency.  Musculoskeletal: Negative for myalgias and neck pain.  Skin: Negative for itching and rash.   Neurological: Negative for seizures and loss of consciousness.  Endo/Heme/Allergies: Does not bruise/bleed easily.  Psychiatric/Behavioral: Negative for hallucinations and substance abuse.    Physical Exam: Vital signs: Vitals:   07/14/19 0354 07/14/19 0722  BP: 135/88 (!) 145/86  Pulse: 80 69  Resp: 16 18  Temp: 98.5 F (36.9 C) 98.9 F (37.2 C)  SpO2: 98% 97%   Last BM Date: 07/12/19 Physical Exam  Constitutional: She is oriented to person, place, and time. She appears well-developed and well-nourished.  HENT:  Head: Atraumatic.  Mouth/Throat: No oropharyngeal exudate.  Eyes: EOM are normal. No scleral icterus.  Neck: Normal range of motion. Neck supple.  Cardiovascular: Normal rate and regular  rhythm.  Pulmonary/Chest: Effort normal. No respiratory distress.  Abdominal: Soft. Bowel sounds are normal. She exhibits no distension. There is no abdominal tenderness. There is no rebound and no guarding.  Musculoskeletal: Normal range of motion.        General: No edema.  Neurological: She is alert and oriented to person, place, and time.  Skin: Skin is warm. No erythema.  Psychiatric: She has a normal mood and affect. Judgment and thought content normal.  Vitals reviewed.  GI:  Lab Results: Recent Labs    07/12/19 1721 07/13/19 0745 07/14/19 0227  WBC 12.6* 9.3 8.9  HGB 11.6* 11.6* 10.9*  HCT 35.5* 35.0* 33.9*  PLT 168 173 172   BMET Recent Labs    07/12/19 1721 07/13/19 0745  NA 140 138  K 3.6 4.5  CL 109 109  CO2 22 20*  GLUCOSE 135* 113*  BUN 20 22  CREATININE 1.01* 1.07*  CALCIUM 9.8 9.8   LFT Recent Labs    07/13/19 0745  PROT 6.4*  ALBUMIN 3.3*  AST 36  ALT 31  ALKPHOS 77  BILITOT 1.1   PT/INR No results for input(s): LABPROT, INR in the last 72 hours.   Studies/Results: Ct Angio Chest Pe W And/or Wo Contrast  Result Date: 07/12/2019 CLINICAL DATA:  Substernal chest pain, nausea, weakness, coronary artery disease, asthma EXAM: CT  ANGIOGRAPHY CHEST WITH CONTRAST TECHNIQUE: Multidetector CT imaging of the chest was performed using the standard protocol during bolus administration of intravenous contrast. Multiplanar CT image reconstructions and MIPs were obtained to evaluate the vascular anatomy. CONTRAST:  81mL OMNIPAQUE IOHEXOL 350 MG/ML SOLN IV COMPARISON:  04/27/2019 FINDINGS: Cardiovascular: Aorta normal caliber without aneurysm or dissection. No pericardial effusion. Multiple pulmonary emboli are identified. Large saddle embolus at bifurcation of LEFT pulmonary artery extending into LEFT upper and LEFT lower lobes. LEFT upper lobe thrombus appears occlusive. Large embolus identified at bifurcation of RIGHT pulmonary artery extending into upper lobe, descending interlobar pulmonary artery and RIGHT lower lobe. Additional segmental embolus in RIGHT middle lobe. Elevated RV/LV ratio of 1.43 indicating RIGHT heart strain and at least submassive pulmonary embolism. Mediastinum/Nodes: Esophagus unremarkable. Diffusely enlarged multinodular thyroid gland with mild transverse narrowing of the trachea. No thoracic adenopathy. Lungs/Pleura: Minimal subsegmental atelectasis at lung bases greater on RIGHT. Remaining lungs clear. No pleural effusion or pneumothorax. Upper Abdomen: No abnormalities identified Musculoskeletal: Unremarkable Review of the MIP images confirms the above findings. IMPRESSION: Positive exam for presence of large BILATERAL pulmonary emboli. Large saddle embolus at bifurcation of LEFT pulmonary artery extending into LEFT upper and LEFT lower lobes, with occlusive embolus in the LEFT upper lobe. Additional segmental embolus in RIGHT middle lobe. Positive for acute PE with CT evidence of right heart strain (RV/LV Ratio = 1.43) consistent with at least submassive (intermediate risk) PE. The presence of right heart strain has been associated with an increased risk of morbidity and mortality. Please activate Code PE by paging  [phone]. Enlarged multinodular thyroid gland; follow-up non emergent thyroid ultrasound recommended. Critical Value/emergent results were called by telephone at the time of interpretation on 07/12/2019 at 2212 hrs to provider DR. Maudie Mercury, who verbally acknowledged these results. Electronically Signed   By: Lavonia Dana M.D.   On: 07/12/2019 22:13   Dg Chest Portable 1 View  Result Date: 07/12/2019 CLINICAL DATA:  Pt c/o central CP, SOB, nausea, and weakness. Pt hx CAD, asthma, arthritis. Pt is a former smoker, quit in the 80's. EXAM: PORTABLE CHEST 1 VIEW COMPARISON:  04/26/2019 FINDINGS: Borderline enlargement of the cardiopericardial silhouette. This is stable. No mediastinal or hilar masses. Clear lungs.  No pleural effusion or pneumothorax. Skeletal structures are grossly intact. IMPRESSION: No active disease. Electronically Signed   By: Lajean Manes M.D.   On: 07/12/2019 16:45    Impression/Plan: -History of GI bleed with melena 1 year ago.  No further bleeding at this time. -Acute bilateral large pulmonary embolism.  Currently on heparin drip. -Anemia.  Mild drop in hemoglobin could be dilutional.  Recommendations ------------------------- -Patient does not have any active bleeding despite of being on heparin drip.  She denies any bleeding in last several months.  She had few episodes of melena last year after being placed on Eliquis and aspirin which has resolved now.  -Offered EGD for further evaluation but patient declined at this time.  She is willing to get upper GI series to rule out any gross lesions.  -If upper GI series negative, okay to start oral anticoagulation.  Recommend to continue Protonix 40 mg once a day while on anticoagulation for at least 3 months.  -Recommend outpatient EGD and colonoscopy once isafe to hold her anticoagulation.  She is overdue for her surveillance colonoscopy.  -Okay to start heart healthy diet after upper GI series.    LOS: 2 days   Otis Brace  MD, FACP 07/14/2019, 7:57 AM  Contact #  [phone]

## 2019-07-15 LAB — CBC
HCT: 34.6 % — ABNORMAL LOW (ref 36.0–46.0)
Hemoglobin: 11.4 g/dL — ABNORMAL LOW (ref 12.0–15.0)
MCH: 31.8 pg (ref 26.0–34.0)
MCHC: 32.9 g/dL (ref 30.0–36.0)
MCV: 96.6 fL (ref 80.0–100.0)
Platelets: 183 10*3/uL (ref 150–400)
RBC: 3.58 MIL/uL — ABNORMAL LOW (ref 3.87–5.11)
RDW: 12.6 % (ref 11.5–15.5)
WBC: 8.6 10*3/uL (ref 4.0–10.5)
nRBC: 0 % (ref 0.0–0.2)

## 2019-07-15 LAB — HEPARIN LEVEL (UNFRACTIONATED): Heparin Unfractionated: 0.22 IU/mL — ABNORMAL LOW (ref 0.30–0.70)

## 2019-07-15 MED ORDER — APIXABAN 5 MG PO TABS: 10.0000 mg | ORAL_TABLET | Freq: Two times a day (BID) | ORAL | 0 refills | Status: DC

## 2019-07-15 MED ORDER — PANTOPRAZOLE SODIUM 40 MG PO TBEC: 40.0000 mg | DELAYED_RELEASE_TABLET | Freq: Every day | ORAL | 2 refills | Status: DC

## 2019-07-15 MED ORDER — APIXABAN 5 MG PO TABS: 5.0000 mg | ORAL_TABLET | Freq: Two times a day (BID) | ORAL | Status: DC

## 2019-07-15 MED ORDER — APIXABAN 5 MG PO TABS
10.0000 mg | ORAL_TABLET | Freq: Two times a day (BID) | ORAL | Status: DC
  Administered 2019-07-15: 09:00:00 10 mg via ORAL
  Filled 2019-07-15: qty 2

## 2019-07-15 MED ORDER — ATORVASTATIN CALCIUM 20 MG PO TABS: 20.0000 mg | ORAL_TABLET | Freq: Every day | ORAL | 0 refills | Status: DC

## 2019-07-15 MED ORDER — APIXABAN 5 MG PO TABS: 5.0000 mg | ORAL_TABLET | Freq: Two times a day (BID) | ORAL | 0 refills | Status: AC

## 2019-07-15 NOTE — TOC Transition Note (Addendum)
Transition of Care [facility]) - CM/SW Discharge Note   Patient Details  Name: Karen Owens MRN: [id number] Date of Birth: [birth year]  [facility]) CM/SW Contact:  Zenon Mayo, RN Phone Number: 07/15/2019, 11:24 AM   Clinical Narrative:    Patient for dc today, she states she will do exercise at home her daughter has a treadmill at the house, she does not want to do outpt pt.  She would like for the physical therapist that worked with her yesterday to come down and talk with her about what exercises to do.  NCM paged him and he states he will be down to speak with patient.  NCM gave patient the 30 day elqius coupon to take to pharmacy.   NCM informed patient of the co pay amt for her refills. Meds has already been filled at [facility].   Final next level of care: Home/Self Care Barriers to Discharge: No Barriers Identified   Patient Goals and CMS Choice Patient states their goals for this hospitalization and ongoing recovery are:: to do what ever that want her to do as far as exercising and taking her meds   Choice offered to / list presented to : NA  Discharge Placement                       Discharge Plan and Services                          HH Arranged: NA          Social Determinants of Health (SDOH) Interventions     Readmission Risk Interventions No flowsheet data found.

## 2019-07-15 NOTE — Progress Notes (Signed)
ANTICOAGULATION CONSULT NOTE - Follow Up Consult  Pharmacy Consult for heparin>>apixaban Indication: pulmonary embolus  Labs: Recent Labs    07/12/19 1721 07/12/19 2027 07/13/19 0745  07/14/19 0227 07/14/19 1200 07/14/19 1956 07/15/19 0209  HGB 11.6*  --  11.6*  --  10.9*  --   --  11.4*  HCT 35.5*  --  35.0*  --  33.9*  --   --  34.6*  PLT 168  --  173  --  172  --   --  183  HEPARINUNFRC  --   --  1.03*   < > 0.75* 0.40 0.19* 0.22*  CREATININE 1.01*  --  1.07*  --   --   --   --   --   TROPONINIHS 267* 965* 2,344*  --   --   --   --   --    < > = values in this interval not displayed.    Assessment: 78yo female subtherapeutic on heparin this morning, rate adjusted. New orders received to change to apixaban.   Goal of Therapy:  Therapeutic anticoagulation   Plan:  Apixaban 10mg  twice daily for 7 days then 5mg  twice daily  Erin Hearing PharmD., BCPS Clinical Pharmacist 07/15/2019 7:32 AM

## 2019-07-15 NOTE — Progress Notes (Signed)
ANTICOAGULATION CONSULT NOTE - Follow Up Consult  Pharmacy Consult for heparin Indication: pulmonary embolus  Labs: Recent Labs    07/12/19 1721 07/12/19 2027 07/13/19 0745  07/14/19 0227 07/14/19 1200 07/14/19 1956 07/15/19 0209  HGB 11.6*  --  11.6*  --  10.9*  --   --  11.4*  HCT 35.5*  --  35.0*  --  33.9*  --   --  34.6*  PLT 168  --  173  --  172  --   --  183  HEPARINUNFRC  --   --  1.03*   < > 0.75* 0.40 0.19* 0.22*  CREATININE 1.01*  --  1.07*  --   --   --   --   --   TROPONINIHS 267* 965* 2,344*  --   --   --   --   --    < > = values in this interval not displayed.    Assessment: 78yo female subtherapeutic on heparin with very little change in heparin level after rate change; no gtt issues or signs of bleeding per RN.  Goal of Therapy:  Heparin level 0.3-0.7 units/ml   Plan:  Will increase heparin gtt by 20% to 900 units/hr and check level in 8 hours.    Wynona Neat, PharmD, BCPS  07/15/2019,4:16 AM

## 2019-07-15 NOTE — Progress Notes (Signed)
[facility] Gastroenterology Progress Note  Karen Owens 78 y.o. Sep 06, 1941  CC:   History of GI bleed, pulmonary embolism   Subjective: Patient denies any bleeding episodes.  Denies abdominal pain, nausea and vomiting.  Hemoglobin improved.  ROS : Chest pain resolved.  Denies any shortness of breath   Objective: Vital signs in last 24 hours: Vitals:   07/15/19 0404 07/15/19 0827  BP: 127/73 134/71  Pulse: 64 72  Resp: 18   Temp: 98.8 F (37.1 C) 99 F (37.2 C)  SpO2: 98%     Physical Exam:  General.  Alert/oriented x3.  Not in acute distress Abdomen.  Soft, nontender, nondistended, bowel sounds present.  Lab Results: Recent Labs    07/12/19 1721 07/13/19 0745  NA 140 138  K 3.6 4.5  CL 109 109  CO2 22 20*  GLUCOSE 135* 113*  BUN 20 22  CREATININE 1.01* 1.07*  CALCIUM 9.8 9.8   Recent Labs    07/12/19 1721 07/13/19 0745  AST 26 36  ALT 20 31  ALKPHOS 73 77  BILITOT 0.5 1.1  PROT 6.3* 6.4*  ALBUMIN 3.5 3.3*   Recent Labs    07/12/19 1721  07/14/19 0227 07/15/19 0209  WBC 12.6*   < > 8.9 8.6  NEUTROABS 10.7*  --   --   --   HGB 11.6*   < > 10.9* 11.4*  HCT 35.5*   < > 33.9* 34.6*  MCV 98.3   < > 98.3 96.6  PLT 168   < > 172 183   < > = values in this interval not displayed.   No results for input(s): LABPROT, INR in the last 72 hours.    Assessment/Plan: -History of GI bleed with melena 1 year ago.  No further bleeding at this time. -Acute bilateral large pulmonary embolism.  Currently on heparin drip. -Anemia.  Mild drop in hemoglobin could be dilutional.  Recommendations ------------------------- - Upper GI series negative for any ulcer disease. -Started on oral anticoagulation. -  Recommend to continue Protonix 40 mg once a day while on anticoagulation for at least 3 months.  -Recommend outpatient EGD and colonoscopy once isafe to hold her anticoagulation.  She is overdue for her surveillance colonoscopy.  -GI will sign off.  Call us  back if needed.  Follow-up with Dr. Michail Sermon in 3 months.   Otis Brace MD, [place] 07/15/2019, 9:47 AM  Contact #  [phone]

## 2019-07-15 NOTE — Consult Note (Signed)
   [facility] CM Inpatient Consult   07/15/2019  Karen Owens 17-Jul-1941 [id number]    Patient screened for potential [facility] Care Management serviceswith her [facility] Medicare benefits and for risk of unplanned readmission and hospitalizations.  Chart reviewedand transition of care CM note revealed dispositionplan for patient is return home with daughter's support, but she does not want to do Outpatient PT (will do exercise at home, her daughter has a treadmill at the house).   Per chart review and MD brief narrative on 07/14/19 show as follows:   Patient is a 78 year old female with history of asthma, multinodular goiter, history of DVT currently not on anticoagulation who presented with chest pain from home. CT angiogram showed large saddle PE at bifurcation of left PA extending into left upper/left lower lobe pulmonary embolism with evidence of right heart strain.  Called patient in the room and spoke to patient's RN [name]) who reports that patient "just left". She reports that patient was given an Eliquis coupon. Patient was discharged and will stay with her daughter who is an Therapist, sports. She indicated that patient did not have current needs or barriers identified.   Patient's primary care provider is Dr. Harlan Stains with [facility] [place], listed as providing transition of care follow-up.  Patient will benefit from [place] calls to follow her up after discharge.    For questions and additional information, please contact:  Aviannah Castoro A. Merelin Human, BSN, RN-BC [facility] Liaison Cell: [phone]

## 2019-07-15 NOTE — Progress Notes (Signed)
IV discontinued, monitors removed.  Discharge instructions reviewed including how to take medications and where to pick them up.  Daughter arrived during those instructions.  Patient via Wheelchair to waiting car in stable condition

## 2019-07-15 NOTE — Discharge Summary (Signed)
Physician Discharge Summary  Karen Owens [id number] DOB: 25-Jul-1941 DOA: 07/12/2019  PCP: Harlan Stains, MD  Admit date: 07/12/2019 Discharge date: 07/15/2019  Admitted From: Home Disposition:  Home  Discharge Condition:Stable CODE STATUS:FULL Diet recommendation: Heart Healthy  Brief/Interim Summary: Patient is a 78 year old female with history of asthma, multinodular goiter, history of DVT currently not on anticoagulation who presented with chest pain from home.  She was complaining of substernal chest pressure with radiation to the back.  In the emergency department she was found to be tachycardic, hypoxic on room air at 88%.  D-dimer was elevated.  Elevated troponin.  CT angiogram showed pulmonary embolism.  Started on heparin drip.  Due to size of the embolus, PCCM also consulted.  She remained hemodynamically stable.  She was also evaluated by GI due to history of previous GI bleed while on Eliquis.  She denied EGD/colonoscopy during this hospitalization.  Upper GI series did not show any bleeding.  She has been started on Eliquis.  She is stable for discharge to home today.  Following problems were addressed during her hospitalization:  Large saddle  PE with right heart strain: CT angiogram showed large saddle PE at bifurcation of left PA extending into left upper/left lower lobe pulmonary embolism with evidence of right heart strain. Also noted large embolus at bifurcation of right PE extending into the upper lobe, descending interlobar pulmonary artery and right lower lobe.  RV/LV ratio 1.43 She has been started  on heparin drip.  Currently denies any chest pain or shortness of breath. Echocardiogram showed ejection fraction of 60-65%, right ventricular volume overload, right ventricular size moderately enlarged, left ventricular hypertrophy.  She has significantly elevated troponin that is from supply demand ischemia. She is hemodynamically stable so no need for lytic therapy or  EKOS. She needs to be switched to oral anticoagulation at some point.  She has history of DVT about a year ago and was on Eliquis but she had GI bleeding so it was stopped. She never underwent any evaluation by GI at that point. She was also evaluated by GI due to history of previous GI bleed while on Eliquis.  She denied EGD/colonoscopy during this hospitalization.  Upper GI series did not show any bleeding.  Continue PPI while on Eliquis PCCM was also following her here.She will follow-up with pulmonology as an outpatient , will undergo follow-up echocardiogram.  Hypertension: Currently blood stable.  Continue to monitor.  On metoprolol at home.  Hyperlipidemia:Started on lipitor 20 mg daily.  Obesity: BMI of 37  Discharge Diagnoses:  Principal Problem:   Acute pulmonary embolism (HCC) Active Problems:   Anxiety   Chest pain   Asthma   Elevated troponin    Discharge Instructions  Discharge Instructions    Diet - low sodium heart healthy   Complete by: As directed    Discharge instructions   Complete by: As directed    1)Please follow up with your PCP in a week. 2)Take prescribed medications as instructed 3)Follow up with gastroenterology in 3 months.   Increase activity slowly   Complete by: As directed      Allergies as of 07/15/2019      Reactions   Tuberculin Tests Hives   Other Other (See Comments)   J[employer] Witness- NO BLOOD PRODUCTS!!      Medication List    TAKE these medications   apixaban 5 MG Tabs tablet Commonly known as: ELIQUIS Take 2 tablets (10 mg total) by mouth 2 (two) times  daily for 7 days.   apixaban 5 MG Tabs tablet Commonly known as: ELIQUIS Take 1 tablet (5 mg total) by mouth 2 (two) times daily. Start taking on: July 22, 2019   APPLE CIDER VINEGAR PO Take 250 mg by mouth daily.   atorvastatin 20 MG tablet Commonly known as: LIPITOR Take 1 tablet (20 mg total) by mouth daily at 6 PM.   ibuprofen 800 MG tablet Commonly  known as: ADVIL Take 800 mg by mouth every 8 (eight) hours as needed (inflammation/pain).   IMMUNOCAL PO Take 1 packet by mouth daily. Mix in juice and drink   metoprolol tartrate 25 MG tablet Commonly known as: LOPRESSOR Take 12.5 mg by mouth 2 (two) times daily.   nitroGLYCERIN 0.4 MG SL tablet Commonly known as: NITROSTAT Place 1 tablet (0.4 mg total) under the tongue every 5 (five) minutes x 3 doses as needed. What changed: reasons to take this   pantoprazole 40 MG tablet Commonly known as: Protonix Take 1 tablet (40 mg total) by mouth daily.   Refresh Tears 0.5 % Soln Generic drug: carboxymethylcellulose Place 1 drop into both eyes 2 (two) times daily.      Follow-up Information    Wilford Corner, MD. Schedule an appointment as soon as possible for a visit in 3 month(s).   Specialty: Gastroenterology Why: Needs outpatient EGD and colonoscopy Contact information: [address] [place] [address] [phone]        Harlan Stains, MD. Schedule an appointment as soon as possible for a visit in 1 week(s).   Specialty: Family Medicine Contact information: [place] [address] [phone]          Allergies  Allergen Reactions  . Tuberculin Tests Hives  . Other Other (See Comments)    Jeh[employer] Witness- NO BLOOD PRODUCTS!!    Consultations:  GI,PCCM   Procedures/Studies: Ct Angio Chest Pe W And/or Wo Contrast  Result Date: 07/12/2019 CLINICAL DATA:  Substernal chest pain, nausea, weakness, coronary artery disease, asthma EXAM: CT ANGIOGRAPHY CHEST WITH CONTRAST TECHNIQUE: Multidetector CT imaging of the chest was performed using the standard protocol during bolus administration of intravenous contrast. Multiplanar CT image reconstructions and MIPs were obtained to evaluate the vascular anatomy. CONTRAST:  76mL OMNIPAQUE IOHEXOL 350 MG/ML SOLN IV COMPARISON:  04/27/2019 FINDINGS: Cardiovascular: Aorta  normal caliber without aneurysm or dissection. No pericardial effusion. Multiple pulmonary emboli are identified. Large saddle embolus at bifurcation of LEFT pulmonary artery extending into LEFT upper and LEFT lower lobes. LEFT upper lobe thrombus appears occlusive. Large embolus identified at bifurcation of RIGHT pulmonary artery extending into upper lobe, descending interlobar pulmonary artery and RIGHT lower lobe. Additional segmental embolus in RIGHT middle lobe. Elevated RV/LV ratio of 1.43 indicating RIGHT heart strain and at least submassive pulmonary embolism. Mediastinum/Nodes: Esophagus unremarkable. Diffusely enlarged multinodular thyroid gland with mild transverse narrowing of the trachea. No thoracic adenopathy. Lungs/Pleura: Minimal subsegmental atelectasis at lung bases greater on RIGHT. Remaining lungs clear. No pleural effusion or pneumothorax. Upper Abdomen: No abnormalities identified Musculoskeletal: Unremarkable Review of the MIP images confirms the above findings. IMPRESSION: Positive exam for presence of large BILATERAL pulmonary emboli. Large saddle embolus at bifurcation of LEFT pulmonary artery extending into LEFT upper and LEFT lower lobes, with occlusive embolus in the LEFT upper lobe. Additional segmental embolus in RIGHT middle lobe. Positive for acute PE with CT evidence of right heart strain (RV/LV Ratio = 1.43) consistent with at least submassive (  intermediate risk) PE. The presence of right heart strain has been associated with an increased risk of morbidity and mortality. Please activate Code PE by paging [phone]. Enlarged multinodular thyroid gland; follow-up non emergent thyroid ultrasound recommended. Critical Value/emergent results were called by telephone at the time of interpretation on 07/12/2019 at 2212 hrs to provider DR. Maudie Mercury, who verbally acknowledged these results. Electronically Signed   By: Lavonia Dana M.D.   On: 07/12/2019 22:13   Dg Chest Portable 1  View  Result Date: 07/12/2019 CLINICAL DATA:  Pt c/o central CP, SOB, nausea, and weakness. Pt hx CAD, asthma, arthritis. Pt is a former smoker, quit in the 80's. EXAM: PORTABLE CHEST 1 VIEW COMPARISON:  04/26/2019 FINDINGS: Borderline enlargement of the cardiopericardial silhouette. This is stable. No mediastinal or hilar masses. Clear lungs.  No pleural effusion or pneumothorax. Skeletal structures are grossly intact. IMPRESSION: No active disease. Electronically Signed   By: Lajean Manes M.D.   On: 07/12/2019 16:45   Dg Duanne Limerick W Single Cm (sol Or Thin Ba)  Result Date: 07/14/2019 CLINICAL DATA:  77 year old female with acute PE, but also a history of prior GI bleeding when on anticoagulation. That bleeding episode resolved spontaneously when anticoagulation was stopped. The patient declines an upper endoscopy, and so upper GI series is requested. However, due to limited mobility, a double contrast study is not feasible. The patient also relates a history of fibroids. EXAM: UPPER GI SERIES WITH KUB TECHNIQUE: After obtaining a scout radiograph a routine upper GI series was performed using barium FLUOROSCOPY TIME:  Fluoroscopy Time:  2 minutes 36 seconds Radiation Exposure Index (if provided by the fluoroscopic device): Number of Acquired Spot Images: 0 COMPARISON:  CTA chest 07/12/2019. FINDINGS: A single contrast study was undertaken and the patient tolerated this well and without difficulty. Due to limited mobility, the patient was imaged supine on the fluoroscopy table, which was inclined at certain parts of the exam. Preprocedural scout view of the abdomen. Normal bowel gas pattern. Dystrophic calcifications in the pelvis which are probably a combination of calcified fibroids and phleboliths. No obstruction to the forward flow of contrast throughout the esophagus and into the stomach. Normal esophageal course and contour. Decreased esophageal motility, and frequent tertiary contractions were noted. Stasis  and to and fro motion of the contrast in the esophagus subsequently occurred (series 9), but the patient was asymptomatic. At the end of the study all of the esophageal contrast had cleared. Normal gastroesophageal junction. Normal contrast filling of the stomach. The gastric contour and mucosal pattern appears within normal limits allowing for the limitations of single contrast technique. Good gastric mobility was noted. Early gastric emptying occurred (series 17). Likewise, the duodenum course, contour, and mucosal pattern is within normal limits. At the conclusion of the study multiple unremarkable appearing loops of jejunum were also opacified. No gastroesophageal reflux occurred spontaneously or was elicited. IMPRESSION: 1. Normal single contrast UGI appearance of the stomach and duodenum. 2. Presbyesophagus with decreased esophageal motility resulting in asymptomatic stasis of contrast in the esophagus which ultimately cleared. 3. Calcified uterine fibroid suspected. Electronically Signed   By: Genevie Ann M.D.   On: 07/14/2019 16:35   Vas [place] Lower Extremity Venous (dvt)  Result Date: 07/14/2019  Lower Venous Study Indications: Pulmonary embolism.  Risk Factors: None identified. Limitations: Body habitus and poor ultrasound/tissue interface. Comparison Study: 09/30/2018 - Chronic left PopV DVT Performing Technologist: Oliver Hum RVT  Examination Guidelines: A complete evaluation includes B-mode imaging, spectral Doppler, color  Doppler, and power Doppler as needed of all accessible portions of each vessel. Bilateral testing is considered an integral part of a complete examination. Limited examinations for reoccurring indications may be performed as noted.  +---------+---------------+---------+-----------+----------+--------------+ RIGHT    CompressibilityPhasicitySpontaneityPropertiesThrombus Aging +---------+---------------+---------+-----------+----------+--------------+ CFV      Full            Yes      Yes                                 +---------+---------------+---------+-----------+----------+--------------+ SFJ      Full                                                        +---------+---------------+---------+-----------+----------+--------------+ FV Prox  Full                                                        +---------+---------------+---------+-----------+----------+--------------+ FV Mid   Full                                                        +---------+---------------+---------+-----------+----------+--------------+ FV DistalFull                                                        +---------+---------------+---------+-----------+----------+--------------+ PFV      Full                                                        +---------+---------------+---------+-----------+----------+--------------+ POP      Full           Yes      Yes                                 +---------+---------------+---------+-----------+----------+--------------+ PTV      Full                                                        +---------+---------------+---------+-----------+----------+--------------+ PERO     Full                                                        +---------+---------------+---------+-----------+----------+--------------+   +---------+---------------+---------+-----------+----------+--------------+ LEFT     CompressibilityPhasicitySpontaneityPropertiesThrombus Aging +---------+---------------+---------+-----------+----------+--------------+ CFV  Full           Yes      Yes                                 +---------+---------------+---------+-----------+----------+--------------+ SFJ      Full                                                        +---------+---------------+---------+-----------+----------+--------------+ FV Prox  Full                                                         +---------+---------------+---------+-----------+----------+--------------+ FV Mid   Full                                                        +---------+---------------+---------+-----------+----------+--------------+ FV DistalFull                                                        +---------+---------------+---------+-----------+----------+--------------+ PFV      Full                                                        +---------+---------------+---------+-----------+----------+--------------+ POP      Full           Yes      Yes                                 +---------+---------------+---------+-----------+----------+--------------+ PTV      Full                                                        +---------+---------------+---------+-----------+----------+--------------+ PERO     Full                                                        +---------+---------------+---------+-----------+----------+--------------+     Summary: Right: There is no evidence of deep vein thrombosis in the lower extremity. No cystic structure found in the popliteal fossa. Left: There is no evidence of deep vein thrombosis in the lower extremity. No cystic structure found in the popliteal fossa.  *See table(s) above for measurements and observations. Electronically signed by Harold Barban MD  on 07/14/2019 at 5:40:20 PM.    Final        Subjective:   Patient seen and examined at the bedside this morning.  Hemodynamically stable for discharge today.  Discharge Exam: Vitals:   07/15/19 0404 07/15/19 0827  BP: 127/73 134/71  Pulse: 64 72  Resp: 18   Temp: 98.8 F (37.1 C) 99 F (37.2 C)  SpO2: 98%    Vitals:   07/14/19 2357 07/15/19 0404 07/15/19 0506 07/15/19 0827  BP: 118/64 127/73  134/71  Pulse:  64  72  Resp: 18 18    Temp: 98.8 F (37.1 C) 98.8 F (37.1 C)  99 F (37.2 C)  TempSrc: Oral Oral  Oral  SpO2: 96% 98%    Weight:   105.4 kg   Height:         General: Pt is alert, awake, not in acute distress Cardiovascular: RRR, S1/S2 +, no rubs, no gallops Respiratory: CTA bilaterally, no wheezing, no rhonchi Abdominal: Soft, NT, ND, bowel sounds + Extremities: no edema, no cyanosis    The results of significant diagnostics from this hospitalization (including imaging, microbiology, ancillary and laboratory) are listed below for reference.     Microbiology: Recent Results (from the past 240 hour(s))  SARS CORONAVIRUS 2 (TAT 6-24 HRS) Nasopharyngeal Nasopharyngeal Swab     Status: None   Collection Time: 07/12/19 11:46 PM   Specimen: Nasopharyngeal Swab  Result Value Ref Range Status   SARS Coronavirus 2 NEGATIVE NEGATIVE Final    Comment: (NOTE) SARS-CoV-2 target nucleic acids are NOT DETECTED. The SARS-CoV-2 RNA is generally detectable in upper and lower respiratory specimens during the acute phase of infection. Negative results do not preclude SARS-CoV-2 infection, do not rule out co-infections with other pathogens, and should not be used as the sole basis for treatment or other patient management decisions. Negative results must be combined with clinical observations, patient history, and epidemiological information. The expected result is Negative. Fact Sheet for Patients: SugarRoll.be Fact Sheet for Healthcare Providers: https://www.woods-mathews.com/ This test is not yet approved or cleared by the [place] FDA and  has been authorized for detection and/or diagnosis of SARS-CoV-2 by FDA under an Emergency Use Authorization (EUA). This EUA will remain  in effect (meaning this test can be used) for the duration of the COVID-19 declaration under Section 56 4(b)(1) of the Act, 21 U.S.C. section 360bbb-3(b)(1), unless the authorization is terminated or revoked sooner. Performed at [facility], [place] [address] [place], [place] [address]   MRSA PCR Screening      Status: None   Collection Time: 07/13/19 12:59 AM   Specimen: Nasal Mucosa; Nasopharyngeal  Result Value Ref Range Status   MRSA by PCR NEGATIVE NEGATIVE Final    Comment:        The GeneXpert MRSA Assay (FDA approved for NASAL specimens only), is one component of a comprehensive MRSA colonization surveillance program. It is not intended to diagnose MRSA infection nor to guide or monitor treatment for MRSA infections. Performed at [facility], [place] [address] [place], [place] [address]      Labs: BNP (last 3 results) Recent Labs    07/12/19 1721  BNP [id number]   Basic Metabolic Panel: Recent Labs  Lab 07/12/19 1721 07/13/19 0745  NA 140 138  K 3.6 4.5  CL 109 109  CO2 22 20*  GLUCOSE 135* 113*  BUN 20 22  CREATININE 1.01* 1.07*  CALCIUM 9.8 9.8   Liver Function  Tests: Recent Labs  Lab 07/12/19 1721 07/13/19 0745  AST 26 36  ALT 20 31  ALKPHOS 73 77  BILITOT 0.5 1.1  PROT 6.3* 6.4*  ALBUMIN 3.5 3.3*   No results for input(s): LIPASE, AMYLASE in the last 168 hours. No results for input(s): AMMONIA in the last 168 hours. CBC: Recent Labs  Lab 07/12/19 1721 07/13/19 0745 07/14/19 0227 07/15/19 0209  WBC 12.6* 9.3 8.9 8.6  NEUTROABS 10.7*  --   --   --   HGB 11.6* 11.6* 10.9* 11.4*  HCT 35.5* 35.0* 33.9* 34.6*  MCV 98.3 96.7 98.3 96.6  PLT 168 173 172 183   Cardiac Enzymes: No results for input(s): CKTOTAL, CKMB, CKMBINDEX, TROPONINI in the last 168 hours. BNP: Invalid input(s): POCBNP CBG: No results for input(s): GLUCAP in the last 168 hours. D-Dimer Recent Labs    07/12/19 1721  DDIMER 19.65*   Hgb A1c Recent Labs    07/13/19 0745  HGBA1C 5.4   Lipid Profile Recent Labs    07/13/19 0745  CHOL 198  HDL 55  LDLCALC 137*  TRIG 29  CHOLHDL 3.6   Thyroid function studies No results for input(s): TSH, T4TOTAL, T3FREE, THYROIDAB in the last 72 hours.  Invalid input(s): FREET3 Anemia work up No results for input(s):  VITAMINB12, FOLATE, FERRITIN, TIBC, IRON, RETICCTPCT in the last 72 hours. Urinalysis    Component Value Date/Time   COLORURINE YELLOW 08/15/2012 1140   APPEARANCEUR HAZY (A) 08/15/2012 1140   LABSPEC 1.029 08/15/2012 1140   PHURINE 5.0 08/15/2012 1140   GLUCOSEU NEGATIVE 08/15/2012 1140   HGBUR NEGATIVE 08/15/2012 1140   BILIRUBINUR NEGATIVE 08/15/2012 1140   KETONESUR NEGATIVE 08/15/2012 1140   PROTEINUR NEGATIVE 08/15/2012 1140   UROBILINOGEN 0.2 08/15/2012 1140   NITRITE NEGATIVE 08/15/2012 1140   LEUKOCYTESUR SMALL (A) 08/15/2012 1140   Sepsis Labs Invalid input(s): PROCALCITONIN,  WBC,  LACTICIDVEN Microbiology Recent Results (from the past 240 hour(s))  SARS CORONAVIRUS 2 (TAT 6-24 HRS) Nasopharyngeal Nasopharyngeal Swab     Status: None   Collection Time: 07/12/19 11:46 PM   Specimen: Nasopharyngeal Swab  Result Value Ref Range Status   SARS Coronavirus 2 NEGATIVE NEGATIVE Final    Comment: (NOTE) SARS-CoV-2 target nucleic acids are NOT DETECTED. The SARS-CoV-2 RNA is generally detectable in upper and lower respiratory specimens during the acute phase of infection. Negative results do not preclude SARS-CoV-2 infection, do not rule out co-infections with other pathogens, and should not be used as the sole basis for treatment or other patient management decisions. Negative results must be combined with clinical observations, patient history, and epidemiological information. The expected result is Negative. Fact Sheet for Patients: SugarRoll.be Fact Sheet for Healthcare Providers: https://www.woods-mathews.com/ This test is not yet approved or cleared by the [place] FDA and  has been authorized for detection and/or diagnosis of SARS-CoV-2 by FDA under an Emergency Use Authorization (EUA). This EUA will remain  in effect (meaning this test can be used) for the duration of the COVID-19 declaration under Section 56 4(b)(1)  of the Act, 21 U.S.C. section 360bbb-3(b)(1), unless the authorization is terminated or revoked sooner. Performed at [facility], [place] [address], [place], [place] [address]   MRSA PCR Screening     Status: None   Collection Time: 07/13/19 12:59 AM   Specimen: Nasal Mucosa; Nasopharyngeal  Result Value Ref Range Status   MRSA by PCR NEGATIVE NEGATIVE Final    Comment:  The GeneXpert MRSA Assay (FDA approved for NASAL specimens only), is one component of a comprehensive MRSA colonization surveillance program. It is not intended to diagnose MRSA infection nor to guide or monitor treatment for MRSA infections. Performed at [facility], [place] [address] [place],  [address]     Please note: You were cared for by a hospitalist during your hospital stay. Once you are discharged, your primary care physician will handle any further medical issues. Please note that NO REFILLS for any discharge medications will be authorized once you are discharged, as it is imperative that you return to your primary care physician (or establish a relationship with a primary care physician if you do not have one) for your post hospital discharge needs so that they can reassess your need for medications and monitor your lab values.    Time coordinating discharge: 40 minutes  SIGNED:   Shelly Coss, MD  [facility] 07/15/2019, 10:34 AM Pager [phone]  If 7PM-7AM, please contact night-coverage [email or web address] Password [email or web address]

## 2019-07-21 ENCOUNTER — Other Ambulatory Visit: Payer: Medicare HMO

## 2019-07-27 DIAGNOSIS — I2692 Saddle embolus of pulmonary artery without acute cor pulmonale: Secondary | ICD-10-CM | POA: Diagnosis not present

## 2019-07-27 DIAGNOSIS — R001 Bradycardia, unspecified: Secondary | ICD-10-CM | POA: Diagnosis not present

## 2019-07-27 DIAGNOSIS — I498 Other specified cardiac arrhythmias: Secondary | ICD-10-CM | POA: Diagnosis not present

## 2019-07-27 DIAGNOSIS — Z8719 Personal history of other diseases of the digestive system: Secondary | ICD-10-CM | POA: Diagnosis not present

## 2019-07-29 ENCOUNTER — Other Ambulatory Visit: Payer: Medicare HMO

## 2019-08-03 ENCOUNTER — Other Ambulatory Visit: Payer: Self-pay

## 2019-08-03 ENCOUNTER — Ambulatory Visit (INDEPENDENT_AMBULATORY_CARE_PROVIDER_SITE_OTHER): Payer: Medicare HMO | Admitting: Cardiology

## 2019-08-03 ENCOUNTER — Encounter: Payer: Self-pay | Admitting: Cardiology

## 2019-08-03 DIAGNOSIS — Z7901 Long term (current) use of anticoagulants: Secondary | ICD-10-CM | POA: Insufficient documentation

## 2019-08-03 DIAGNOSIS — I10 Essential (primary) hypertension: Secondary | ICD-10-CM | POA: Insufficient documentation

## 2019-08-03 DIAGNOSIS — IMO0001 Reserved for inherently not codable concepts without codable children: Secondary | ICD-10-CM

## 2019-08-03 DIAGNOSIS — I2602 Saddle embolus of pulmonary artery with acute cor pulmonale: Secondary | ICD-10-CM

## 2019-08-03 DIAGNOSIS — Z789 Other specified health status: Secondary | ICD-10-CM | POA: Diagnosis not present

## 2019-08-03 DIAGNOSIS — I252 Old myocardial infarction: Secondary | ICD-10-CM

## 2019-08-03 MED ORDER — HYDROCHLOROTHIAZIDE 12.5 MG PO CAPS: 12.5000 mg | ORAL_CAPSULE | Freq: Every day | ORAL | 3 refills | Status: DC

## 2019-08-03 NOTE — Patient Instructions (Addendum)
Medication Instructions:  START Hydrocholothoriadie 12.5mg  Take 1 tablet once a day  *If you need a refill on your cardiac medications before your next appointment, please call your pharmacy*  Lab Work: None  If you have labs (blood work) drawn today and your tests are completely normal, you will receive your results only by: Marland Kitchen MyChart Message (if you have MyChart) OR . A paper copy in the mail If you have any lab test that is abnormal or we need to change your treatment, we will call you to review the results.  Testing/Procedures: None   Follow-Up: At [facility], you and your health needs are our priority.  As part of our continuing mission to provide you with exceptional heart care, we have created designated Provider Care Teams.  These Care Teams include your primary Cardiologist (physician) and Advanced Practice Providers (APPs -  Physician Assistants and Nurse Practitioners) who all work together to provide you with the care you need, when you need it.  Your next appointment:   6 month(s)  The format for your next appointment:   Virtual Visit   Provider:   You may see Quay Burow, MD or one of the following Advanced Practice Providers on your designated Care Team:    [name], PA-C  [place], [place]  Coletta Memos, [place]   Other Instructions

## 2019-08-03 NOTE — Assessment & Plan Note (Signed)
No blood products

## 2019-08-03 NOTE — Assessment & Plan Note (Signed)
Pulmonary embolism and DVT diagnosed in Sept 2019- she was on anticoagulation for 3 months until Jan 2020 when it was stopped secondary to [place]- (no GI work up).  She then presented 07/12/2019 with repository failure and saddle pulmonary embolism- anticoagulation resumed.

## 2019-08-03 NOTE — Assessment & Plan Note (Addendum)
Eliquis resumed 07/12/2019-  lifelong anticoagulation recommended.

## 2019-08-03 NOTE — Assessment & Plan Note (Addendum)
Repeat B/P by me 154/84- I added HCTZ 12.5 mg daily- f/u with her PCP

## 2019-08-03 NOTE — Assessment & Plan Note (Signed)
Non-STEMI-HS Troponin was 21-Sept 2019. Cath nor CAD, echo showed apical/ inferior HK- EF 50-55% and it was suspected she had coronary spasm.

## 2019-08-03 NOTE — Progress Notes (Signed)
Cardiology Office Note:    Date:  08/03/2019   ID:  Karen Owens, DOB 09-Jun-1941, MRN [id number]  PCP:  Harlan Stains, MD  Cardiologist:  Quay Burow, MD  Electrophysiologist:  None   Referring MD: Harlan Stains, MD   CC:  Post hospital follow up  History of Present Illness:    Karen Owens is a 78 y.o. female with a hx of chest pain in September 2019.  At that time she had a mildly elevated troponin of 21.  Catheterization September 2019 showed normal coronaries.  Echocardiogram showed normal LV function with apical inferior hypokinesis.  It was suspected she may have had coronary spasm.  During that hospitalization she was subsequently diagnosed with a pulmonary embolism and DVT.  She was placed on Eliquis.  She had some reported melena in January 2020 and the Eliquis was discontinued, this had been after 3 months of therapy.  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  The patient then presented to the hospital 07/12/2019 with chest pain and shortness of breath.  She was in early respiratory failure.  CT scan revealed saddle pulmonary embolism.  Echocardiogram revealed an EF of 60 to 65% with mild LVH.  She had RV volume overload and moderately induced RV systolic function and enlargement.  She had moderately elevated PA pressures.  The patient was admitted admitted and seen in consult by the CCM.  It was not felt she would require lytics.  She was put on heparin and seen in consult by [facility].  Upper GI study did not show evidence of peptic ulcer disease and Eliquis was resumed.  CCM signed off 07/14/2019 but did indicate they wanted to see her in follow-up and possibly order a follow-up echocardiogram.  Patient is seen in the office today for follow-up.  She is done well since discharge.  She has had no bleeding.  She says her shortness of breath is significantly improved.  Her blood pressure remains elevated.  Her metoprolol was increased from 12.5 mg twice daily to 25  mg twice daily.  Repeat blood pressure by me in the office was 154/84.  Past Medical History:  Diagnosis Date  . Arthritis   . Asthma   . Complication of anesthesia    difficulty waking up  . DVT (deep venous thrombosis) ([place])   . Refusal of blood transfusions as patient is Jehovah's Witness   . Thyroid nodule     Past Surgical History:  Procedure Laterality Date  . DILATION AND CURETTAGE OF UTERUS    . LEFT HEART CATH AND CORONARY ANGIOGRAPHY N/A 06/09/2018   Procedure: LEFT HEART CATH AND CORONARY ANGIOGRAPHY;  Surgeon: Lorretta Harp, MD;  Location: [facility];  Service: Cardiovascular;  Laterality: N/A;  . LIPOMA EXCISION     right arm  . TOTAL KNEE ARTHROPLASTY     left  . TOTAL KNEE ARTHROPLASTY  08/20/2012   right knee  . TOTAL KNEE ARTHROPLASTY  08/20/2012   Procedure: TOTAL KNEE ARTHROPLASTY;  Surgeon: Ninetta Lights, MD;  Location: [facility];  Service: Orthopedics;  Laterality: Right;    Current Medications: Current Meds  Medication Sig  . apixaban (ELIQUIS) 5 MG TABS tablet Take 2 tablets (10 mg total) by mouth 2 (two) times daily for 7 days.  Marland Kitchen apixaban (ELIQUIS) 5 MG TABS tablet Take 1 tablet (5 mg total) by mouth 2 (two) times daily.  . APPLE CIDER VINEGAR PO Take 250 mg by mouth  daily.  . atorvastatin (LIPITOR) 20 MG tablet Take 1 tablet (20 mg total) by mouth daily at 6 PM.  . carboxymethylcellulose (REFRESH TEARS) 0.5 % SOLN Place 1 drop into both eyes 2 (two) times daily.   . Coenzyme Q10 (CO Q 10 PO) Take by mouth.  Marland Kitchen ibuprofen (ADVIL,MOTRIN) 800 MG tablet Take 800 mg by mouth every 8 (eight) hours as needed (inflammation/pain).   . metoprolol tartrate (LOPRESSOR) 25 MG tablet Take 25 mg by mouth 2 (two) times daily.   . nitroGLYCERIN (NITROSTAT) 0.4 MG SL tablet Place 1 tablet (0.4 mg total) under the tongue every 5 (five) minutes x 3 doses as needed. (Patient taking differently: Place 0.4 mg under the tongue every 5 (five) minutes x 3 doses as needed  for chest pain. )  . Nutritional Supplements (IMMUNOCAL PO) Take 1 packet by mouth daily. Mix in juice and drink  . pantoprazole (PROTONIX) 40 MG tablet Take 1 tablet (40 mg total) by mouth daily.     Allergies:   Tuberculin tests and Other   Social History   Socioeconomic History  . Marital status: Divorced    Spouse name: Not on file  . Number of children: Not on file  . Years of education: Not on file  . Highest education level: Not on file  Occupational History  . Not on file  Social Needs  . Financial resource strain: Not on file  . Food insecurity    Worry: Not on file    Inability: Not on file  . Transportation needs    Medical: Not on file    Non-medical: Not on file  Tobacco Use  . Smoking status: Former Smoker    Quit date: 09/10/1982    Years since quitting: 36.9  . Smokeless tobacco: Never Used  Substance and Sexual Activity  . Alcohol use: Yes    Comment: occasional  . Drug use: No  . Sexual activity: Never    Birth control/protection: Post-menopausal  Lifestyle  . Physical activity    Days per week: Not on file    Minutes per session: Not on file  . Stress: Not on file  Relationships  . Social Herbalist on phone: Not on file    Gets together: Not on file    Attends religious service: Not on file    Active member of club or organization: Not on file    Attends meetings of clubs or organizations: Not on file    Relationship status: Not on file  Other Topics Concern  . Not on file  Social History Narrative  . Not on file     Family History: The patient's family history includes Heart attack in her brother.  ROS:   Please see the history of present illness.    All other systems reviewed and are negative.  EKGs/Labs/Other Studies Reviewed:    The following studies were reviewed today: Echo 07/13/2019  Recent Labs: 04/27/2019: TSH 1.788 07/12/2019: B Natriuretic Peptide 27.2 07/13/2019: ALT 31; BUN 22; Creatinine, Ser 1.07; Potassium  4.5; Sodium 138 07/15/2019: Hemoglobin 11.4; Platelets 183  Recent Lipid Panel    Component Value Date/Time   CHOL 198 07/13/2019 0745   TRIG 29 07/13/2019 0745   HDL 55 07/13/2019 0745   CHOLHDL 3.6 07/13/2019 0745   VLDL 6 07/13/2019 0745   LDLCALC 137 (H) 07/13/2019 0745    Physical Exam:    VS:  BP (!) 155/95   Pulse 74   Ht  5\' 6"  (1.676 m)   Wt 233 lb (105.7 kg)   SpO2 100%   BMI 37.61 kg/m     Wt Readings from Last 3 Encounters:  08/03/19 233 lb (105.7 kg)  07/15/19 232 lb 5.8 oz (105.4 kg)  05/20/19 234 lb 6.4 oz (106.3 kg)     GEN:  Well nourished, well developed in no acute distress HEENT: Normal NECK: No JVD; No carotid bruits LYMPHATICS: No lymphadenopathy CARDIAC: RRR, no murmurs, rubs, gallops RESPIRATORY:  Clear to auscultation without rales, wheezing or rhonchi  ABDOMEN: Soft, non-tender, non-distended MUSCULOSKELETAL:  No edema; No deformity  SKIN: Warm and dry NEUROLOGIC:  Alert and oriented x 3 PSYCHIATRIC:  Normal affect   ASSESSMENT:    Pulmonary embolism (HCC) Pulmonary embolism and DVT diagnosed in Sept 2019- she was on anticoagulation for 3 months until Jan 2020 when it was stopped secondary to [place]- (no GI work up).  She then presented 07/12/2019 with repository failure and saddle pulmonary embolism- anticoagulation resumed.  History of non-ST elevation myocardial infarction (NSTEMI) Non-STEMI-HS Troponin was 21-Sept 2019. Cath nor CAD, echo showed apical/ inferior HK- EF 50-55% and it was suspected she had coronary spasm.  Patient is Jehovah's Witness No blood products  Essential hypertension Repeat B/P by me 154/84- I added HCTZ 12.5 mg daily- f/u with her PCP  Anticoagulated Eliquis resumed 07/12/2019-  lifelong anticoagulation recommended.   PLAN:    Add HCTZ 12.5 mg daily- F/U B/P with PCP.  I will arrange for pulmonary follow up.  Cardiology can see he in 6 months.    Medication Adjustments/Labs and Tests Ordered: Current  medicines are reviewed at length with the patient today.  Concerns regarding medicines are outlined above.  Orders Placed This Encounter  Procedures  . Ambulatory referral to Pulmonology   Meds ordered this encounter  Medications  . hydrochlorothiazide (MICROZIDE) 12.5 MG capsule    Sig: Take 1 capsule (12.5 mg total) by mouth daily.    Dispense:  90 capsule    Refill:  3    Patient Instructions  Medication Instructions:  START Hydrocholothoriadie 12.5mg  Take 1 tablet once a day  *If you need a refill on your cardiac medications before your next appointment, please call your pharmacy*  Lab Work: None  If you have labs (blood work) drawn today and your tests are completely normal, you will receive your results only by: Marland Kitchen MyChart Message (if you have MyChart) OR . A paper copy in the mail If you have any lab test that is abnormal or we need to change your treatment, we will call you to review the results.  Testing/Procedures: None   Follow-Up: At [facility], you and your health needs are our priority.  As part of our continuing mission to provide you with exceptional heart care, we have created designated Provider Care Teams.  These Care Teams include your primary Cardiologist (physician) and Advanced Practice Providers (APPs -  Physician Assistants and Nurse Practitioners) who all work together to provide you with the care you need, when you need it.  Your next appointment:   6 month(s)  The format for your next appointment:   Virtual Visit   Provider:   You may see Quay Burow, MD or one of the following Advanced Practice Providers on your designated Care Team:    [name], PA-C  Sande Rives, [place]  Coletta Memos, FNP   Other Instructions     Signed, [name], [place]  08/03/2019 2:08 PM  [facility]

## 2019-08-14 ENCOUNTER — Encounter: Payer: Self-pay | Admitting: Pulmonary Disease

## 2019-08-14 ENCOUNTER — Other Ambulatory Visit: Payer: Self-pay

## 2019-08-14 ENCOUNTER — Ambulatory Visit (INDEPENDENT_AMBULATORY_CARE_PROVIDER_SITE_OTHER): Payer: Medicare HMO | Admitting: Pulmonary Disease

## 2019-08-14 ENCOUNTER — Ambulatory Visit: Payer: Medicare HMO | Admitting: Cardiovascular Disease

## 2019-08-14 VITALS — BP 110/78 | HR 74 | Temp 97.8°F | Ht 66.0 in | Wt 232.6 lb

## 2019-08-14 DIAGNOSIS — I2602 Saddle embolus of pulmonary artery with acute cor pulmonale: Secondary | ICD-10-CM | POA: Diagnosis not present

## 2019-08-14 NOTE — Progress Notes (Signed)
Karen Owens    [id number]    1941-07-03  Primary Care Physician:White, Caren Griffins, MD  Referring Physician: Harlan Stains, MD [place],  [place] [address]  Chief complaint: Follow-up for submassive PE  HPI: 78yo female w/ a past medical history significant for hypertension, hyperlipidemia, asthma and DVT x1 year ago, not currently on anticoagulation, that presented to the ED this evening with complaints of shortness of breath and chest pain without relief from Nitro x3 doses. ED workup included a CTA of chest which revealed a large saddle pulmonary embolus.  Discharged on Eliquis anticoagulation Post discharge she is doing well with improvement in breathing.  The patient reports approximately 1 year ago she was diagnosed w/a DVT and was placed on Eliquis but approx 2-3 weeks after taking it developed a GI bleed and therefore discontinued therapy. She did not undergo an endoscopy procedure at that time. Of note patient is a Jehovah witness, no blood products  Pets: Has a dog.  No birds, farm animals Occupation: Retired Theatre manager Exposures: No known exposures.  No mold, hot tub, Jacuzzi Smoking history: Social smoker.  Quit in 1984 Travel history: No significant travel history Relevant family history: No significant family history of lung disease   Outpatient Encounter Medications as of 08/14/2019  Medication Sig  . apixaban (ELIQUIS) 5 MG TABS tablet Take 1 tablet (5 mg total) by mouth 2 (two) times daily.  . APPLE CIDER VINEGAR PO Take 250 mg by mouth daily.  Marland Kitchen atorvastatin (LIPITOR) 20 MG tablet Take 1 tablet (20 mg total) by mouth daily at 6 PM.  . carboxymethylcellulose (REFRESH TEARS) 0.5 % SOLN Place 1 drop into both eyes 2 (two) times daily.   . Coenzyme Q10 (CO Q 10 PO) Take by mouth.  . hydrochlorothiazide (MICROZIDE) 12.5 MG capsule Take 1 capsule (12.5 mg total) by mouth daily.  Marland Kitchen ibuprofen (ADVIL,MOTRIN) 800 MG tablet Take 800 mg by  mouth every 8 (eight) hours as needed (inflammation/pain).   . metoprolol tartrate (LOPRESSOR) 25 MG tablet Take 25 mg by mouth 2 (two) times daily.   . nitroGLYCERIN (NITROSTAT) 0.4 MG SL tablet Place 1 tablet (0.4 mg total) under the tongue every 5 (five) minutes x 3 doses as needed. (Patient taking differently: Place 0.4 mg under the tongue every 5 (five) minutes x 3 doses as needed for chest pain. )  . Nutritional Supplements (IMMUNOCAL PO) Take 1 packet by mouth daily. Mix in juice and drink  . pantoprazole (PROTONIX) 40 MG tablet Take 1 tablet (40 mg total) by mouth daily.  Marland Kitchen apixaban (ELIQUIS) 5 MG TABS tablet Take 2 tablets (10 mg total) by mouth 2 (two) times daily for 7 days.   No facility-administered encounter medications on file as of 08/14/2019.     Allergies as of 08/14/2019 - Review Complete 08/14/2019  Allergen Reaction Noted  . Tuberculin tests Hives 06/06/2018  . Other Other (See Comments) 04/26/2019   Physical Exam: Blood pressure 110/78, pulse 74, temperature 97.8 F (36.6 C), temperature source Temporal, height 5\' 6"  (1.676 m), weight 232 lb 9.6 oz (105.5 kg), SpO2 99 %. Gen:      No acute distress HEENT:  EOMI, sclera anicteric Neck:     No masses; no thyromegaly Lungs:    Clear to auscultation bilaterally; normal respiratory effort CV:         Regular rate and rhythm; no murmurs Abd:      +  bowel sounds; soft, non-tender; no palpable masses, no distension Ext:    No edema; adequate peripheral perfusion Skin:      Warm and dry; no rash Neuro: alert and oriented x 3 Psych: normal mood and affect  Data Reviewed: Imaging: CTA-chest: Large saddle PE at bifurcation of the left PA extending into left upper and left lower lobes. Large embolus identified at bifurcation of right PA extending into upper lobe descending interlobar pulmonary artery and right lower lobe. RV/LV ratio 1.43  Echocardiogram 11/2-LVEF 60 - 65%, moderate reduction in RV systolic function with  moderate enlargement.  Moderate pulmonary hypertension with RV systolic pressure of [id number]  Assessment:  Submassive PE, pulmonary hypertension Doing well post discharge Continue Eliquis anticoagulation.  Will need indefinite therapy as this is a recurrent episode of venous thromboembolism We will get echocardiogram in 6 months to reevaluate pulmonary hypertension  Plan/Recommendations: - Continue Eliquis - Echocardiogram in 6 months  Marshell Garfinkel MD  Pulmonary and Critical Care 08/14/2019, 1:40 PM  CC: Harlan Stains, MD

## 2019-08-14 NOTE — Patient Instructions (Signed)
Glad you are doing well with your breathing Continue the Eliquis We will schedule an echocardiogram in 6 months and follow-up in clinic after that.

## 2019-08-26 ENCOUNTER — Telehealth: Payer: Self-pay | Admitting: Pulmonary Disease

## 2019-08-26 NOTE — Telephone Encounter (Signed)
Verified with Judeen Hammans, who advised that this will be done at the same office as [name] on [facility] with pt, aware of this.  Nothing further needed at this time- will close encounter.

## 2019-09-23 DIAGNOSIS — H04123 Dry eye syndrome of bilateral lacrimal glands: Secondary | ICD-10-CM | POA: Diagnosis not present

## 2019-09-23 DIAGNOSIS — H2511 Age-related nuclear cataract, right eye: Secondary | ICD-10-CM | POA: Diagnosis not present

## 2019-09-23 DIAGNOSIS — H25812 Combined forms of age-related cataract, left eye: Secondary | ICD-10-CM | POA: Diagnosis not present

## 2019-09-23 DIAGNOSIS — Z0100 Encounter for examination of eyes and vision without abnormal findings: Secondary | ICD-10-CM | POA: Diagnosis not present

## 2019-12-02 DIAGNOSIS — M2669 Other specified disorders of temporomandibular joint: Secondary | ICD-10-CM | POA: Diagnosis not present

## 2019-12-02 DIAGNOSIS — H9201 Otalgia, right ear: Secondary | ICD-10-CM | POA: Diagnosis not present

## 2019-12-02 DIAGNOSIS — L918 Other hypertrophic disorders of the skin: Secondary | ICD-10-CM | POA: Diagnosis not present

## 2020-02-01 ENCOUNTER — Other Ambulatory Visit: Payer: Self-pay

## 2020-02-01 ENCOUNTER — Encounter: Payer: Self-pay | Admitting: Cardiology

## 2020-02-01 ENCOUNTER — Ambulatory Visit: Payer: Medicare HMO | Admitting: Cardiology

## 2020-02-01 VITALS — BP 138/88 | HR 76 | Ht 66.0 in | Wt 233.4 lb

## 2020-02-01 DIAGNOSIS — IMO0001 Reserved for inherently not codable concepts without codable children: Secondary | ICD-10-CM

## 2020-02-01 DIAGNOSIS — I252 Old myocardial infarction: Secondary | ICD-10-CM

## 2020-02-01 DIAGNOSIS — Z789 Other specified health status: Secondary | ICD-10-CM | POA: Diagnosis not present

## 2020-02-01 DIAGNOSIS — Z86711 Personal history of pulmonary embolism: Secondary | ICD-10-CM | POA: Diagnosis not present

## 2020-02-01 NOTE — Patient Instructions (Signed)
Medication Instructions:  CONTINUE WITH CURRENT MEDICATIONS. NO CHANGES.  *If you need a refill on your cardiac medications before your next appointment, please call your pharmacy*  Testing/Procedures: Your physician has requested that you have an echocardiogram. Echocardiography is a painless test that uses sound waves to create images of your heart. It provides your doctor with information about the size and shape of your heart and how well your heart's chambers and valves are working. This procedure takes approximately one hour. There are no restrictions for this procedure.  [address]   Follow-Up: At [facility], you and your health needs are our priority.  As part of our continuing mission to provide you with exceptional heart care, we have created designated Provider Care Teams.  These Care Teams include your primary Cardiologist (physician) and Advanced Practice Providers (APPs -  Physician Assistants and Nurse Practitioners) who all work together to provide you with the care you need, when you need it.  We recommend signing up for the patient portal called "MyChart".  Sign up information is provided on this After Visit Summary.  MyChart is used to connect with patients for Virtual Visits (Telemedicine).  Patients are able to view lab/test results, encounter notes, upcoming appointments, etc.  Non-urgent messages can be sent to your provider as well.   To learn more about what you can do with MyChart, go to [email or web address].    Your next appointment:   12 month(s)  The format for your next appointment:   In Person  Provider:   Quay Burow, MD

## 2020-02-01 NOTE — Progress Notes (Signed)
Cardiology Office Note:    Date:  02/01/2020   ID:  Karen Owens, DOB 06-12-1941, MRN [id number]  PCP:  Harlan Stains, MD  Cardiologist:  Quay Burow, MD  Electrophysiologist:  None   Referring MD: Harlan Stains, MD   No chief complaint on file.   History of Present Illness:    Karen Owens is a pleasant 79 y.o. female with a hx of chest pain September 2019.  At that time she had an elevated troponin of 21.  Catheterization done then showed normal coronaries.  Echocardiogram showed normal LV function with apical inferior hypokinesis.  It was suspected she may have had coronary spasm.  During that hospitalization she was subsequently diagnosed with a pulmonary embolism and DVT.  She was placed on Eliquis.  She had some reported melena in January 2020 and the Eliquis was discontinued, this had been after 3 months of therapy.  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  The patient then presented to the hospital 07/12/2019 with chest pain and shortness of breath.  She was in early respiratory failure.  CT scan revealed saddle pulmonary embolism.  Echocardiogram revealed an EF of 60 to 65% with mild LVH.  She had RV volume overload and moderately induced RV systolic function and enlargement with moderately elevated PA pressures.  The patient was admitted admitted and seen in consult by the CCM.  It was not felt she would require lytics.  She was put on heparin and seen in consult by [facility].  Upper GI study did not show evidence of peptic ulcer disease and Eliquis was resumed.    Since then she has done well.  She saw Dr Vaughan Browner in Dec 2020 who suggested a f/u echo be done in June.  Today the patient says she continues to do well, walking 3-4 times a week without chest pain or SOB.  She is living with her daughter who is an Therapist, sports.  She has been compliant with her Eliquis but declines statin Rx (LDL 137) and declines Metoprolol Rx.   Past Medical History:  Diagnosis Date  .  Arthritis   . Asthma   . Complication of anesthesia    difficulty waking up  . DVT (deep venous thrombosis) ([place])   . Refusal of blood transfusions as patient is Jehovah's Witness   . Thyroid nodule     Past Surgical History:  Procedure Laterality Date  . DILATION AND CURETTAGE OF UTERUS    . LEFT HEART CATH AND CORONARY ANGIOGRAPHY N/A 06/09/2018   Procedure: LEFT HEART CATH AND CORONARY ANGIOGRAPHY;  Surgeon: Lorretta Harp, MD;  Location: [facility];  Service: Cardiovascular;  Laterality: N/A;  . LIPOMA EXCISION     right arm  . TOTAL KNEE ARTHROPLASTY     left  . TOTAL KNEE ARTHROPLASTY  08/20/2012   right knee  . TOTAL KNEE ARTHROPLASTY  08/20/2012   Procedure: TOTAL KNEE ARTHROPLASTY;  Surgeon: Ninetta Lights, MD;  Location: [facility];  Service: Orthopedics;  Laterality: Right;    Current Medications: Current Meds  Medication Sig  . apixaban (ELIQUIS) 5 MG TABS tablet Take 1 tablet (5 mg total) by mouth 2 (two) times daily.  . APPLE CIDER VINEGAR PO Take 250 mg by mouth daily.  . carboxymethylcellulose (REFRESH TEARS) 0.5 % SOLN Place 1 drop into both eyes 2 (two) times daily.   . Coenzyme Q10 (CO Q 10 PO) Take by mouth.  Marland Kitchen ibuprofen (ADVIL,MOTRIN) 800  MG tablet Take 800 mg by mouth every 8 (eight) hours as needed (inflammation/pain).   . nitroGLYCERIN (NITROSTAT) 0.4 MG SL tablet Place 1 tablet (0.4 mg total) under the tongue every 5 (five) minutes x 3 doses as needed. (Patient taking differently: Place 0.4 mg under the tongue every 5 (five) minutes x 3 doses as needed for chest pain. )  . Nutritional Supplements (IMMUNOCAL PO) Take 1 packet by mouth daily. Mix in juice and drink  . [DISCONTINUED] atorvastatin (LIPITOR) 20 MG tablet Take 1 tablet (20 mg total) by mouth daily at 6 PM.  . [DISCONTINUED] metoprolol tartrate (LOPRESSOR) 25 MG tablet Take 25 mg by mouth 2 (two) times daily.      Allergies:   Tuberculin tests and Other   Social History    Socioeconomic History  . Marital status: Divorced    Spouse name: Not on file  . Number of children: Not on file  . Years of education: Not on file  . Highest education level: Not on file  Occupational History  . Not on file  Tobacco Use  . Smoking status: Former Smoker    Quit date: 09/10/1982    Years since quitting: 37.4  . Smokeless tobacco: Never Used  Substance and Sexual Activity  . Alcohol use: Yes    Comment: occasional  . Drug use: No  . Sexual activity: Never    Birth control/protection: Post-menopausal  Other Topics Concern  . Not on file  Social History Narrative  . Not on file   Social Determinants of Health   Financial Resource Strain:   . Difficulty of Paying Living Expenses:   Food Insecurity:   . Worried About Charity fundraiser in the Last Year:   . Arboriculturist in the Last Year:   Transportation Needs:   . Film/video editor (Medical):   Marland Kitchen Lack of Transportation (Non-Medical):   Physical Activity:   . Days of Exercise per Week:   . Minutes of Exercise per Session:   Stress:   . Feeling of Stress :   Social Connections:   . Frequency of Communication with Friends and Family:   . Frequency of Social Gatherings with Friends and Family:   . Attends Religious Services:   . Active Member of Clubs or Organizations:   . Attends Archivist Meetings:   Marland Kitchen Marital Status:      Family History: The patient's family history includes Heart attack in her brother.  ROS:   Please see the history of present illness.     All other systems reviewed and are negative.  EKGs/Labs/Other Studies Reviewed:    The following studies were reviewed today: Echo 07/13/2019  EKG:  EKG is not ordered today.  The ekg ordered 07/13/2019 demonstrates NSR- HR 97, RVH, Q in lead 3.   Recent Labs: 04/27/2019: TSH 1.788 07/12/2019: B Natriuretic Peptide 27.2 07/13/2019: ALT 31; BUN 22; Creatinine, Ser 1.07; Potassium 4.5; Sodium 138 07/15/2019: Hemoglobin  11.4; Platelets 183  Recent Lipid Panel    Component Value Date/Time   CHOL 198 07/13/2019 0745   TRIG 29 07/13/2019 0745   HDL 55 07/13/2019 0745   CHOLHDL 3.6 07/13/2019 0745   VLDL 6 07/13/2019 0745   LDLCALC 137 (H) 07/13/2019 0745    Physical Exam:    VS:  BP 138/88   Pulse 76   Ht 5\' 6"  (1.676 m)   Wt 233 lb 6.4 oz (105.9 kg)   BMI 37.67 kg/m  Wt Readings from Last 3 Encounters:  02/01/20 233 lb 6.4 oz (105.9 kg)  08/14/19 232 lb 9.6 oz (105.5 kg)  08/03/19 233 lb (105.7 kg)     GEN: Obese AA female, well developed in no acute distress HEENT: Normal NECK: No JVD; No carotid bruits CARDIAC: RRR, soft systolic murmur heard throughout the precordium, no rubs or gallops RESPIRATORY:  Clear to auscultation without rales, wheezing or rhonchi  ABDOMEN: obese, soft, non-tender, non-distended MUSCULOSKELETAL:  No edema; No deformity  SKIN: Warm and dry NEUROLOGIC:  Alert and oriented x 3 PSYCHIATRIC:  Normal affect   ASSESSMENT:    Pulmonary embolism (HCC) Pulmonary embolism and DVT diagnosed in Sept 2019- she was on anticoagulation for 3 months until Jan 2020 when it was stopped secondary to [place]- (no GI work up).  She then presented 07/12/2019 with respiratory failure and saddle pulmonary embolism- anticoagulation resumed after negative GI w/u.   History of non-ST elevation myocardial infarction (NSTEMI) Non-STEMI-HS Troponin was 21-Sept 2019. Cath showed no CAD, echo showed apical/ inferior HK- EF 50-55% and it was suspected she had coronary spasm.  Patient is Jehovah's Witness No blood products  Essential hypertension Repeat B/P by me 118/78 with large cuff.  HLD- LDL 137- she declines statin Rx  Anticoagulated Eliquis resumed 07/12/2019-  lifelong anticoagulation recommended.    PLAN:    Check echo June 2021 to follow up on RVD and pulmonary HTN seen on her Nov 2020 echo.  F/U with Dr Gwenlyn Found in a year if her echo is stable.    Medication  Adjustments/Labs and Tests Ordered: Current medicines are reviewed at length with the patient today.  Concerns regarding medicines are outlined above.  No orders of the defined types were placed in this encounter.  No orders of the defined types were placed in this encounter.   There are no Patient Instructions on file for this visit.   Angelena Form, PA-C  02/01/2020 3:07 PM    [facility]

## 2020-02-12 ENCOUNTER — Other Ambulatory Visit: Payer: Self-pay

## 2020-02-12 ENCOUNTER — Ambulatory Visit (HOSPITAL_COMMUNITY): Payer: Medicare HMO | Attending: Cardiology

## 2020-02-12 DIAGNOSIS — I2602 Saddle embolus of pulmonary artery with acute cor pulmonale: Secondary | ICD-10-CM | POA: Diagnosis not present

## 2020-02-18 ENCOUNTER — Telehealth: Payer: Self-pay | Admitting: Emergency Medicine

## 2020-02-18 NOTE — Telephone Encounter (Signed)
Spoke with patient, informed her of Dr. Kennon Holter recommendations. Patient verbalized understanding.

## 2020-02-18 NOTE — Telephone Encounter (Signed)
I have no problem with her flying as long as she gets up every couple of hours to walk around given prior DVTs  JJB

## 2020-02-18 NOTE — Telephone Encounter (Signed)
Spoke with patient. Awaiting results to be released by physician. Patient also wanted to discuss recommendations. She states she plans to fly to [place] but wasn't sure if it is safe due to her "blood clots." Patient had emboli in November. Will route to physician for review.

## 2020-02-18 NOTE — Telephone Encounter (Signed)
Patient called to request to see if she can get her echo results. Patient asked if she could be called after lunch.

## 2020-02-18 NOTE — Telephone Encounter (Signed)
Karen Owens I would defer the question of her flying to Dr Gwenlyn Found and he may want to see her echo results first.  [name] PA-C 02/18/2020 12:07 PM

## 2020-02-24 ENCOUNTER — Other Ambulatory Visit (HOSPITAL_COMMUNITY): Payer: Medicare HMO

## 2020-02-25 ENCOUNTER — Telehealth: Payer: Self-pay | Admitting: Cardiology

## 2020-02-25 NOTE — Telephone Encounter (Signed)
Called patient, gave results. She asked that we send the report of the ECHO to her.  Mailed out today.

## 2020-02-25 NOTE — Telephone Encounter (Signed)
2D echocardiogram showed normal LV systolic function, grade 1 diastolic dysfunction and very mild aortic stenosis.

## 2020-02-25 NOTE — Telephone Encounter (Signed)
Spoke to nurse working with [employer] today-  She will have him look at the ECHO results and I can call her.

## 2020-02-25 NOTE — Telephone Encounter (Signed)
Karen Owens is calling back for the second time requesting her Echo results. She is bothered that she has called more than once about it over the span of two weeks with no callback with the results. Please advise.

## 2020-04-11 ENCOUNTER — Telehealth: Payer: Self-pay | Admitting: Cardiology

## 2020-04-11 NOTE — Telephone Encounter (Signed)
    Pt c/o Shortness Of Breath: STAT if SOB developed within the last 24 hours or pt is noticeably SOB on the phone  1. Are you currently SOB (can you hear that pt is SOB on the phone)? No  2. How long have you been experiencing SOB? 3-5 days  3. Are you SOB when sitting or when up moving around? Up and moving  4. Are you currently experiencing any other symptoms? No  Pt said she notice she gets SOB when she go up the stairs or just going to her mailbox, per pt no other symptoms

## 2020-04-11 NOTE — Telephone Encounter (Signed)
Called patient- she states that she has been short of breath for about a week doing any activities and she is concerned.  Patient wanted an appointment, gave soonest that we had on 26th.  Patient made this appointment but was advised to contact primary care to discuss further to see if they could see her sooner, patient will do this as well.

## 2020-04-13 DIAGNOSIS — R0600 Dyspnea, unspecified: Secondary | ICD-10-CM | POA: Diagnosis not present

## 2020-04-13 DIAGNOSIS — I10 Essential (primary) hypertension: Secondary | ICD-10-CM | POA: Diagnosis not present

## 2020-04-13 DIAGNOSIS — Z86711 Personal history of pulmonary embolism: Secondary | ICD-10-CM | POA: Diagnosis not present

## 2020-04-18 ENCOUNTER — Ambulatory Visit
Admission: RE | Admit: 2020-04-18 | Discharge: 2020-04-18 | Disposition: A | Discharge status: Home / Self Care | Payer: Medicare HMO | Source: Ambulatory Visit | Attending: Family Medicine | Admitting: Family Medicine

## 2020-04-18 ENCOUNTER — Other Ambulatory Visit: Payer: Self-pay | Admitting: Family Medicine

## 2020-04-18 DIAGNOSIS — R0609 Other forms of dyspnea: Secondary | ICD-10-CM

## 2020-04-18 DIAGNOSIS — R0602 Shortness of breath: Secondary | ICD-10-CM | POA: Diagnosis not present

## 2020-05-05 ENCOUNTER — Ambulatory Visit: Payer: Medicare HMO | Admitting: General Practice

## 2020-08-25 DIAGNOSIS — Z0100 Encounter for examination of eyes and vision without abnormal findings: Secondary | ICD-10-CM | POA: Diagnosis not present

## 2020-09-06 DIAGNOSIS — L821 Other seborrheic keratosis: Secondary | ICD-10-CM | POA: Diagnosis not present

## 2020-09-21 DIAGNOSIS — L821 Other seborrheic keratosis: Secondary | ICD-10-CM | POA: Diagnosis not present

## 2020-09-21 DIAGNOSIS — L820 Inflamed seborrheic keratosis: Secondary | ICD-10-CM | POA: Diagnosis not present

## 2020-09-28 DIAGNOSIS — H04123 Dry eye syndrome of bilateral lacrimal glands: Secondary | ICD-10-CM | POA: Diagnosis not present

## 2020-09-28 DIAGNOSIS — H25813 Combined forms of age-related cataract, bilateral: Secondary | ICD-10-CM | POA: Diagnosis not present

## 2020-11-02 DIAGNOSIS — L820 Inflamed seborrheic keratosis: Secondary | ICD-10-CM | POA: Diagnosis not present

## 2020-11-14 DIAGNOSIS — Z1231 Encounter for screening mammogram for malignant neoplasm of breast: Secondary | ICD-10-CM | POA: Diagnosis not present

## 2020-12-13 DIAGNOSIS — H1011 Acute atopic conjunctivitis, right eye: Secondary | ICD-10-CM | POA: Diagnosis not present

## 2020-12-13 DIAGNOSIS — H11821 Conjunctivochalasis, right eye: Secondary | ICD-10-CM | POA: Diagnosis not present

## 2020-12-13 DIAGNOSIS — H25813 Combined forms of age-related cataract, bilateral: Secondary | ICD-10-CM | POA: Diagnosis not present

## 2020-12-13 DIAGNOSIS — H16211 Exposure keratoconjunctivitis, right eye: Secondary | ICD-10-CM | POA: Diagnosis not present

## 2020-12-13 DIAGNOSIS — H04123 Dry eye syndrome of bilateral lacrimal glands: Secondary | ICD-10-CM | POA: Diagnosis not present

## 2020-12-22 DIAGNOSIS — H16211 Exposure keratoconjunctivitis, right eye: Secondary | ICD-10-CM | POA: Diagnosis not present

## 2021-01-11 DIAGNOSIS — M25512 Pain in left shoulder: Secondary | ICD-10-CM | POA: Diagnosis not present

## 2021-01-11 DIAGNOSIS — M6281 Muscle weakness (generalized): Secondary | ICD-10-CM | POA: Diagnosis not present

## 2021-01-11 DIAGNOSIS — M25511 Pain in right shoulder: Secondary | ICD-10-CM | POA: Diagnosis not present

## 2021-01-11 DIAGNOSIS — M25611 Stiffness of right shoulder, not elsewhere classified: Secondary | ICD-10-CM | POA: Diagnosis not present

## 2021-02-03 ENCOUNTER — Ambulatory Visit: Payer: PPO | Admitting: Cardiovascular Disease

## 2021-03-03 IMAGING — RF DG UGI W SINGLE CM
12 of 16 series · 13 of 24 positions shown · non-contrast
Comparison: CTA chest 07/12/2019.

CLINICAL DATA: 78-year-old female with acute PE, but also a history
of prior GI bleeding when on anticoagulation.
TECHNIQUE: After obtaining a scout radiograph a routine upper GI series was
performed using barium

FLUOROSCOPY TIME:  Fluoroscopy Time:  2 minutes 36 seconds
Radiation Exposure Index (if provided by the fluoroscopic device):
Number of Acquired Spot Images: 0

[Series 1: t abdomen supine · 0.15mm/px · 1 of 1 slices shown]
[im 1/1]
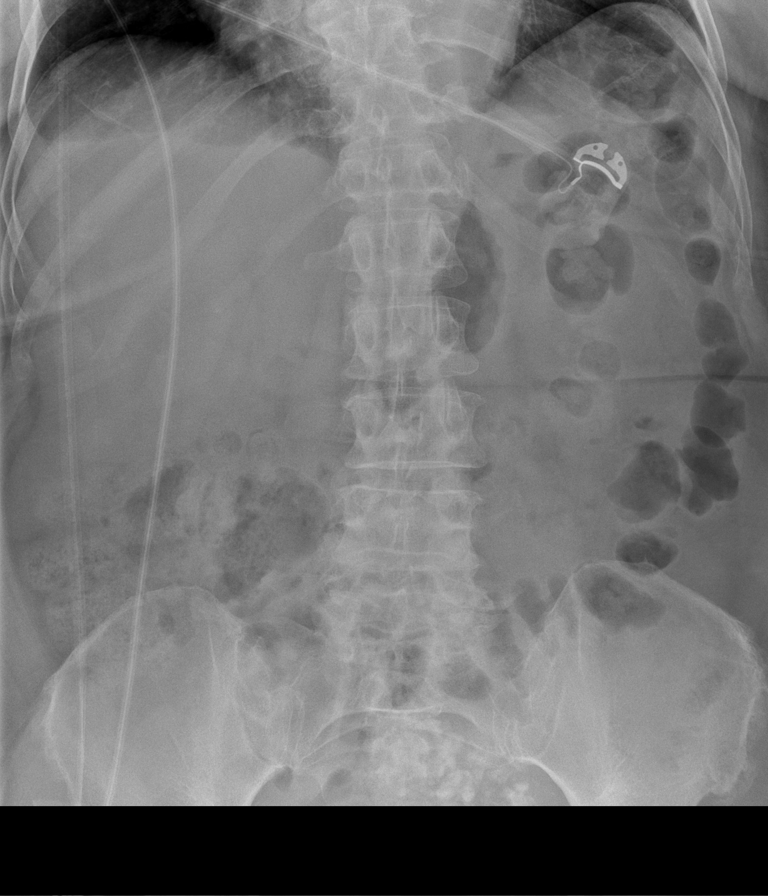

[Series 2: cp_standard · 0.53mm/px · 1 of 48 frames shown (1 of 11)]
[frame 41/48]
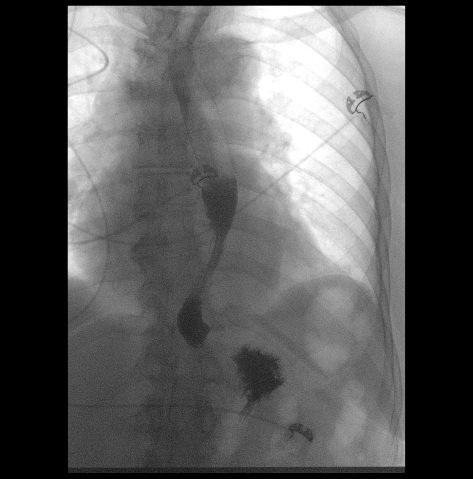

[Series 4: cp_standard · 0.53mm/px · 1 of 18 frames shown (2 of 11)]
[frame 16/18]
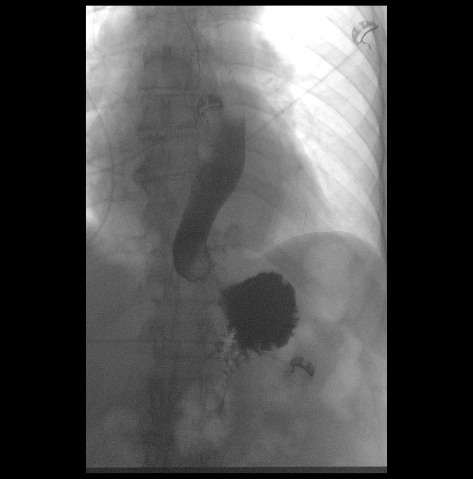

[Series 6: cp_standard · 0.35mm/px · 1 of 29 frames shown (3 of 11)]
[frame 25/29]
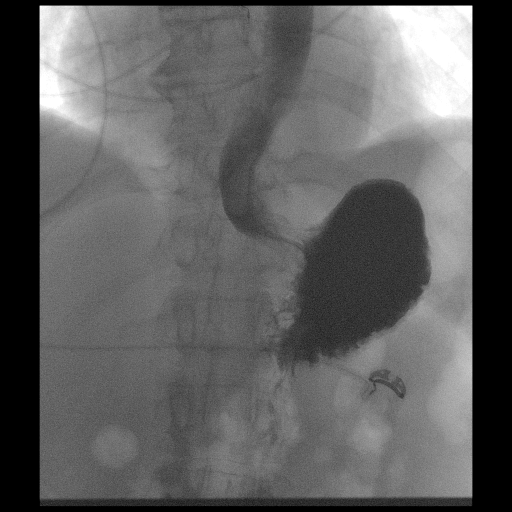

[Series 9: cp_standard · 0.35mm/px · 1 of 13 frames shown (4 of 11)]
[frame 2/13]
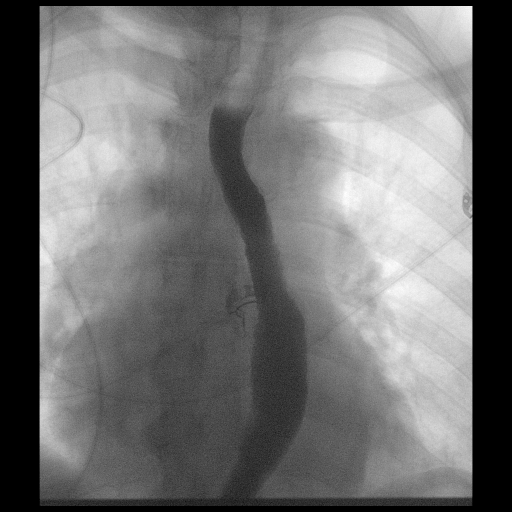

[Series 10: cp_standard · 0.18mm/px · 1 of 1 slices shown (5 of 11)]
[im 1/1]
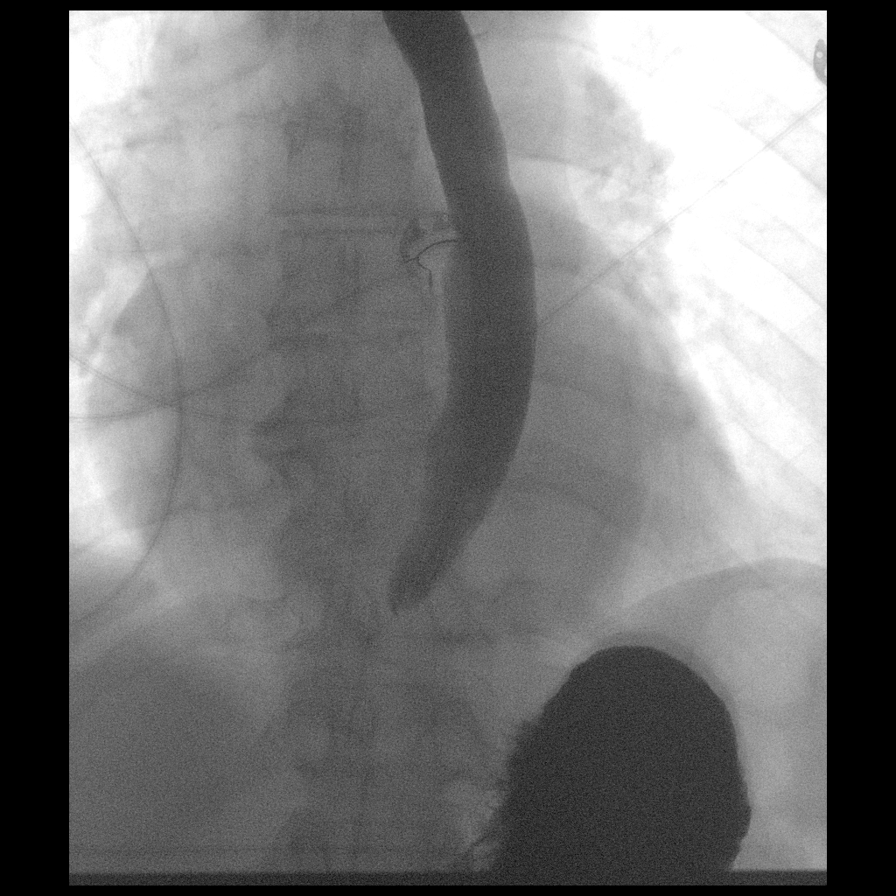

[Series 14: cp_standard · 0.36mm/px · 2 of 52 frames shown (6 of 11)]
[frame 8/52]
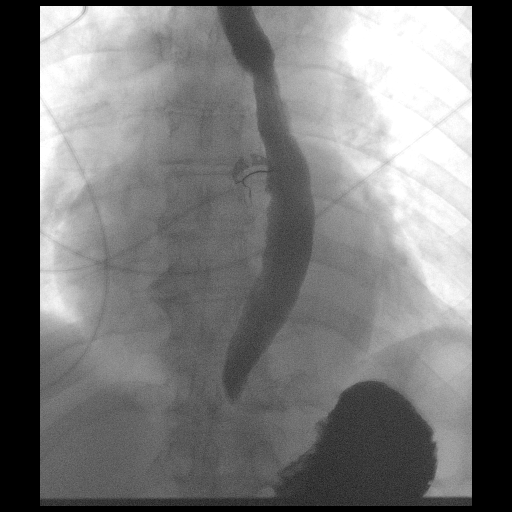
[frame 39/52]
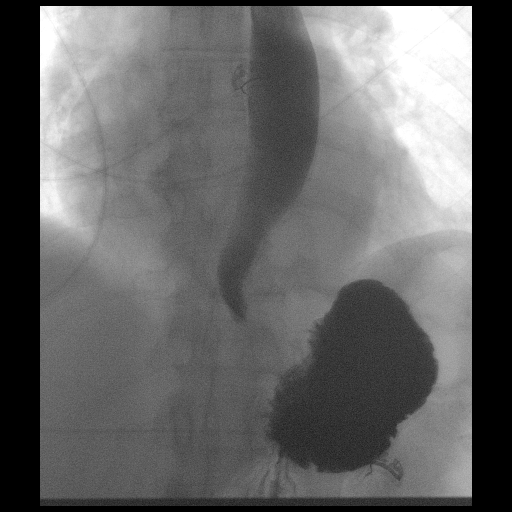

[Series 17: cp_standard · 0.36mm/px · 1 of 26 frames shown (7 of 11)]
[frame 4/26]
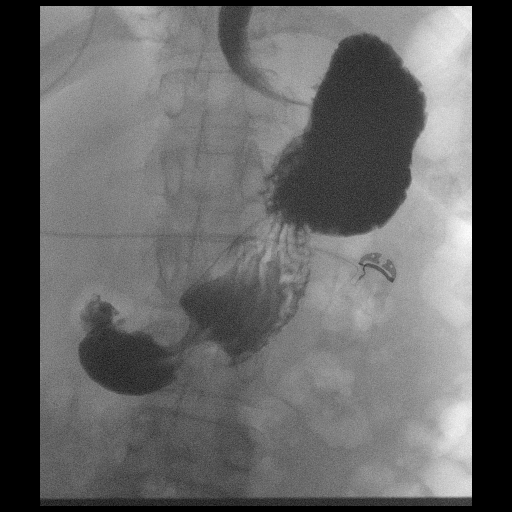

[Series 18: cp_standard · 0.36mm/px · 1 of 48 frames shown (8 of 11)]
[frame 6/48]
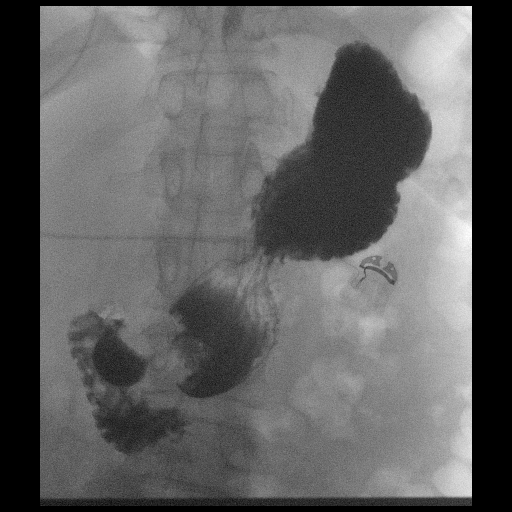

[Series 19: cp_standard · 0.18mm/px · 1 of 1 slices shown (9 of 11)]
[im 1/1]
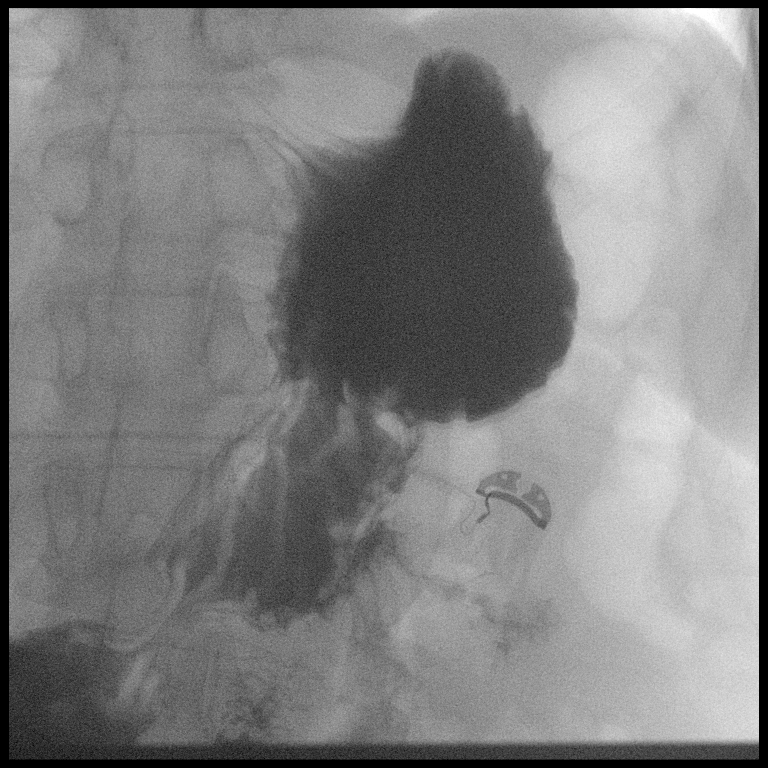

[Series 23: cp_standard · 0.18mm/px · 1 of 1 slices shown (10 of 11)]
[im 1/1]
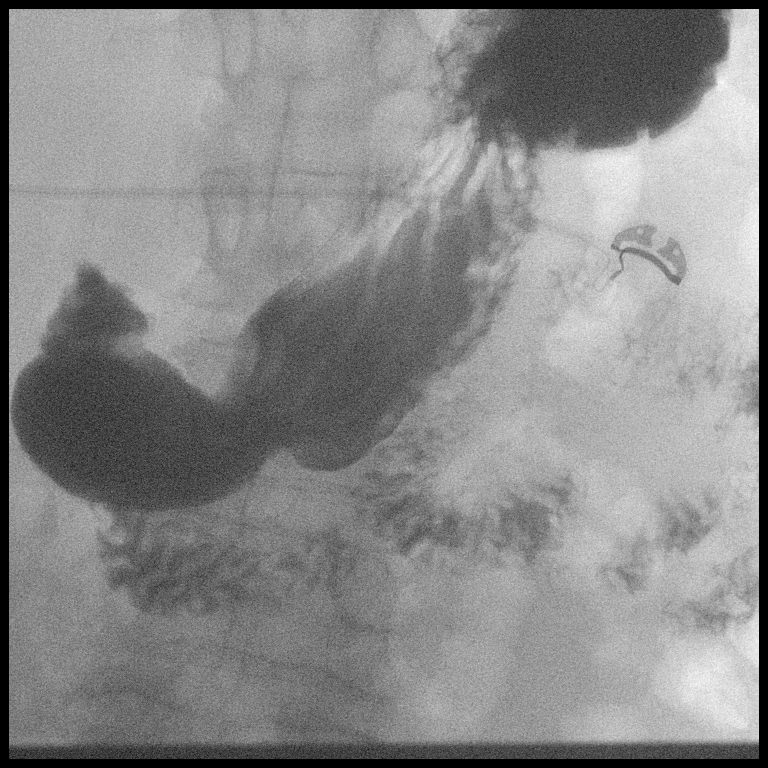

[Series 24: cp_standard · 0.36mm/px · 1 of 58 frames shown (11 of 11)]
[frame 50/58]
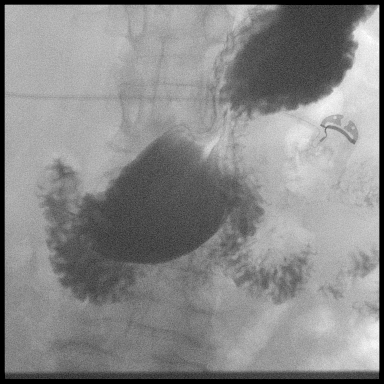

[13 of 24 positions shown; findings below may reference images not displayed]

That bleeding episode resolved spontaneously when anticoagulation
was stopped.

The patient declines an upper endoscopy, and so upper GI series is
requested.

However, due to limited mobility, a double contrast study is not
feasible.

The patient also relates a history of fibroids.

EXAM:
UPPER GI SERIES WITH KUB
FINDINGS: A single contrast study was undertaken and the patient tolerated
this well and without difficulty. Due to limited mobility, the
patient was imaged supine on the fluoroscopy table, which was
inclined at certain parts of the exam.

Preprocedural scout view of the abdomen. Normal bowel gas pattern.
Dystrophic calcifications in the pelvis which are probably a
combination of calcified fibroids and phleboliths.

No obstruction to the forward flow of contrast throughout the
esophagus and into the stomach. Normal esophageal course and
contour. Decreased esophageal motility, and frequent tertiary
contractions were noted. Stasis and to and fro motion of the
contrast in the esophagus subsequently occurred (series 9), but the
patient was asymptomatic. At the end of the study all of the
esophageal contrast had cleared.

Normal gastroesophageal junction. Normal contrast filling of the
stomach. The gastric contour and mucosal pattern appears within
normal limits allowing for the limitations of single contrast
technique. Good gastric mobility was noted. Early gastric emptying
occurred (series 17). Likewise, the duodenum course, contour, and
mucosal pattern is within normal limits.

At the conclusion of the study multiple unremarkable appearing loops
of jejunum were also opacified.

No gastroesophageal reflux occurred spontaneously or was elicited.
IMPRESSION: 1. Normal single contrast UGI appearance of the stomach and
duodenum.
2. Presbyesophagus with decreased esophageal motility resulting in
asymptomatic stasis of contrast in the esophagus which ultimately
cleared.
3. Calcified uterine fibroid suspected.

## 2021-04-14 ENCOUNTER — Encounter (HOSPITAL_BASED_OUTPATIENT_CLINIC_OR_DEPARTMENT_OTHER): Payer: Self-pay | Admitting: Family

## 2021-04-14 ENCOUNTER — Ambulatory Visit (HOSPITAL_BASED_OUTPATIENT_CLINIC_OR_DEPARTMENT_OTHER): Payer: PPO | Admitting: Family

## 2021-04-14 ENCOUNTER — Other Ambulatory Visit: Payer: Self-pay

## 2021-04-14 VITALS — BP 128/82 | HR 72 | Ht 66.0 in | Wt 221.6 lb

## 2021-04-14 DIAGNOSIS — I10 Essential (primary) hypertension: Secondary | ICD-10-CM | POA: Diagnosis not present

## 2021-04-14 DIAGNOSIS — I252 Old myocardial infarction: Secondary | ICD-10-CM

## 2021-04-14 DIAGNOSIS — I350 Nonrheumatic aortic (valve) stenosis: Secondary | ICD-10-CM

## 2021-04-14 DIAGNOSIS — Z7901 Long term (current) use of anticoagulants: Secondary | ICD-10-CM

## 2021-04-14 NOTE — Patient Instructions (Addendum)
Medication Instructions:  Continue your current medications.   *If you need a refill on your cardiac medications before your next appointment, please call your pharmacy*  Lab Work: None ordered today.   Testing/Procedures: Your EKG today showed normal sinus rhythm. This is a good result!  Your echocardiogram 02/2020 showed normal heart pumping function and mild stiffness of your aortic valve. We will monitor this with another echocardiogram in 2-3 years. If you notice new chest pain, worsening shortness of breath, or episodes of passing.   Follow-Up: At [facility], you and your health needs are our priority.  As part of our continuing mission to provide you with exceptional heart care, we have created designated Provider Care Teams.  These Care Teams include your primary Cardiologist (physician) and Advanced Practice Providers (APPs -  Physician Assistants and Nurse Practitioners) who all work together to provide you with the care you need, when you need it.  We recommend signing up for the patient portal called "MyChart".  Sign up information is provided on this After Visit Summary.  MyChart is used to connect with patients for Virtual Visits (Telemedicine).  Patients are able to view lab/test results, encounter notes, upcoming appointments, etc.  Non-urgent messages can be sent to your provider as well.   To learn more about what you can do with MyChart, go to [email or web address].    Your next appointment:   1 year(s)  The format for your next appointment:   In Person  Provider:   You may see Quay Burow, MD or one of the following Advanced Practice Providers on your designated Care Team:   [place], PA-C Coletta Memos, FNP   Other Instructions  Heart Healthy Diet Recommendations: A low-salt diet is recommended. Meats should be grilled, baked, or boiled. Avoid fried foods. Focus on lean protein sources like fish or chicken with vegetables and fruits. The American  Heart Association is a Microbiologist!    Exercise recommendations: The American Heart Association recommends 150 minutes of moderate intensity exercise weekly. Try 30 minutes of moderate intensity exercise 4-5 times per week. This could include walking, jogging, or swimming.

## 2021-04-14 NOTE — Progress Notes (Signed)
Office Visit    Patient Name: Karen Owens Date of Encounter: 04/15/2021  PCP:  Harlan Stains, MD   [facility]  Cardiologist:  Quay Burow, MD  Advanced Practice Provider:  No care team member to display Electrophysiologist:  None      Chief Complaint    Karen Owens is a 80 y.o. female with a hx of PE, chronic anticoagulation, NSTEMI with no CAD by cath, HTN, HLD presents today for shortness of breath.   Past Medical History    Past Medical History:  Diagnosis Date   Arthritis    Asthma    Complication of anesthesia    difficulty waking up   DVT (deep venous thrombosis) ([place])    Refusal of blood transfusions as patient is Jehovah's Witness    Thyroid nodule    Past Surgical History:  Procedure Laterality Date   DILATION AND CURETTAGE OF UTERUS     LEFT HEART CATH AND CORONARY ANGIOGRAPHY N/A 06/09/2018   Procedure: LEFT HEART CATH AND CORONARY ANGIOGRAPHY;  Surgeon: Lorretta Harp, MD;  Location: [facility];  Service: Cardiovascular;  Laterality: N/A;   LIPOMA EXCISION     right arm   TOTAL KNEE ARTHROPLASTY     left   TOTAL KNEE ARTHROPLASTY  08/20/2012   right knee   TOTAL KNEE ARTHROPLASTY  08/20/2012   Procedure: TOTAL KNEE ARTHROPLASTY;  Surgeon: Ninetta Lights, MD;  Location: [facility];  Service: Orthopedics;  Laterality: Right;    Allergies  Allergies  Allergen Reactions   Tuberculin Tests Hives   Other Other (See Comments)    Jehovah's Witness- NO BLOOD PRODUCTS!!    History of Present Illness    Karen Owens is a 80 y.o. female with a hx of PE, chronic anticoagulation, NSTEMI with no CAD by cath, HTN, HLD, mild aortic stenosis last seen 01/31/21 by [name], PA.  She was last seen 02/01/20 and recommended for follow up echo. Echo 02/13/20 EF 55-60%, no RWMA, gr1DD, normal PASP, mild MR, mild aortic stenosis.  She presents today for follow up. Notes no shortness of breath at rest and that her dyspnea on  exertion is improving. Tells me overall it is getting better. Reports no chest pain, pressure, or tightness. No edema, orthopnea, PND. Reports no palpitations.  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 She uses pedal exerciser for exercise. She has 3 sons and 3 daughters and recently welcomed her first great granddaughter.   EKGs/Labs/Other Studies Reviewed:   The following studies were reviewed today:  Echo 02/12/20  1. Left ventricular ejection fraction, by estimation, is 55 to 60%. The  left ventricle has normal function. The left ventricle has no regional  wall motion abnormalities. Left ventricular diastolic parameters are  consistent with Grade I diastolic  dysfunction (impaired relaxation). Elevated left atrial pressure.   2. Right ventricular systolic function is normal. The right ventricular  size is normal. There is normal pulmonary artery systolic pressure. The  estimated right ventricular systolic pressure is 123456 mmHg.   3. Left atrial size was mildly dilated.   4. The mitral valve is normal in structure. Mild mitral valve  regurgitation. No evidence of mitral stenosis.   5. The aortic valve is normal in structure. Aortic valve regurgitation is  not visualized. Mild aortic valve stenosis.   6. The inferior vena cava is normal in size with greater than 50%  respiratory variability, suggesting right atrial  pressure of 3 mmHg.   EKG:  EKG is ordered today.  The ekg ordered today demonstrates NSR 72 bpm with moderate voltage criteria for LVH and no acute ST/T wave changes.  Recent Labs: No results found for requested labs within last 8760 hours.  Recent Lipid Panel    Component Value Date/Time   CHOL 198 07/13/2019 0745   TRIG 29 07/13/2019 0745   HDL 55 07/13/2019 0745   CHOLHDL 3.6 07/13/2019 0745   VLDL 6 07/13/2019 0745   LDLCALC 137 (H) 07/13/2019 0745    Home Medications   Current Meds  Medication Sig   apixaban (ELIQUIS) 5 MG TABS tablet Take 1 tablet (5  mg total) by mouth 2 (two) times daily.   APPLE CIDER VINEGAR PO Take 250 mg by mouth daily.   carboxymethylcellulose (REFRESH TEARS) 0.5 % SOLN Place 1 drop into both eyes 2 (two) times daily.   Coenzyme Q10 (CO Q 10 PO) Take 1 tablet by mouth daily.   hydrochlorothiazide (MICROZIDE) 12.5 MG capsule Take 12.5 mg by mouth every other day.   ibuprofen (ADVIL,MOTRIN) 800 MG tablet Take 800 mg by mouth every 8 (eight) hours as needed (inflammation/pain).    nitroGLYCERIN (NITROSTAT) 0.4 MG SL tablet Place 1 tablet (0.4 mg total) under the tongue every 5 (five) minutes x 3 doses as needed.   Nutritional Supplements (IMMUNOCAL PO) Take 1 packet by mouth daily. Mix in juice and drink   thiamine (VITAMIN B-1) 100 MG tablet Take 100 mg by mouth daily.   Vitamin D, Ergocalciferol, (DRISDOL) 1.25 MG (50000 UNIT) CAPS capsule Take 50,000 Units by mouth every 7 (seven) days.     Review of Systems      All other systems reviewed and are otherwise negative except as noted above.  Physical Exam    VS:  BP 128/82 (BP Location: Left Arm, Patient Position: Sitting, Cuff Size: Large)   Pulse 72   Ht '5\' 6"'$  (1.676 m)   Wt 221 lb 9.6 oz (100.5 kg)   BMI 35.77 kg/m  , BMI Body mass index is 35.77 kg/m.  Wt Readings from Last 3 Encounters:  04/14/21 221 lb 9.6 oz (100.5 kg)  02/01/20 233 lb 6.4 oz (105.9 kg)  08/14/19 232 lb 9.6 oz (105.5 kg)     GEN: Well nourished, well developed, in no acute distress. HEENT: normal. Neck: Supple, no JVD, carotid bruits, or masses. Cardiac: RRR, no  rubs, or gallops. Gr 2/6 systolic murmur. No clubbing, cyanosis, edema.  Radials/PT 2+ and equal bilaterally.  Respiratory:  Respirations regular and unlabored, clear to auscultation bilaterally. GI: Soft, nontender, nondistended. MS: No deformity or atrophy. Skin: Warm and dry, no rash. Neuro:  Strength and sensation are intact. Psych: Normal affect.  Assessment & Plan    History of pulmonary embolism - On  anticoagulation with Eliquis per primary care provider. Denies bleeding complications.   History of NSTEMI 05/2018 - Cath at that time with no coronary disease. Stable with no anginal symptoms. No indication for ischemic evaluation.    Patient is Jehovah's witness - no blood products  HTN - BP well controlled. Continue current antihypertensive regimen.    Aortic stenosis - Mild by echo 02/2020. Denies chest pain, shortness of breath, syncope. Educated to report new symptoms. Consider repeat echo in 2-3 years.   HLD - Has previously declined statin.   Disposition: Follow up in 1 year(s) with Dr. Gwenlyn Found or APP.  Signed, Loel Dubonnet, NP 04/15/2021, 12:27 AM  [facility]

## 2021-06-23 DIAGNOSIS — Z86711 Personal history of pulmonary embolism: Secondary | ICD-10-CM | POA: Diagnosis not present

## 2021-06-23 DIAGNOSIS — I10 Essential (primary) hypertension: Secondary | ICD-10-CM | POA: Diagnosis not present

## 2021-06-23 DIAGNOSIS — Z23 Encounter for immunization: Secondary | ICD-10-CM | POA: Diagnosis not present

## 2021-06-23 DIAGNOSIS — I2510 Atherosclerotic heart disease of native coronary artery without angina pectoris: Secondary | ICD-10-CM | POA: Diagnosis not present

## 2021-06-23 DIAGNOSIS — E785 Hyperlipidemia, unspecified: Secondary | ICD-10-CM | POA: Diagnosis not present

## 2021-06-23 DIAGNOSIS — Z7901 Long term (current) use of anticoagulants: Secondary | ICD-10-CM | POA: Diagnosis not present

## 2021-07-27 DIAGNOSIS — Z87891 Personal history of nicotine dependence: Secondary | ICD-10-CM | POA: Diagnosis not present

## 2021-07-27 DIAGNOSIS — H6123 Impacted cerumen, bilateral: Secondary | ICD-10-CM | POA: Diagnosis not present

## 2021-07-27 DIAGNOSIS — H6121 Impacted cerumen, right ear: Secondary | ICD-10-CM | POA: Diagnosis not present

## 2021-07-27 DIAGNOSIS — R430 Anosmia: Secondary | ICD-10-CM | POA: Insufficient documentation

## 2021-08-09 ENCOUNTER — Telehealth: Payer: Self-pay | Admitting: Cardiovascular Disease

## 2021-08-09 NOTE — Telephone Encounter (Signed)
Spoke with patient about her concerns on getting teeth cleaned and whitened. Recommended that patient advise her dentist of the medications she is taking when she goes for her cleaning. Also recommended she check with her orthopedic surgeon to check if any prophylactic ABX is needed. She voiced understanding of the conversation.

## 2021-08-09 NOTE — Telephone Encounter (Signed)
Patient states she would like to get her teeth cleaned and whitened and would like to know if that is okay. She says her dentist office is not asking for clearance, she is just wanting to know herself.

## 2021-10-03 DIAGNOSIS — H25813 Combined forms of age-related cataract, bilateral: Secondary | ICD-10-CM | POA: Diagnosis not present

## 2021-10-03 DIAGNOSIS — H04123 Dry eye syndrome of bilateral lacrimal glands: Secondary | ICD-10-CM | POA: Diagnosis not present

## 2021-10-03 DIAGNOSIS — H1011 Acute atopic conjunctivitis, right eye: Secondary | ICD-10-CM | POA: Diagnosis not present

## 2021-10-03 DIAGNOSIS — H11821 Conjunctivochalasis, right eye: Secondary | ICD-10-CM | POA: Diagnosis not present

## 2021-10-16 DIAGNOSIS — H903 Sensorineural hearing loss, bilateral: Secondary | ICD-10-CM | POA: Diagnosis not present

## 2021-10-16 DIAGNOSIS — S0232XA Fracture of orbital floor, left side, initial encounter for closed fracture: Secondary | ICD-10-CM | POA: Diagnosis not present

## 2021-10-16 DIAGNOSIS — J342 Deviated nasal septum: Secondary | ICD-10-CM | POA: Diagnosis not present

## 2021-10-16 DIAGNOSIS — R430 Anosmia: Secondary | ICD-10-CM | POA: Diagnosis not present

## 2021-10-31 DIAGNOSIS — Z0100 Encounter for examination of eyes and vision without abnormal findings: Secondary | ICD-10-CM | POA: Diagnosis not present

## 2021-11-08 DIAGNOSIS — Z20822 Contact with and (suspected) exposure to covid-19: Secondary | ICD-10-CM | POA: Diagnosis not present

## 2022-02-08 ENCOUNTER — Telehealth: Payer: Self-pay | Admitting: Cardiovascular Disease

## 2022-02-08 NOTE — Telephone Encounter (Signed)
Returned the call to the patient. She stated that she was having occasional chest tightness and shortness of breath on exertion. This started about a week ago. She stated that she walks on a regular basis for exercise and lately she has been having shortness of breath and some chest tightness. She denies this at rest.  She stated that she has been feeling better lately and has not had the symptoms in the past few days but she would still like to be checked out.  Appointment made with Coletta Memos, NP on 6/6.  Patient made aware of ED precautions should new or worsening symptoms occur. Patient verbalized understanding.

## 2022-02-08 NOTE — Telephone Encounter (Signed)
Pt c/o of Chest Pain: STAT if CP now or developed within 24 hours  1. Are you having CP right now? no  2. Are you experiencing any other symptoms (ex. SOB, nausea, vomiting, sweating)? SOB with activity  3. How long have you been experiencing CP? About two weeks  4. Is your CP continuous or coming and going? Comes and goes, but it feels more like a chest pressure on her left side   5. Have you taken Nitroglycerin? no  Pt c/o Shortness Of Breath: STAT if SOB developed within the last 24 hours or pt is noticeably SOB on the phone  1. Are you currently SOB (can you hear that pt is SOB on the phone)? no  2. How long have you been experiencing SOB? About two weeks, the same time the patient noticed the feels   3. Are you SOB when sitting or when up moving around? Up and moving around  4. Are you currently experiencing any other symptoms? No  Patient felt that she should come in and be

## 2022-02-11 NOTE — Progress Notes (Unsigned)
Cardiology Clinic Note   Patient Name: Karen Owens Date of Encounter: 02/13/2022  Primary Care Provider:  Harlan Stains, MD Primary Cardiologist:  Quay Burow, MD  Patient Profile    Karen Owens is a 81 year old female presents to the clinic today for follow-up evaluation of her shortness of breath.  Past Medical History    Past Medical History:  Diagnosis Date   Arthritis    Asthma    Complication of anesthesia    difficulty waking up   DVT (deep venous thrombosis) ([place])    Refusal of blood transfusions as patient is Jehovah's Witness    Thyroid nodule    Past Surgical History:  Procedure Laterality Date   DILATION AND CURETTAGE OF UTERUS     LEFT HEART CATH AND CORONARY ANGIOGRAPHY N/A 06/09/2018   Procedure: LEFT HEART CATH AND CORONARY ANGIOGRAPHY;  Surgeon: Lorretta Harp, MD;  Location: [facility];  Service: Cardiovascular;  Laterality: N/A;   LIPOMA EXCISION     right arm   TOTAL KNEE ARTHROPLASTY     left   TOTAL KNEE ARTHROPLASTY  08/20/2012   right knee   TOTAL KNEE ARTHROPLASTY  08/20/2012   Procedure: TOTAL KNEE ARTHROPLASTY;  Surgeon: Ninetta Lights, MD;  Location: [facility];  Service: Orthopedics;  Laterality: Right;    Allergies  Allergies  Allergen Reactions   Tuberculin Tests Hives   Other Other (See Comments)    Jehovah's Witness- NO BLOOD PRODUCTS!!    History of Present Illness    Karen Owens has a PMH of DVT, pulmonary embolus, Prinzmetal angina, essential hypertension, asthma, anxiety, hyperlipidemia, NSTEMI, PE, and is a Jehovah's Witness.  She underwent cardiac catheterization which showed no coronary artery disease 06/09/2018.  She was seen by Laurann Montana, NP-C 04/14/2021.  She noted no shortness of breath at rest.  She indicated that her dyspnea on exertion was improving.  She felt that she was overall better.  She denied chest pain, pressure, and tightness.  She had no lower extremity swelling orthopnea or PND.  She  denied palpitations.  She was using a pedal exerciser.  She has 3 sons and 3 daughters and was happy to be a great grandmother.  (Recent birth of first great grandchild)  She contacted the nurse triage line on 02/08/2022.  She reported occasional episodes of chest tightness and shortness of breath.  The episodes started about 1 week prior.  She noted that she has been walking regularly and reported the episodes had dissipated over the last few days however she would still like to be seen.  She presents to the clinic today for evaluation and states she has had 2 episodes of chest pressure.  The first episode happened after a call that included stressful news about her handicapped son.  Another episode happened after a climbing up a set of stairs.  Both episodes resolved spontaneously after drinking cool water.  We reviewed her cardiac catheterization and previous echocardiogram.  She asked about antianxiety medication (Xanax).  I will give her the mindfulness stress reduction sheet, salty 6 diet sheet, continue her current medication regimen, and plan follow-up in 4 to 6 months.  I reassured her that her chest pressure did not appear to be related to cardiac issues.  Today she denies chest pain, shortness of breath, lower extremity edema, fatigue, palpitations, melena, hematuria, hemoptysis, diaphoresis, weakness, presyncope, syncope, orthopnea, and PND.   Home Medications    Prior to Admission medications  Medication Sig Start Date End Date Taking? Authorizing Provider  apixaban (ELIQUIS) 5 MG TABS tablet Take 1 tablet (5 mg total) by mouth 2 (two) times daily. 07/22/19   Shelly Coss, MD  APPLE CIDER VINEGAR PO Take 250 mg by mouth daily.    [provider]  carboxymethylcellulose (REFRESH TEARS) 0.5 % SOLN Place 1 drop into both eyes 2 (two) times daily.    [provider]  Coenzyme Q10 (CO Q 10 PO) Take 1 tablet by mouth daily.    [provider]  hydrochlorothiazide  (MICROZIDE) 12.5 MG capsule Take 12.5 mg by mouth every other day.    [provider]  ibuprofen (ADVIL,MOTRIN) 800 MG tablet Take 800 mg by mouth every 8 (eight) hours as needed (inflammation/pain).  05/05/18   [provider]  nitroGLYCERIN (NITROSTAT) 0.4 MG SL tablet Place 1 tablet (0.4 mg total) under the tongue every 5 (five) minutes x 3 doses as needed. 07/09/18   Lorretta Harp, MD  Nutritional Supplements (IMMUNOCAL PO) Take 1 packet by mouth daily. Mix in juice and drink    [provider]  thiamine (VITAMIN B-1) 100 MG tablet Take 100 mg by mouth daily.    [provider]  Vitamin D, Ergocalciferol, (DRISDOL) 1.25 MG (50000 UNIT) CAPS capsule Take 50,000 Units by mouth every 7 (seven) days.    [provider]    Family History    Family History  Problem Relation Age of Onset   Heart attack Brother    She indicated that her brother is alive.  Social History    Social History   Socioeconomic History   Marital status: Divorced    Spouse name: Not on file   Number of children: Not on file   Years of education: Not on file   Highest education level: Not on file  Occupational History   Not on file  Tobacco Use   Smoking status: Former    Types: Cigarettes    Quit date: 09/10/1982    Years since quitting: 39.4   Smokeless tobacco: Never  Substance and Sexual Activity   Alcohol use: Yes    Comment: occasional   Drug use: No   Sexual activity: Never    Birth control/protection: Post-menopausal  Other Topics Concern   Not on file  Social History Narrative   Not on file   Social Determinants of Health   Financial Resource Strain: Not on file  Food Insecurity: Not on file  Transportation Needs: Not on file  Physical Activity: Not on file  Stress: Not on file  Social Connections: Not on file  Intimate Partner Violence: Not on file     Review of Systems    General:  No chills, fever, night sweats or weight changes.   Cardiovascular:  No chest pain, dyspnea on exertion, edema, orthopnea, palpitations, paroxysmal nocturnal dyspnea. Dermatological: No rash, lesions/masses Respiratory: No cough, dyspnea Urologic: No hematuria, dysuria Abdominal:   No nausea, vomiting, diarrhea, bright red blood per rectum, melena, or hematemesis Neurologic:  No visual changes, wkns, changes in mental status. All other systems reviewed and are otherwise negative except as noted above.  Physical Exam    VS:  BP 132/82   Pulse 72   Ht '5\' 6"'$  (1.676 m)   Wt 216 lb 6.4 oz (98.2 kg)   SpO2 99%   BMI 34.93 kg/m  , BMI Body mass index is 34.93 kg/m. GEN: Well nourished, well developed, in no acute distress. HEENT: normal.  Neck: Supple, no JVD, carotid bruits, or masses. Cardiac: RRR, no murmurs, rubs, or gallops. No clubbing, cyanosis, edema.  Radials/DP/PT 2+ and equal bilaterally.  Respiratory:  Respirations regular and unlabored, clear to auscultation bilaterally. GI: Soft, nontender, nondistended, BS + x 4. MS: no deformity or atrophy. Skin: warm and dry, no rash. Neuro:  Strength and sensation are intact. Psych: Normal affect.  Accessory Clinical Findings    Recent Labs: No results found for requested labs within last 8760 hours.   Recent Lipid Panel    Component Value Date/Time   CHOL 198 07/13/2019 0745   TRIG 29 07/13/2019 0745   HDL 55 07/13/2019 0745   CHOLHDL 3.6 07/13/2019 0745   VLDL 6 07/13/2019 0745   LDLCALC 137 (H) 07/13/2019 0745    ECG personally reviewed by me today-normal sinus rhythm left axis deviation moderate voltage criteria for LVH 72 bpm- No acute changes  Cardiac catheterization 06/09/2018  History obtained from chart review.81 y.o. female with small NSTEMI (inferolateral hypokinesis, preserved LVEF, peak troponin 1.0).  She presents now for diagnostic coronary angiography via the right radial approach.     IMPRESSION: Ms. Whitebread has essentially normal coronary arteries and a  codominant system with an LVEDP of 18.  Medical therapy will be recommended for "noncardiac chest pain" although coronary vasospasm could also be a possibility.  The patient does admit to a lot of recent stress.  The sheath was removed and a TR band was placed on the right wrist to achieve patent hemostasis.  The patient left the lab in stable condition.  She can probably be discharged home later this morning.   Quay Burow. MD, [facility] 06/09/2018 8:26 AM  Diagnostic Dominance: Co-dominant  Assessment & Plan   1.  DOE, chest pressure-denies episodes of shortness of breath with exertion since contacting nurse triage line.  History of pulmonary embolus.  Denies missed episodes of apixaban.  Denies bleeding issues.  Chest pressure was in the setting of stressful news about her son.  Resolved after she drank cool water. Continue apixaban Increase physical activity as tolerated Mental stress reduction sheet given  NSTEMI-underwent cardiac catheterization 9/19 and was not noted to have coronary disease. Heart healthy low-sodium diet-salty 6 given Increase physical activity as tolerated  Hyperlipidemia-LDL 149.  Previously wished to defer statin therapy. Heart healthy low-sodium high-fiber diet Increase physical activity as tolerated  Essential hypertension-BP today 132/82.  Well-controlled at home. Continue hydrochlorothiazide Heart healthy low-sodium diet-salty 6 given Increase physical activity as tolerated  Aortic stenosis-noted to have mild aortic stenosis on echocardiogram 6/21.  Denies increased DOE or activity intolerance.  Given recent episode of shortness of breath and history of aortic stenosis we will repeat echocardiogram.  Disposition: Follow-up with Dr. Gwenlyn Found or me in 4-6 months.  Jossie Ng. Fannye Myer NP-C    02/13/2022, 2:17 PM [facility] [place] [address] Office [phone] Fax [phone]  Notice: This dictation was prepared with  Dragon dictation along with smaller phrase technology. Any transcriptional errors that result from this process are unintentional and may not be corrected upon review.  I spent 14 minutes examining this patient, reviewing medications, and using patient centered shared decision making involving her cardiac care.  Prior to her visit I spent greater than 20 minutes reviewing her past medical history,  medications, and prior cardiac tests.

## 2022-02-13 ENCOUNTER — Ambulatory Visit: Payer: Medicare HMO | Admitting: General Practice

## 2022-02-13 ENCOUNTER — Encounter: Payer: Self-pay | Admitting: General Practice

## 2022-02-13 VITALS — BP 132/82 | HR 72 | Ht 66.0 in | Wt 216.4 lb

## 2022-02-13 DIAGNOSIS — R0609 Other forms of dyspnea: Secondary | ICD-10-CM | POA: Diagnosis not present

## 2022-02-13 DIAGNOSIS — I350 Nonrheumatic aortic (valve) stenosis: Secondary | ICD-10-CM

## 2022-02-13 DIAGNOSIS — I252 Old myocardial infarction: Secondary | ICD-10-CM | POA: Diagnosis not present

## 2022-02-13 DIAGNOSIS — I10 Essential (primary) hypertension: Secondary | ICD-10-CM

## 2022-02-13 DIAGNOSIS — E782 Mixed hyperlipidemia: Secondary | ICD-10-CM

## 2022-02-13 NOTE — Patient Instructions (Addendum)
Medication Instructions:  The current medical regimen is effective;  continue present plan and medications as directed. Please refer to the Current Medication list given to you today.   *If you need a refill on your cardiac medications before your next appointment, please call your pharmacy*  Lab Work:   Testing/Procedures:  NONE    NONE  If you have labs (blood work) drawn today and your tests are completely normal, you will receive your results only by: [place] (if you have MyChart) OR  A paper copy in the mail If you have any lab test that is abnormal or we need to change your treatment, we will call you to review the results.  Special Instructions PLEASE READ AND FOLLOW SALTY 6-ATTACHED-1,'800mg'$  daily  PLEASE READ STRESS REDUCTION TIPS-ATTACHED  Follow-Up: Your next appointment:  4-6 month(s) In Person with Quay Burow, MD  or Coletta Memos, FNP      At [facility], you and your health needs are our priority.  As part of our continuing mission to provide you with exceptional heart care, we have created designated Provider Care Teams.  These Care Teams include your primary Cardiologist (physician) and Advanced Practice Providers (APPs -  Physician Assistants and Nurse Practitioners) who all work together to provide you with the care you need, when you need it.   Important Information About Sugar               6 SALTY THINGS TO AVOID     1,'800MG'$  DAILY     Mindfulness-Based Stress Reduction Mindfulness-based stress reduction (MBSR) is a program that helps people learn to practice mindfulness. Mindfulness is the practice of consciously paying attention to the present moment. MBSR focuses on developing self-awareness, which lets you respond to life stress without judgment or negative feelings. It can be learned and practiced through techniques such as education, breathing exercises, meditation, and yoga. MBSR includes several mindfulness techniques in one  program. MBSR works best when you understand the treatment, are willing to try new things, and can commit to spending time practicing what you learn. MBSR training may include learning about: How your feelings, thoughts, and reactions affect your body. New ways to respond to things that cause negative thoughts to start (triggers). How to notice your thoughts and let go of them. Practicing awareness of everyday things that you normally do without thinking. The techniques and goals of different types of meditation. What are the benefits of MBSR? MBSR can have many benefits, which include helping you to: Develop self-awareness. This means knowing and understanding yourself. Learn skills and attitudes that help you to take part in your own health care. Learn new ways to care for yourself. Be more accepting about how things are, and let things go. Be less judgmental and approach things with an open mind. Be patient with yourself and trust yourself more. MBSR has also been shown to: Reduce negative emotions, such as sadness, overwhelm, and worry. Improve memory and focus. Change how you sense and react to pain. Boost your body's ability to fight infections. Help you connect better with other people. Improve your sense of well-being. How to practice mindfulness To do a basic awareness exercise: Find a comfortable place to sit. Pay attention to the present moment. Notice your thoughts, feelings, and surroundings just as they are. Avoid judging yourself, your feelings, or your surroundings. Make note of any judgment that comes up and let it go. Your mind may wander, and that is okay. Make note of  when your thoughts drift, and return your attention to the present moment. To do basic mindfulness meditation: Find a comfortable place to sit. This may include a stable chair or a firm floor cushion. Sit upright with your back straight. Let your arms fall next to your sides, with your hands resting on  your legs. If you are sitting in a chair, rest your feet flat on the floor. If you are sitting on a cushion, cross your legs in front of you. Keep your head in a neutral position with your chin dropped slightly. Relax your jaw and rest the tip of your tongue on the roof of your mouth. Drop your gaze to the floor or close your eyes. Breathe normally and pay attention to your breath. Feel the air moving in and out of your nose. Feel your belly expanding and relaxing with each breath. Your mind may wander, and that is okay. Make note of when your thoughts drift, and return your attention to your breath. Avoid judging yourself, your feelings, or your surroundings. Make note of any judgment or feelings that come up, let them go, and bring your attention back to your breath. When you are ready, lift your gaze or open your eyes. Pay attention to how your body feels after the meditation. Follow these instructions at home:  Find a local in-person or online MBSR program. Set aside some time regularly for mindfulness practice. Practice every day if you can. Even 10 minutes of practice is helpful. Find a mindfulness practice that works best for you. This may include one or more of the following: Meditation. This involves focusing your mind on a certain thought or activity. Breathing awareness exercises. These help you to stay present by focusing on your breath. Body scan. For this practice, you lie down and pay attention to each part of your body from head to toe. You can identify tension and soreness and consciously relax parts of your body. Yoga. Yoga involves stretching and breathing, and it can improve your ability to move and be flexible. It can also help you to test your body's limits, which can help you release stress. Mindful eating. This way of eating involves focusing on the taste, texture, color, and smell of each bite of food. This slows down eating and helps you feel full sooner. For this reason,  it can be an important part of a weight loss plan. Find a podcast or recording that provides guidance for breathing awareness, body scan, or meditation exercises. You can listen to these any time when you have a free moment to rest without distractions. Follow your treatment plan as told by your health care provider. This may include taking regular medicines and making changes to your diet or lifestyle as recommended. Where to find more information You can find more information about MBSR from: Your health care provider. Community-based meditation centers or programs. Programs offered near you. Summary Mindfulness-based stress reduction (MBSR) is a program that teaches you how to consciously pay attention to the present moment. It is used to help you deal better with daily stress, feelings, and pain. MBSR focuses on developing self-awareness, which allows you to respond to life stress without judgment or negative feelings. MBSR programs may involve learning different mindfulness practices, such as breathing exercises, meditation, yoga, body scan, or mindful eating. Find a mindfulness practice that works best for you, and set aside time for it on a regular basis. This information is not intended to replace advice given to you by  your health care provider. Make sure you discuss any questions you have with your health care provider. Document Revised: 04/06/2021 Document Reviewed: 04/06/2021 Elsevier Patient Education  [place].

## 2022-04-05 DIAGNOSIS — H1011 Acute atopic conjunctivitis, right eye: Secondary | ICD-10-CM | POA: Diagnosis not present

## 2022-04-05 DIAGNOSIS — H43821 Vitreomacular adhesion, right eye: Secondary | ICD-10-CM | POA: Diagnosis not present

## 2022-04-05 DIAGNOSIS — H25813 Combined forms of age-related cataract, bilateral: Secondary | ICD-10-CM | POA: Diagnosis not present

## 2022-04-05 DIAGNOSIS — H11821 Conjunctivochalasis, right eye: Secondary | ICD-10-CM | POA: Diagnosis not present

## 2022-04-05 DIAGNOSIS — H04123 Dry eye syndrome of bilateral lacrimal glands: Secondary | ICD-10-CM | POA: Diagnosis not present

## 2022-04-12 DIAGNOSIS — H9113 Presbycusis, bilateral: Secondary | ICD-10-CM | POA: Insufficient documentation

## 2022-04-12 DIAGNOSIS — H6123 Impacted cerumen, bilateral: Secondary | ICD-10-CM | POA: Diagnosis not present

## 2022-04-27 ENCOUNTER — Other Ambulatory Visit: Payer: Self-pay

## 2022-04-27 NOTE — Patient Outreach (Signed)
[facility]) Care Management  04/27/2022  Karen Owens Jan 11, 1941 [id number]   Telephone call to patient for nurse call.  No answer.  HIPAA compliant voice message left.    Plan: RN CM will attempt again within 4 business days and send letter.  Jone Baseman, RN, MSN [facility] Care Management Care Management Coordinator Direct Line [phone] Toll Free: [phone]  Fax: [phone]

## 2022-04-30 ENCOUNTER — Telehealth: Payer: Self-pay | Admitting: *Deleted

## 2022-04-30 NOTE — Patient Outreach (Signed)
  Care Coordination   Initial Visit Note   04/30/2022 Name: Karen Owens MRN: [id number] DOB: 05/25/1941  Karen Owens is a 81 y.o. year old female who sees Harlan Stains, MD for primary care. I spoke with  Karen Owens by phone today  What matters to the patients health and wellness today?  Declines Care Coordination program at this time.      Goals Addressed   None     SDOH assessments and interventions completed:  No     Care Coordination Interventions Activated:  No  Care Coordination Interventions:  No, not indicated   Follow up plan: No further intervention required.   Encounter Outcome:  Pt. Refused

## 2022-06-10 NOTE — Progress Notes (Unsigned)
Cardiology Clinic Note   Patient Name: Karen Owens Date of Encounter: 06/12/2022  Primary Care Provider:  Harlan Stains, MD Primary Cardiologist:  Quay Burow, MD  Patient Profile    Karen Owens is a 81 year old female presents to the clinic today for follow-up evaluation of her shortness of breath.  Past Medical History    Past Medical History:  Diagnosis Date   Arthritis    Asthma    Complication of anesthesia    difficulty waking up   DVT (deep venous thrombosis) ([place])    Refusal of blood transfusions as patient is Jehovah's Witness    Thyroid nodule    Past Surgical History:  Procedure Laterality Date   DILATION AND CURETTAGE OF UTERUS     LEFT HEART CATH AND CORONARY ANGIOGRAPHY N/A 06/09/2018   Procedure: LEFT HEART CATH AND CORONARY ANGIOGRAPHY;  Surgeon: Lorretta Harp, MD;  Location: [facility];  Service: Cardiovascular;  Laterality: N/A;   LIPOMA EXCISION     right arm   TOTAL KNEE ARTHROPLASTY     left   TOTAL KNEE ARTHROPLASTY  08/20/2012   right knee   TOTAL KNEE ARTHROPLASTY  08/20/2012   Procedure: TOTAL KNEE ARTHROPLASTY;  Surgeon: Ninetta Lights, MD;  Location: [facility];  Service: Orthopedics;  Laterality: Right;    Allergies  Allergies  Allergen Reactions   Tuberculin Tests Hives   Other Other (See Comments)    Jehovah's Witness- NO BLOOD PRODUCTS!!    History of Present Illness    Karen Owens has a PMH of DVT, pulmonary embolus, Prinzmetal angina, essential hypertension, asthma, anxiety, hyperlipidemia, NSTEMI, PE, and is a Jehovah's Witness.  She underwent cardiac catheterization which showed no coronary artery disease 06/09/2018.  She was seen by Laurann Montana, NP-C 04/14/2021.  She noted no shortness of breath at rest.  She indicated that her dyspnea on exertion was improving.  She felt that she was overall better.  She denied chest pain, pressure, and tightness.  She had no lower extremity swelling orthopnea or PND.  She  denied palpitations.  She was using a pedal exerciser.  She has 3 sons and 3 daughters and was happy to be a great grandmother.  (Recent birth of first great grandchild)  She contacted the nurse triage line on 02/08/2022.  She reported occasional episodes of chest tightness and shortness of breath.  The episodes started about 1 week prior.  She noted that she has been walking regularly and reported the episodes had dissipated over the last few days however she would still like to be seen.  She presented to the clinic 02/13/22 for evaluation and stated she  had 2 episodes of chest pressure.  The first episode happened after a call that included stressful news about her handicapped son.  Another episode happened after a climbing up a set of stairs.  Both episodes resolved spontaneously after drinking cool water.  We reviewed her cardiac catheterization and previous echocardiogram.  She asked about antianxiety medication (Xanax).  I  gave her the mindfulness stress reduction sheet, salty 6 diet sheet, continued her current medication regimen, and planned follow-up in 4 to 6 months.  I reassured her that her chest pressure did not appear to be related to cardiac issues.  She presents to the clinic today for follow up evaluation and states she continues to do well from a heart standpoint today.  She continues to notice some increased work of breathing with increased physical activity  such as climbing stairs.  She has normal breathing with normal activities.  She continues to follow a reduced sodium diet.  Her blood pressure is well controlled today.  We reviewed her previous cardiac catheterization and echocardiogram again.  She expressed understanding.  She did notice some funny feeling in her chest 2 weeks ago.  It lasted for few seconds and resolved on its own.  She reports that she was able to drink some cool water and the symptoms went away.  We reviewed possible causes for the discomfort and she was reassured  that it was not related to her heart.  She remains somewhat physically active walking door-to-door to talk about [place].  We will plan follow-up in 6 to 9 months.  Today she denies chest pain, shortness of breath, lower extremity edema, fatigue, palpitations, melena, hematuria, hemoptysis, diaphoresis, weakness, presyncope, syncope, orthopnea, and PND.   Home Medications    Prior to Admission medications   Medication Sig Start Date End Date Taking? Authorizing Provider  apixaban (ELIQUIS) 5 MG TABS tablet Take 1 tablet (5 mg total) by mouth 2 (two) times daily. 07/22/19   Shelly Coss, MD  APPLE CIDER VINEGAR PO Take 250 mg by mouth daily.    [provider]  carboxymethylcellulose (REFRESH TEARS) 0.5 % SOLN Place 1 drop into both eyes 2 (two) times daily.    [provider]  Coenzyme Q10 (CO Q 10 PO) Take 1 tablet by mouth daily.    [provider]  hydrochlorothiazide (MICROZIDE) 12.5 MG capsule Take 12.5 mg by mouth every other day.    [provider]  ibuprofen (ADVIL,MOTRIN) 800 MG tablet Take 800 mg by mouth every 8 (eight) hours as needed (inflammation/pain).  05/05/18   [provider]  nitroGLYCERIN (NITROSTAT) 0.4 MG SL tablet Place 1 tablet (0.4 mg total) under the tongue every 5 (five) minutes x 3 doses as needed. 07/09/18   Lorretta Harp, MD  Nutritional Supplements (IMMUNOCAL PO) Take 1 packet by mouth daily. Mix in juice and drink    [provider]  thiamine (VITAMIN B-1) 100 MG tablet Take 100 mg by mouth daily.    [provider]  Vitamin D, Ergocalciferol, (DRISDOL) 1.25 MG (50000 UNIT) CAPS capsule Take 50,000 Units by mouth every 7 (seven) days.    [provider]    Family History    Family History  Problem Relation Age of Onset   Heart attack Brother    She indicated that her brother is alive.  Social History    Social History   Socioeconomic History   Marital status:  Divorced    Spouse name: Not on file   Number of children: Not on file   Years of education: Not on file   Highest education level: Not on file  Occupational History   Not on file  Tobacco Use   Smoking status: Former    Types: Cigarettes    Quit date: 09/10/1982    Years since quitting: 39.7   Smokeless tobacco: Never  Substance and Sexual Activity   Alcohol use: Yes    Comment: occasional   Drug use: No   Sexual activity: Never    Birth control/protection: Post-menopausal  Other Topics Concern   Not on file  Social History Narrative   Not on file   Social Determinants of Health   Financial Resource Strain: Not on file  Food Insecurity: Not on file  Transportation Needs: Not on file  Physical Activity:  Not on file  Stress: Not on file  Social Connections: Not on file  Intimate Partner Violence: Not on file     Review of Systems    General:  No chills, fever, night sweats or weight changes.  Cardiovascular:  No chest pain, dyspnea on exertion, edema, orthopnea, palpitations, paroxysmal nocturnal dyspnea. Dermatological: No rash, lesions/masses Respiratory: No cough, dyspnea Urologic: No hematuria, dysuria Abdominal:   No nausea, vomiting, diarrhea, bright red blood per rectum, melena, or hematemesis Neurologic:  No visual changes, wkns, changes in mental status. All other systems reviewed and are otherwise negative except as noted above.  Physical Exam    VS:  BP 128/76   Pulse 78   Ht '5\' 6"'$  (1.676 m)   Wt 217 lb 3.2 oz (98.5 kg)   BMI 35.06 kg/m  , BMI Body mass index is 35.06 kg/m. GEN: Well nourished, well developed, in no acute distress. HEENT: normal. Neck: Supple, no JVD, carotid bruits, or masses. Cardiac: RRR, no murmurs, rubs, or gallops. No clubbing, cyanosis, edema.  Radials/DP/PT 2+ and equal bilaterally.  Respiratory:  Respirations regular and unlabored, clear to auscultation bilaterally. GI: Soft, nontender, nondistended, BS + x 4. MS: no  deformity or atrophy. Skin: warm and dry, no rash. Neuro:  Strength and sensation are intact. Psych: Normal affect.  Accessory Clinical Findings    Recent Labs: No results found for requested labs within last 365 days.   Recent Lipid Panel    Component Value Date/Time   CHOL 198 07/13/2019 0745   TRIG 29 07/13/2019 0745   HDL 55 07/13/2019 0745   CHOLHDL 3.6 07/13/2019 0745   VLDL 6 07/13/2019 0745   LDLCALC 137 (H) 07/13/2019 0745    ECG personally reviewed by me today-normal sinus rhythm with sinus arrhythmia moderate voltage criteria for LVH 78 bpm  EKG 02/13/22 normal sinus rhythm left axis deviation moderate voltage criteria for LVH 72 bpm- No acute changes  Cardiac catheterization 06/09/2018  History obtained from chart review.81 y.o. female with small NSTEMI (inferolateral hypokinesis, preserved LVEF, peak troponin 1.0).  She presents now for diagnostic coronary angiography via the right radial approach.     IMPRESSION: Ms. Wandell has essentially normal coronary arteries and a codominant system with an LVEDP of 18.  Medical therapy will be recommended for "noncardiac chest pain" although coronary vasospasm could also be a possibility.  The patient does admit to a lot of recent stress.  The sheath was removed and a TR band was placed on the right wrist to achieve patent hemostasis.  The patient left the lab in stable condition.  She can probably be discharged home later this morning.   Quay Burow. MD, [facility] 06/09/2018 8:26 AM  Diagnostic Dominance: Co-dominant      Echo 02/12/20  IMPRESSIONS     1. Left ventricular ejection fraction, by estimation, is 55 to 60%. The  left ventricle has normal function. The left ventricle has no regional  wall motion abnormalities. Left ventricular diastolic parameters are  consistent with Grade I diastolic  dysfunction (impaired relaxation). Elevated left atrial pressure.   2. Right ventricular systolic function is normal. The  right ventricular  size is normal. There is normal pulmonary artery systolic pressure. The  estimated right ventricular systolic pressure is 64.4 mmHg.   3. Left atrial size was mildly dilated.   4. The mitral valve is normal in structure. Mild mitral valve  regurgitation. No evidence of mitral stenosis.   5. The aortic valve is normal  in structure. Aortic valve regurgitation is  not visualized. Mild aortic valve stenosis.   6. The inferior vena cava is normal in size with greater than 50%  respiratory variability, suggesting right atrial pressure of 3 mmHg.  Assessment & Plan   1.  Chest discomfort,Nstemi-underwent cardiac catheterization 9/19 and was not noted to have coronary disease. Continue current medical therapy Heart healthy low-sodium diet-salty 6 given Increase physical activity as tolerated  DOE, chest pressure-no recent episodes of chest discomfort.  History of pulmonary embolus.  Denies missed episodes of apixaban.  Denies bleeding issues.  Chest pressure was in the setting of stressful news about her son.  Resolved after she drank cool water. Continue apixaban Increase physical activity as tolerated Mental stress reduction sheet given  Hyperlipidemia-LDL 149 on 06/23/21.  Previously wished to defer statin therapy. Continue Co Q 10 Heart healthy low-sodium high-fiber diet Increase physical activity as tolerated  Essential hypertension-BP today 128/76.  Well-controlled at home. Continue hydrochlorothiazide Heart healthy low-sodium diet-salty 6 given Increase physical activity as tolerated  Aortic stenosis- No increased activity intolerance or DOE. Noted to have mild aortic stenosis on echocardiogram 6/21.   Repeat Echo   Disposition: Follow-up with Dr. Gwenlyn Found or me in 6-9 months.  Jossie Ng. Joshlynn Alfonzo NP-C    06/12/2022, 2:09 PM [address] Office [phone] Fax [phone]  Notice: This dictation was  prepared with Dragon dictation along with smaller phrase technology. Any transcriptional errors that result from this process are unintentional and may not be corrected upon review.  I spent 14 minutes examining this patient, reviewing medications, and using patient centered shared decision making involving her cardiac care.  Prior to her visit I spent greater than 20 minutes reviewing her past medical history,  medications, and prior cardiac tests.

## 2022-06-12 ENCOUNTER — Encounter: Payer: Self-pay | Admitting: General Practice

## 2022-06-12 ENCOUNTER — Ambulatory Visit: Payer: Medicare HMO | Attending: General Practice | Admitting: General Practice

## 2022-06-12 VITALS — BP 128/76 | HR 78 | Ht 66.0 in | Wt 217.2 lb

## 2022-06-12 DIAGNOSIS — I252 Old myocardial infarction: Secondary | ICD-10-CM

## 2022-06-12 DIAGNOSIS — E782 Mixed hyperlipidemia: Secondary | ICD-10-CM | POA: Diagnosis not present

## 2022-06-12 DIAGNOSIS — R0609 Other forms of dyspnea: Secondary | ICD-10-CM

## 2022-06-12 DIAGNOSIS — I350 Nonrheumatic aortic (valve) stenosis: Secondary | ICD-10-CM

## 2022-06-12 DIAGNOSIS — I10 Essential (primary) hypertension: Secondary | ICD-10-CM | POA: Diagnosis not present

## 2022-06-12 NOTE — Patient Instructions (Addendum)
Medication Instructions:  The current medical regimen is effective;  continue present plan and medications as directed. Please refer to the Current Medication list given to you today.  *If you need a refill on your cardiac medications before your next appointment, please call your pharmacy*  Lab Work: NONE-AT PCP. PLEASE HAVE PRIMARY FORWARD HER RESULTS TO [phone]  Follow-Up: At [facility], you and your health needs are our priority.  As part of our continuing mission to provide you with exceptional heart care, we have created designated Provider Care Teams.  These Care Teams include your primary Cardiologist (physician) and Advanced Practice Providers (APPs -  Physician Assistants and Nurse Practitioners) who all work together to provide you with the care you need, when you need it.  We recommend signing up for the patient portal called "MyChart".  Sign up information is provided on this After Visit Summary.  MyChart is used to connect with patients for Virtual Visits (Telemedicine).  Patients are able to view lab/test results, encounter notes, upcoming appointments, etc.  Non-urgent messages can be sent to your provider as well.   To learn more about what you can do with MyChart, go to [email or web address].    Your next appointment:   6-9 month(s)  The format for your next appointment:   In Person  Provider:   Quay Burow, MD  or Coletta Memos, FNP       Other Instructions PLEASE READ AND FOLLOW BELOW: Iron-Rich Diet  Iron is a mineral that helps your body produce hemoglobin. Hemoglobin is a protein in red blood cells that carries oxygen to your body's tissues. Eating too little iron may cause you to feel weak and tired, and it can increase your risk of infection. Iron is naturally found in many foods, and many foods have iron added to them (are iron-fortified). You may need to follow an iron-rich diet if you do not have enough iron in your body due to certain  medical conditions. The amount of iron that you need each day depends on your age, your sex, and any medical conditions you have. Follow instructions from your health care provider or a dietitian about how much iron you should eat each day. What are tips for following this plan? Reading food labels Check food labels to see how many milligrams (mg) of iron are in each serving. Cooking Cook foods in pots and pans that are made from iron. Take these steps to make it easier for your body to absorb iron from certain foods: Soak beans overnight before cooking. Soak whole grains overnight and drain them before using. Ferment flours before baking, such as by using yeast in bread dough. Meal planning When you eat foods that contain iron, you should eat them with foods that are high in vitamin C. These include oranges, peppers, tomatoes, potatoes, and mangoes. Vitamin C helps your body absorb iron. Certain foods and drinks prevent your body from absorbing iron properly. Avoid eating these foods in the same meal as iron-rich foods or with iron supplements. These foods include: Coffee, black tea, and red wine. Milk, dairy products, and foods that are high in calcium. Beans and soybeans. Whole grains. General information Take iron supplements only as told by your health care provider. An overdose of iron can be life-threatening. If you were prescribed iron supplements, take them with orange juice or a vitamin C supplement. When you eat iron-fortified foods or take an iron supplement, you should also eat foods that naturally contain iron, such  as meat, poultry, and fish. Eating naturally iron-rich foods helps your body absorb the iron that is added to other foods or contained in a supplement. Iron from animal sources is better absorbed than iron from plant sources. What foods should I eat? Fruits Prunes. Raisins. Eat fruits high in vitamin C, such as oranges, grapefruits, and strawberries, with iron-rich  foods. Vegetables Spinach (cooked). Green peas. Broccoli. Fermented vegetables. Eat vegetables high in vitamin C, such as leafy greens, potatoes, bell peppers, and tomatoes, with iron-rich foods. Grains Iron-fortified breakfast cereal. Iron-fortified whole-wheat bread. Enriched rice. Sprouted grains. Meats and other proteins Beef liver. Beef. [place]. Chicken. Oysters. Shrimp. [place]. Sardines. Chickpeas. Nuts. Tofu. Pumpkin seeds. Beverages Tomato juice. Fresh orange juice. Prune juice. Hibiscus tea. Iron-fortified instant breakfast shakes. Sweets and desserts Blackstrap molasses. Seasonings and condiments Tahini. Fermented soy sauce. Other foods Wheat germ. The items listed above may not be a complete list of recommended foods and beverages. Contact a dietitian for more information. What foods should I limit? These are foods that should be limited while eating iron-rich foods as they can reduce the absorption of iron in your body. Grains Whole grains. Bran cereal. Bran flour. Meats and other proteins Soybeans. Products made from soy protein. Black beans. Lentils. Mung beans. Split peas. Dairy Milk. Cream. Cheese. Yogurt. Cottage cheese. Beverages Coffee. Black tea. Red wine. Sweets and desserts Cocoa. Chocolate. Ice cream. Seasonings and condiments Basil. Oregano. Large amounts of parsley. The items listed above may not be a complete list of foods and beverages you should limit. Contact a dietitian for more information. Summary Iron is a mineral that helps your body produce hemoglobin. Hemoglobin is a protein in red blood cells that carries oxygen to your body's tissues. Iron is naturally found in many foods, and many foods have iron added to them (are iron-fortified). When you eat foods that contain iron, you should eat them with foods that are high in vitamin C. Vitamin C helps your body absorb iron. Certain foods and drinks prevent your body from absorbing iron properly, such as  whole grains and dairy products. You should avoid eating these foods in the same meal as iron-rich foods or with iron supplements. This information is not intended to replace advice given to you by your health care provider. Make sure you discuss any questions you have with your health care provider. Document Revised: 08/08/2020 Document Reviewed: 08/08/2020 Elsevier Patient Education  [place]

## 2022-06-20 ENCOUNTER — Ambulatory Visit: Payer: Medicare HMO | Admitting: General Practice

## 2022-06-21 DIAGNOSIS — I10 Essential (primary) hypertension: Secondary | ICD-10-CM | POA: Diagnosis not present

## 2022-06-21 DIAGNOSIS — Z86711 Personal history of pulmonary embolism: Secondary | ICD-10-CM | POA: Diagnosis not present

## 2022-06-21 DIAGNOSIS — E785 Hyperlipidemia, unspecified: Secondary | ICD-10-CM | POA: Diagnosis not present

## 2022-07-12 DIAGNOSIS — Z86711 Personal history of pulmonary embolism: Secondary | ICD-10-CM | POA: Diagnosis not present

## 2022-07-12 DIAGNOSIS — E2839 Other primary ovarian failure: Secondary | ICD-10-CM | POA: Diagnosis not present

## 2022-07-12 DIAGNOSIS — E785 Hyperlipidemia, unspecified: Secondary | ICD-10-CM | POA: Diagnosis not present

## 2022-07-12 DIAGNOSIS — E6609 Other obesity due to excess calories: Secondary | ICD-10-CM | POA: Diagnosis not present

## 2022-07-12 DIAGNOSIS — I2510 Atherosclerotic heart disease of native coronary artery without angina pectoris: Secondary | ICD-10-CM | POA: Diagnosis not present

## 2022-07-12 DIAGNOSIS — Z23 Encounter for immunization: Secondary | ICD-10-CM | POA: Diagnosis not present

## 2022-07-12 DIAGNOSIS — I10 Essential (primary) hypertension: Secondary | ICD-10-CM | POA: Diagnosis not present

## 2022-07-12 DIAGNOSIS — Z1159 Encounter for screening for other viral diseases: Secondary | ICD-10-CM | POA: Diagnosis not present

## 2022-07-12 DIAGNOSIS — Z0000 Encounter for general adult medical examination without abnormal findings: Secondary | ICD-10-CM | POA: Diagnosis not present

## 2022-09-11 DIAGNOSIS — M79671 Pain in right foot: Secondary | ICD-10-CM | POA: Diagnosis not present

## 2022-09-11 DIAGNOSIS — M25571 Pain in right ankle and joints of right foot: Secondary | ICD-10-CM | POA: Diagnosis not present

## 2022-09-11 DIAGNOSIS — M7751 Other enthesopathy of right foot: Secondary | ICD-10-CM | POA: Diagnosis not present

## 2022-09-11 DIAGNOSIS — M2021 Hallux rigidus, right foot: Secondary | ICD-10-CM | POA: Diagnosis not present

## 2022-09-19 DIAGNOSIS — M25571 Pain in right ankle and joints of right foot: Secondary | ICD-10-CM | POA: Diagnosis not present

## 2022-10-02 DIAGNOSIS — H1011 Acute atopic conjunctivitis, right eye: Secondary | ICD-10-CM | POA: Diagnosis not present

## 2022-10-02 DIAGNOSIS — H04123 Dry eye syndrome of bilateral lacrimal glands: Secondary | ICD-10-CM | POA: Diagnosis not present

## 2022-10-02 DIAGNOSIS — H11821 Conjunctivochalasis, right eye: Secondary | ICD-10-CM | POA: Diagnosis not present

## 2022-10-02 DIAGNOSIS — H25812 Combined forms of age-related cataract, left eye: Secondary | ICD-10-CM | POA: Diagnosis not present

## 2022-10-02 DIAGNOSIS — H43821 Vitreomacular adhesion, right eye: Secondary | ICD-10-CM | POA: Diagnosis not present

## 2022-10-08 DIAGNOSIS — H8110 Benign paroxysmal vertigo, unspecified ear: Secondary | ICD-10-CM | POA: Diagnosis not present

## 2022-10-08 DIAGNOSIS — R413 Other amnesia: Secondary | ICD-10-CM | POA: Diagnosis not present

## 2022-10-08 DIAGNOSIS — I10 Essential (primary) hypertension: Secondary | ICD-10-CM | POA: Diagnosis not present

## 2022-10-08 DIAGNOSIS — R42 Dizziness and giddiness: Secondary | ICD-10-CM | POA: Diagnosis not present

## 2022-10-08 NOTE — Progress Notes (Unsigned)
Cardiology Clinic Note   Patient Name: Karen Owens Date of Encounter: 10/10/2022  Primary Care Provider:  Harlan Stains, MD Primary Cardiologist:  Quay Burow, MD  Patient Profile    Karen Owens is a 82 year old female presents to the clinic today for follow-up evaluation of her shortness of breath.  Past Medical History    Past Medical History:  Diagnosis Date   Arthritis    Asthma    Complication of anesthesia    difficulty waking up   DVT (deep venous thrombosis) ([place])    Refusal of blood transfusions as patient is Jehovah's Witness    Thyroid nodule    Past Surgical History:  Procedure Laterality Date   DILATION AND CURETTAGE OF UTERUS     LEFT HEART CATH AND CORONARY ANGIOGRAPHY N/A 06/09/2018   Procedure: LEFT HEART CATH AND CORONARY ANGIOGRAPHY;  Surgeon: Lorretta Harp, MD;  Location: [facility];  Service: Cardiovascular;  Laterality: N/A;   LIPOMA EXCISION     right arm   TOTAL KNEE ARTHROPLASTY     left   TOTAL KNEE ARTHROPLASTY  08/20/2012   right knee   TOTAL KNEE ARTHROPLASTY  08/20/2012   Procedure: TOTAL KNEE ARTHROPLASTY;  Surgeon: Ninetta Lights, MD;  Location: [facility];  Service: Orthopedics;  Laterality: Right;    Allergies  Allergies  Allergen Reactions   Tuberculin Tests Hives   Other Other (See Comments)    Jehovah's Witness- NO BLOOD PRODUCTS!!    History of Present Illness    Karen Owens has a PMH of DVT, pulmonary embolus, Prinzmetal angina, essential hypertension, asthma, anxiety, hyperlipidemia, NSTEMI, PE, and is a Jehovah's Witness.  She underwent cardiac catheterization which showed no coronary artery disease 06/09/2018.  She was seen by Laurann Montana, NP-C 04/14/2021.  She noted no shortness of breath at rest.  She indicated that her dyspnea on exertion was improving.  She felt that she was overall better.  She denied chest pain, pressure, and tightness.  She had no lower extremity swelling orthopnea or PND.  She  denied palpitations.  She was using a pedal exerciser.  She has 3 sons and 3 daughters and was happy to be a great grandmother.  (Recent birth of first great grandchild)  She contacted the nurse triage line on 02/08/2022.  She reported occasional episodes of chest tightness and shortness of breath.  The episodes started about 1 week prior.  She noted that she has been walking regularly and reported the episodes had dissipated over the last few days however she would still like to be seen.  She presented to the clinic 02/13/22 for evaluation and stated she  had 2 episodes of chest pressure.  The first episode happened after a call that included stressful news about her handicapped son.  Another episode happened after a climbing up a set of stairs.  Both episodes resolved spontaneously after drinking cool water.  We reviewed her cardiac catheterization and previous echocardiogram.  She asked about antianxiety medication (Xanax).  I  gave her the mindfulness stress reduction sheet, salty 6 diet sheet, continued her current medication regimen, and planned follow-up in 4 to 6 months.  I reassured her that her chest pressure did not appear to be related to cardiac issues.  She presented to the clinic 06/12/22 for follow up evaluation and stated she continued to do well from a heart standpoint today.  She continued to notice some increased work of breathing with increased physical activity  such as climbing stairs.  She had normal breathing with normal activities.  She continued to follow a reduced sodium diet.  Her blood pressure was well controlled today.  We reviewed her previous cardiac catheterization and echocardiogram again.  She expressed understanding.  She did notice some funny feeling in her chest 2 weeks ago.  It lasted for few seconds and resolved on its own.  She reported that she was able to drink some cool water and the symptoms went away.  We reviewed possible causes for the discomfort and she was  reassured that it was not related to her heart.  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  We planned follow-up in 6 to 9 months.  She presents to the clinic today for follow-up evaluation and states she feels well today.  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  Reports that when the weather is cooler she prefers to walk in department stores.  She does not like walking in her neighborhood due to the dogs.  She occasionally notices brief fluttering sensation that lasts for seconds and dissipates on its own or with rest.  We reviewed her cardiac catheterization, recent lab work, and echocardiogram.  She expressed understanding.  We will defer repeat echocardiogram at this time and plan for repeat echo to evaluate aortic valve January 2025.  I have asked her to increase her physical activity as tolerated and continue her current medication regimen.  Will plan follow-up in 12 months.  Today she denies chest pain, shortness of breath, lower extremity edema, fatigue, palpitations, melena, hematuria, hemoptysis, diaphoresis, weakness, presyncope, syncope, orthopnea, and PND.   Home Medications    Prior to Admission medications   Medication Sig Start Date End Date Taking? Authorizing Provider  apixaban (ELIQUIS) 5 MG TABS tablet Take 1 tablet (5 mg total) by mouth 2 (two) times daily. 07/22/19   Shelly Coss, MD  APPLE CIDER VINEGAR PO Take 250 mg by mouth daily.    [provider]  carboxymethylcellulose (REFRESH TEARS) 0.5 % SOLN Place 1 drop into both eyes 2 (two) times daily.    [provider]  Coenzyme Q10 (CO Q 10 PO) Take 1 tablet by mouth daily.    [provider]  hydrochlorothiazide (MICROZIDE) 12.5 MG capsule Take 12.5 mg by mouth every other day.    [provider]  ibuprofen (ADVIL,MOTRIN) 800 MG tablet Take 800 mg by mouth every 8 (eight) hours as needed  (inflammation/pain).  05/05/18   [provider]  nitroGLYCERIN (NITROSTAT) 0.4 MG SL tablet Place 1 tablet (0.4 mg total) under the tongue every 5 (five) minutes x 3 doses as needed. 07/09/18   Lorretta Harp, MD  Nutritional Supplements (IMMUNOCAL PO) Take 1 packet by mouth daily. Mix in juice and drink    [provider]  thiamine (VITAMIN B-1) 100 MG tablet Take 100 mg by mouth daily.    [provider]  Vitamin D, Ergocalciferol, (DRISDOL) 1.25 MG (50000 UNIT) CAPS capsule Take 50,000 Units by mouth every 7 (seven) days.    [provider]    Family History    Family History  Problem Relation Age of Onset   Heart attack Brother    She indicated that her brother is alive.  Social History    Social History   Socioeconomic History   Marital status: Divorced    Spouse name: Not on file   Number of children: Not on file  Years of education: Not on file   Highest education level: Not on file  Occupational History   Not on file  Tobacco Use   Smoking status: Former    Types: Cigarettes    Quit date: 09/10/1982    Years since quitting: 40.1   Smokeless tobacco: Never  Substance and Sexual Activity   Alcohol use: Yes    Comment: occasional   Drug use: No   Sexual activity: Never    Birth control/protection: Post-menopausal  Other Topics Concern   Not on file  Social History Narrative   Not on file   Social Determinants of Health   Financial Resource Strain: Not on file  Food Insecurity: Not on file  Transportation Needs: Not on file  Physical Activity: Not on file  Stress: Not on file  Social Connections: Not on file  Intimate Partner Violence: Not on file     Review of Systems    General:  No chills, fever, night sweats or weight changes.  Cardiovascular:  No chest pain, dyspnea on exertion, edema, orthopnea, palpitations, paroxysmal nocturnal dyspnea. Dermatological: No rash, lesions/masses Respiratory: No cough,  dyspnea Urologic: No hematuria, dysuria Abdominal:   No nausea, vomiting, diarrhea, bright red blood per rectum, melena, or hematemesis Neurologic:  No visual changes, wkns, changes in mental status. All other systems reviewed and are otherwise negative except as noted above.  Physical Exam    VS:  BP 120/68   Pulse 88   Ht '5\' 6"'$  (1.676 m)   Wt 208 lb 9.6 oz (94.6 kg)   SpO2 100%   BMI 33.67 kg/m  , BMI Body mass index is 33.67 kg/m. GEN: Well nourished, well developed, in no acute distress. HEENT: normal. Neck: Supple, no JVD, carotid bruits, or masses. Cardiac: RRR, no murmurs, rubs, or gallops. No clubbing, cyanosis, edema.  Radials/DP/PT 2+ and equal bilaterally.  Respiratory:  Respirations regular and unlabored, clear to auscultation bilaterally. GI: Soft, nontender, nondistended, BS + x 4. MS: no deformity or atrophy. Skin: warm and dry, no rash. Neuro:  Strength and sensation are intact. Psych: Normal affect.  Accessory Clinical Findings    Recent Labs: No results found for requested labs within last 365 days.   Recent Lipid Panel    Component Value Date/Time   CHOL 198 07/13/2019 0745   TRIG 29 07/13/2019 0745   HDL 55 07/13/2019 0745   CHOLHDL 3.6 07/13/2019 0745   VLDL 6 07/13/2019 0745   LDLCALC 137 (H) 07/13/2019 0745    ECG personally reviewed by me today-none today.   EKG 06/12/2022 normal sinus rhythm with sinus arrhythmia moderate voltage criteria for LVH 78 bpm  EKG 02/13/22 normal sinus rhythm left axis deviation moderate voltage criteria for LVH 72 bpm- No acute changes  Cardiac catheterization 06/09/2018  History obtained from chart review.82 y.o. female with small NSTEMI (inferolateral hypokinesis, preserved LVEF, peak troponin 1.0).  She presents now for diagnostic coronary angiography via the right radial approach.     IMPRESSION: Ms. Dolecki has essentially normal coronary arteries and a codominant system with an LVEDP of 18.  Medical therapy  will be recommended for "noncardiac chest pain" although coronary vasospasm could also be a possibility.  The patient does admit to a lot of recent stress.  The sheath was removed and a TR band was placed on the right wrist to achieve patent hemostasis.  The patient left the lab in stable condition.  She can probably be discharged home later this morning.  Quay Burow. MD, [facility] 06/09/2018 8:26 AM  Diagnostic Dominance: Co-dominant      Echo 02/12/20  IMPRESSIONS     1. Left ventricular ejection fraction, by estimation, is 55 to 60%. The  left ventricle has normal function. The left ventricle has no regional  wall motion abnormalities. Left ventricular diastolic parameters are  consistent with Grade I diastolic  dysfunction (impaired relaxation). Elevated left atrial pressure.   2. Right ventricular systolic function is normal. The right ventricular  size is normal. There is normal pulmonary artery systolic pressure. The  estimated right ventricular systolic pressure is 48.2 mmHg.   3. Left atrial size was mildly dilated.   4. The mitral valve is normal in structure. Mild mitral valve  regurgitation. No evidence of mitral stenosis.   5. The aortic valve is normal in structure. Aortic valve regurgitation is  not visualized. Mild aortic valve stenosis.   6. The inferior vena cava is normal in size with greater than 50%  respiratory variability, suggesting right atrial pressure of 3 mmHg.  Assessment & Plan   1.  DOE, chest pressure-denies chest pressure and recent episodes of chest pain.  History of pulmonary embolus.  Compliant with apixaban.  Denies bleeding issues.  Prior chest pressure was in the setting of stressful news about her son.  Resolved after drinking cool water. Continue apixaban Increase physical activity as tolerated Mental stress reduction sheet given  Chest discomfort,Nstemi-no chest pain today.  Activity stable.  Underwent cardiac catheterization 9/19 and  was not noted to have coronary disease. Continue current medical therapy Heart healthy low-sodium diet-salty 6 given Increase physical activity as tolerated   Essential hypertension-BP today 120/68.  Well-controlled at home. Continue hydrochlorothiazide Heart healthy low-sodium diet-salty 6 given Increase physical activity as tolerated  Aortic stenosis- No increased activity intolerance or DOE. Noted to have mild aortic stenosis on echocardiogram 6/21.   Recommended repeat Echo   Hyperlipidemia-LDL 154 on 06/23/21.  Previously wished to defer statin therapy. Continue Co Q 10 Heart healthy low-sodium high-fiber diet Increase physical activity as tolerated  Disposition: Follow-up with Dr. Gwenlyn Found or me in 9-12 months.  Jossie Ng. Deaire Mcwhirter NP-C    10/10/2022, 2:58 PM [facility] [address] [address] Office [phone] Fax [phone]  Notice: This dictation was prepared with Dragon dictation along with smaller phrase technology. Any transcriptional errors that result from this process are unintentional and may not be corrected upon review.  I spent 14 minutes examining this patient, reviewing medications, and using patient centered shared decision making involving her cardiac care.  Prior to her visit I spent greater than 20 minutes reviewing her past medical history,  medications, and prior cardiac tests.

## 2022-10-10 ENCOUNTER — Encounter: Payer: Self-pay | Admitting: General Practice

## 2022-10-10 ENCOUNTER — Ambulatory Visit: Payer: Medicare HMO | Attending: General Practice | Admitting: General Practice

## 2022-10-10 VITALS — BP 120/68 | HR 88 | Ht 66.0 in | Wt 208.6 lb

## 2022-10-10 DIAGNOSIS — I350 Nonrheumatic aortic (valve) stenosis: Secondary | ICD-10-CM | POA: Diagnosis not present

## 2022-10-10 DIAGNOSIS — I10 Essential (primary) hypertension: Secondary | ICD-10-CM

## 2022-10-10 DIAGNOSIS — E782 Mixed hyperlipidemia: Secondary | ICD-10-CM | POA: Diagnosis not present

## 2022-10-10 DIAGNOSIS — R0609 Other forms of dyspnea: Secondary | ICD-10-CM | POA: Diagnosis not present

## 2022-10-10 DIAGNOSIS — I252 Old myocardial infarction: Secondary | ICD-10-CM

## 2022-10-10 NOTE — Patient Instructions (Signed)
Medication Instructions:  The current medical regimen is effective;  continue present plan and medications as directed. Please refer to the Current Medication list given to you today.  *If you need a refill on your cardiac medications before your next appointment, please call your pharmacy* Lab Work: NONE If you have labs (blood work) drawn today and your tests are completely normal, you will receive your results only by:   [place] (if you have MyChart) OR A paper copy in the mail If you have any lab test that is abnormal or we need to change your treatment, we will call you to review the results.  Testing/Procedures: Echocardiogram (IN JAN 2025) - Your physician has requested that you have an echocardiogram. Echocardiography is a painless test that uses sound waves to create images of your heart. It provides your doctor with information about the size and shape of your heart and how well your heart's chambers and valves are working. This procedure takes approximately one hour. There are no restrictions for this procedure.     Follow-Up: At [facility], you and your health needs are our priority.  As part of our continuing mission to provide you with exceptional heart care, we have created designated Provider Care Teams.  These Care Teams include your primary Cardiologist (physician) and Advanced Practice Providers (APPs -  Physician Assistants and Nurse Practitioners) who all work together to provide you with the care you need, when you need it.  We recommend signing up for the patient portal called "MyChart".  Sign up information is provided on this After Visit Summary.  MyChart is used to connect with patients for Virtual Visits (Telemedicine).  Patients are able to view lab/test results, encounter notes, upcoming appointments, etc.  Non-urgent messages can be sent to your provider as well.   To learn more about what you can do with MyChart, go to [email or web address].     Your next appointment:   12 month(s)  Provider:   Coletta Memos, FNP    Other Instructions PLEASE READ AND FOLLOW INCREASED FIBER DIET  MAINTAIN PHYSICAL ACTIVITY     High-Fiber Eating Plan Fiber, also called dietary fiber, is a type of carbohydrate. It is found foods such as fruits, vegetables, whole grains, and beans. A high-fiber diet can have many health benefits. Your health care provider may recommend a high-fiber diet to help: Prevent constipation. Fiber can make your bowel movements more regular. Lower your cholesterol. Relieve the following conditions: Inflammation of veins in the anus (hemorrhoids). Inflammation of specific areas of the digestive tract (uncomplicated diverticulosis). A problem of the large intestine, also called the colon, that sometimes causes pain and diarrhea (irritable bowel syndrome, or IBS). Prevent overeating as part of a weight-loss plan. Prevent heart disease, type 2 diabetes, and certain cancers. What are tips for following this plan? Reading food labels  Check the nutrition facts label on food products for the amount of dietary fiber. Choose foods that have 5 grams of fiber or more per serving. The goals for recommended daily fiber intake include: Men (age 40 or younger): 34-38 g. Men (over age 70): 28-34 g. Women (age 61 or younger): 25-28 g. Women (over age 22): 22-25 g. Your daily fiber goal is _____________ g. Shopping Choose whole fruits and vegetables instead of processed forms, such as apple juice or applesauce. Choose a wide variety of high-fiber foods such as avocados, lentils, oats, and kidney beans. Read the nutrition facts label of the foods you choose. Be aware  of foods with added fiber. These foods often have high sugar and sodium amounts per serving. Cooking Use whole-grain flour for baking and cooking. Cook with brown rice instead of white rice. Meal planning Start the day with a breakfast that is high in fiber, such  as a cereal that contains 5 g of fiber or more per serving. Eat breads and cereals that are made with whole-grain flour instead of refined flour or white flour. Eat brown rice, bulgur wheat, or millet instead of white rice. Use beans in place of meat in soups, salads, and pasta dishes. Be sure that half of the grains you eat each day are whole grains. General information You can get the recommended daily intake of dietary fiber by: Eating a variety of fruits, vegetables, grains, nuts, and beans. Taking a fiber supplement if you are not able to take in enough fiber in your diet. It is better to get fiber through food than from a supplement. Gradually increase how much fiber you consume. If you increase your intake of dietary fiber too quickly, you may have bloating, cramping, or gas. Drink plenty of water to help you digest fiber. Choose high-fiber snacks, such as berries, raw vegetables, nuts, and popcorn. What foods should I eat? Fruits Berries. Pears. Apples. Oranges. Avocado. Prunes and raisins. Dried figs. Vegetables Sweet potatoes. Spinach. Kale. Artichokes. Cabbage. Broccoli. Cauliflower. Green peas. Carrots. Squash. Grains Whole-grain breads. Multigrain cereal. Oats and oatmeal. Brown rice. Barley. Bulgur wheat. [place]. Quinoa. Bran muffins. Popcorn. Rye wafer crackers. Meats and other proteins Navy beans, kidney beans, and pinto beans. Soybeans. Split peas. Lentils. Nuts and seeds. Dairy Fiber-fortified yogurt. Beverages Fiber-fortified soy milk. Fiber-fortified orange juice. Other foods Fiber bars. The items listed above may not be a complete list of recommended foods and beverages. Contact a dietitian for more information. What foods should I avoid? Fruits Fruit juice. Cooked, strained fruit. Vegetables Fried potatoes. Canned vegetables. Well-cooked vegetables. Grains White bread. Pasta made with refined flour. White rice. Meats and other proteins Fatty cuts of meat.  Fried chicken or fried fish. Dairy Milk. Yogurt. Cream cheese. Sour cream. Fats and oils Butters. Beverages Soft drinks. Other foods Cakes and pastries. The items listed above may not be a complete list of foods and beverages to avoid. Talk with your dietitian about what choices are best for you. Summary Fiber is a type of carbohydrate. It is found in foods such as fruits, vegetables, whole grains, and beans. A high-fiber diet has many benefits. It can help to prevent constipation, lower blood cholesterol, aid weight loss, and reduce your risk of heart disease, diabetes, and certain cancers. Increase your intake of fiber gradually. Increasing fiber too quickly may cause cramping, bloating, and gas. Drink plenty of water while you increase the amount of fiber you consume. The best sources of fiber include whole fruits and vegetables, whole grains, nuts, seeds, and beans. This information is not intended to replace advice given to you by your health care provider. Make sure you discuss any questions you have with your health care provider. Document Revised: 12/31/2019 Document Reviewed: 12/31/2019 Elsevier Patient Education  [place].

## 2022-10-19 DIAGNOSIS — H25812 Combined forms of age-related cataract, left eye: Secondary | ICD-10-CM | POA: Diagnosis not present

## 2022-11-28 DIAGNOSIS — H2511 Age-related nuclear cataract, right eye: Secondary | ICD-10-CM | POA: Diagnosis not present

## 2022-11-29 ENCOUNTER — Ambulatory Visit: Payer: Medicare HMO | Admitting: General Practice

## 2022-11-30 DIAGNOSIS — H25811 Combined forms of age-related cataract, right eye: Secondary | ICD-10-CM | POA: Diagnosis not present

## 2022-12-19 DIAGNOSIS — M7751 Other enthesopathy of right foot: Secondary | ICD-10-CM | POA: Diagnosis not present

## 2022-12-19 DIAGNOSIS — M25571 Pain in right ankle and joints of right foot: Secondary | ICD-10-CM | POA: Diagnosis not present

## 2022-12-28 DIAGNOSIS — S8002XA Contusion of left knee, initial encounter: Secondary | ICD-10-CM | POA: Diagnosis not present

## 2022-12-28 DIAGNOSIS — M19012 Primary osteoarthritis, left shoulder: Secondary | ICD-10-CM | POA: Diagnosis not present

## 2022-12-28 DIAGNOSIS — M19011 Primary osteoarthritis, right shoulder: Secondary | ICD-10-CM | POA: Diagnosis not present

## 2023-01-14 DIAGNOSIS — M25572 Pain in left ankle and joints of left foot: Secondary | ICD-10-CM | POA: Diagnosis not present

## 2023-01-14 DIAGNOSIS — M25571 Pain in right ankle and joints of right foot: Secondary | ICD-10-CM | POA: Diagnosis not present

## 2023-01-29 NOTE — Progress Notes (Unsigned)
Cardiology Clinic Note   Patient Name: Karen Owens Date of Encounter: 01/31/2023  Primary Care Provider:  Laurann Montana, MD Primary Cardiologist:  Nanetta Batty, MD  Patient Profile    Karen Owens is a 82 year old female presents to the clinic today for follow-up evaluation of her shortness of breath.  Past Medical History    Past Medical History:  Diagnosis Date   Arthritis    Asthma    Complication of anesthesia    difficulty waking up   DVT (deep venous thrombosis) (HCC)    Refusal of blood transfusions as patient is Jehovah's Witness    Thyroid nodule    Past Surgical History:  Procedure Laterality Date   DILATION AND CURETTAGE OF UTERUS     LEFT HEART CATH AND CORONARY ANGIOGRAPHY N/A 06/09/2018   Procedure: LEFT HEART CATH AND CORONARY ANGIOGRAPHY;  Surgeon: Runell Gess, MD;  Location: [facility];  Service: Cardiovascular;  Laterality: N/A;   LIPOMA EXCISION     right arm   TOTAL KNEE ARTHROPLASTY     left   TOTAL KNEE ARTHROPLASTY  08/20/2012   right knee   TOTAL KNEE ARTHROPLASTY  08/20/2012   Procedure: TOTAL KNEE ARTHROPLASTY;  Surgeon: Loreta Ave, MD;  Location: [facility] OR;  Service: Orthopedics;  Laterality: Right;    Allergies  Allergies  Allergen Reactions   Tuberculin Tests Hives   Other Other (See Comments)    Jehovah's Witness- NO BLOOD PRODUCTS!!    History of Present Illness    Karen Owens has a PMH of DVT, pulmonary embolus, Prinzmetal angina, essential hypertension, asthma, anxiety, hyperlipidemia, NSTEMI, PE, and is a Jehovah's Witness.  She underwent cardiac catheterization which showed no coronary artery disease 06/09/2018.  She was seen by Gillian Shields, NP-C 04/14/2021.  She noted no shortness of breath at rest.  She indicated that her dyspnea on exertion was improving.  She felt that she was overall better.  She denied chest pain, pressure, and tightness.  She had no lower extremity swelling orthopnea or PND.  She  denied palpitations.  She was using a pedal exerciser.  She has 3 sons and 3 daughters and was happy to be a great grandmother.  (Recent birth of first great grandchild)  She contacted the nurse triage line on 02/08/2022.  She reported occasional episodes of chest tightness and shortness of breath.  The episodes started about 1 week prior.  She noted that she has been walking regularly and reported the episodes had dissipated over the last few days however she would still like to be seen.  She presented to the clinic 02/13/22 for evaluation and stated she  had 2 episodes of chest pressure.  The first episode happened after a call that included stressful news about her handicapped son.  Another episode happened after a climbing up a set of stairs.  Both episodes resolved spontaneously after drinking cool water.  We reviewed her cardiac catheterization and previous echocardiogram.  She asked about antianxiety medication (Xanax).  I  gave her the mindfulness stress reduction sheet, salty 6 diet sheet, continued her current medication regimen, and planned follow-up in 4 to 6 months.  I reassured her that her chest pressure did not appear to be related to cardiac issues.  She presented to the clinic 06/12/22 for follow up evaluation and stated she continued to do well from a heart standpoint today.  She continued to notice some increased work of breathing with increased physical activity  such as climbing stairs.  She had normal breathing with normal activities.  She continued to follow a reduced sodium diet.  Her blood pressure was well controlled today.  We reviewed her previous cardiac catheterization and echocardiogram again.  She expressed understanding.  She did notice some funny feeling in her chest 2 weeks ago.  It lasted for few seconds and resolved on its own.  She reported that she was able to drink some cool water and the symptoms went away.  We reviewed possible causes for the discomfort and she was  reassured that it was not related to her heart.  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  We planned follow-up in 6 to 9 months.  She presented to the clinic 10/10/22 for follow-up evaluation and stated she felt well .  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  Reported that when the weather was cooler she prefered to walk in department stores.  She did not like walking in her neighborhood due to the dogs.  She occasionally noticed brief fluttering sensation that lasted for seconds and dissipated on its own or with rest.  We reviewed her cardiac catheterization, recent lab work, and echocardiogram.  She expressed understanding.  We defered repeat echocardiogram at that time and plan for repeat echo to evaluate aortic valve January 2025.  I asked her to increase her physical activity as tolerated and continue her current medication regimen.  Will plan follow-up in 12 months.  She presents to the clinic today for follow-up evaluation and states had increased shortness of breath when she made the appointment.  She then started to go to the gym and use a stationary bike.  Her breathing has significantly  improved.  She reports that she had increased stress with the passing of her oldest son about 1 month ago.  We reviewed her prior echocardiogram.  She expressed understanding.  We also reviewed her need for continued apixaban due to her DVT and history of PE.  I will plan follow-up in December or January.  Today she denies chest pain, shortness of breath, lower extremity edema, fatigue, palpitations, melena, hematuria, hemoptysis, diaphoresis, weakness, presyncope, syncope, orthopnea, and PND.   Home Medications    Prior to Admission medications   Medication Sig Start Date End Date Taking? Authorizing Provider  apixaban (ELIQUIS) 5 MG TABS tablet Take 1 tablet (5 mg total) by mouth 2 (two) times daily. 07/22/19   Burnadette Pop,  MD  APPLE CIDER VINEGAR PO Take 250 mg by mouth daily.    [provider]  carboxymethylcellulose (REFRESH TEARS) 0.5 % SOLN Place 1 drop into both eyes 2 (two) times daily.    [provider]  Coenzyme Q10 (CO Q 10 PO) Take 1 tablet by mouth daily.    [provider]  hydrochlorothiazide (MICROZIDE) 12.5 MG capsule Take 12.5 mg by mouth every other day.    [provider]  ibuprofen (ADVIL,MOTRIN) 800 MG tablet Take 800 mg by mouth every 8 (eight) hours as needed (inflammation/pain).  05/05/18   [provider]  nitroGLYCERIN (NITROSTAT) 0.4 MG SL tablet Place 1 tablet (0.4 mg total) under the tongue every 5 (five) minutes x 3 doses as needed. 07/09/18   Runell Gess, MD  Nutritional Supplements (IMMUNOCAL PO) Take 1 packet by mouth daily. Mix in juice and drink    [provider]  thiamine (VITAMIN B-1) 100 MG tablet Take 100 mg by mouth daily.  [provider]  Vitamin D, Ergocalciferol, (DRISDOL) 1.25 MG (50000 UNIT) CAPS capsule Take 50,000 Units by mouth every 7 (seven) days.    [provider]    Family History    Family History  Problem Relation Age of Onset   Heart attack Brother    She indicated that her brother is alive.  Social History    Social History   Socioeconomic History   Marital status: Divorced    Spouse name: Not on file   Number of children: Not on file   Years of education: Not on file   Highest education level: Not on file  Occupational History   Not on file  Tobacco Use   Smoking status: Former    Types: Cigarettes    Quit date: 09/10/1982    Years since quitting: 40.4   Smokeless tobacco: Never  Substance and Sexual Activity   Alcohol use: Yes    Comment: occasional   Drug use: No   Sexual activity: Never    Birth control/protection: Post-menopausal  Other Topics Concern   Not on file  Social History Narrative   Not on file   Social Determinants of Health    Financial Resource Strain: Not on file  Food Insecurity: Not on file  Transportation Needs: Not on file  Physical Activity: Not on file  Stress: Not on file  Social Connections: Not on file  Intimate Partner Violence: Not on file     Review of Systems    General:  No chills, fever, night sweats or weight changes.  Cardiovascular:  No chest pain, dyspnea on exertion, edema, orthopnea, palpitations, paroxysmal nocturnal dyspnea. Dermatological: No rash, lesions/masses Respiratory: No cough, dyspnea Urologic: No hematuria, dysuria Abdominal:   No nausea, vomiting, diarrhea, bright red blood per rectum, melena, or hematemesis Neurologic:  No visual changes, wkns, changes in mental status. All other systems reviewed and are otherwise negative except as noted above.  Physical Exam    VS:  BP 132/78   Pulse 78   Ht 5\' 6"  (1.676 m)   Wt 209 lb 9.6 oz (95.1 kg)   BMI 33.83 kg/m  , BMI Body mass index is 33.83 kg/m. GEN: Well nourished, well developed, in no acute distress. HEENT: normal. Neck: Supple, no JVD, carotid bruits, or masses. Cardiac: RRR, no murmurs, rubs, or gallops. No clubbing, cyanosis, edema.  Radials/DP/PT 2+ and equal bilaterally.  Respiratory:  Respirations regular and unlabored, clear to auscultation bilaterally. GI: Soft, nontender, nondistended, BS + x 4. MS: no deformity or atrophy. Skin: warm and dry, no rash. Neuro:  Strength and sensation are intact. Psych: Normal affect.  Accessory Clinical Findings    Recent Labs: No results found for requested labs within last 365 days.   Recent Lipid Panel    Component Value Date/Time   CHOL 198 07/13/2019 0745   TRIG 29 07/13/2019 0745   HDL 55 07/13/2019 0745   CHOLHDL 3.6 07/13/2019 0745   VLDL 6 07/13/2019 0745   LDLCALC 137 (H) 07/13/2019 0745    ECG personally reviewed by me today-normal sinus rhythm incomplete right bundle branch block 78 bpm  EKG 06/12/2022 normal sinus rhythm with sinus  arrhythmia moderate voltage criteria for LVH 78 bpm  EKG 02/13/22 normal sinus rhythm left axis deviation moderate voltage criteria for LVH 72 bpm- No acute changes  Cardiac catheterization 06/09/2018  History obtained from chart review.82 y.o. female with small NSTEMI (inferolateral hypokinesis, preserved LVEF, peak troponin 1.0).  She presents now for  diagnostic coronary angiography via the right radial approach.     IMPRESSION: Ms. Lehto has essentially normal coronary arteries and a codominant system with an LVEDP of 18.  Medical therapy will be recommended for "noncardiac chest pain" although coronary vasospasm could also be a possibility.  The patient does admit to a lot of recent stress.  The sheath was removed and a TR band was placed on the right wrist to achieve patent hemostasis.  The patient left the lab in stable condition.  She can probably be discharged home later this morning.   Nanetta Batty. MD, [facility] 06/09/2018 8:26 AM  Diagnostic Dominance: Co-dominant      Echo 02/12/20  IMPRESSIONS     1. Left ventricular ejection fraction, by estimation, is 55 to 60%. The  left ventricle has normal function. The left ventricle has no regional  wall motion abnormalities. Left ventricular diastolic parameters are  consistent with Grade I diastolic  dysfunction (impaired relaxation). Elevated left atrial pressure.   2. Right ventricular systolic function is normal. The right ventricular  size is normal. There is normal pulmonary artery systolic pressure. The  estimated right ventricular systolic pressure is 21.1 mmHg.   3. Left atrial size was mildly dilated.   4. The mitral valve is normal in structure. Mild mitral valve  regurgitation. No evidence of mitral stenosis.   5. The aortic valve is normal in structure. Aortic valve regurgitation is  not visualized. Mild aortic valve stenosis.   6. The inferior vena cava is normal in size with greater than 50%  respiratory  variability, suggesting right atrial pressure of 3 mmHg.  Assessment & Plan   1.  DOE, chest pressure-breathing stable.  Denies chest pain.  History of pulmonary embolus and DVT.  Compliant with apixaban.  Denies bleeding issues.  Reports that a short time ago she noticed increased shortness of breath.  She is now exercising regularly going to the gym and using a exercise bike.  Her shortness of breath has improved.   Continue apixaban Maintain physical activity  Chest discomfort,Nstemi-denies recent episodes of chest pressure or tightness.  Continues to be physically active.  Underwent cardiac catheterization 9/19 and was not noted to have coronary disease. Continue current medical therapy Maintain physical activity   Essential hypertension-BP today 132/78. Continue current medical therapy Maintain blood pressure log  Aortic stenosis-breathing stable, no increased activity intolerance. Noted to have mild aortic stenosis on echocardiogram 6/21.   Order repeat echocardiogram for further prognostication/evaluation  Hyperlipidemia-LDL 154 07/12/22.  Wishes to defer statin therapy Continue Co Q 10 Continue high-fiber diet Maintain physical activity Follows with PCP  Disposition: Follow-up with Dr. Allyson Sabal or me in 6-7 months.  Thomasene Ripple. Bulah Lurie NP-C    01/31/2023, 2:04 PM [facility] [address] Office [phone] Fax [phone]  Notice: This dictation was prepared with Dragon dictation along with smaller phrase technology. Any transcriptional errors that result from this process are unintentional and may not be corrected upon review.  I spent 14 minutes examining this patient, reviewing medications, and using patient centered shared decision making involving her cardiac care.  Prior to her visit I spent greater than 20 minutes reviewing her past medical history,  medications, and prior cardiac tests.

## 2023-01-31 ENCOUNTER — Ambulatory Visit: Payer: HMO | Attending: General Practice | Admitting: General Practice

## 2023-01-31 ENCOUNTER — Encounter: Payer: Self-pay | Admitting: General Practice

## 2023-01-31 VITALS — BP 132/78 | HR 78 | Ht 66.0 in | Wt 209.6 lb

## 2023-01-31 DIAGNOSIS — I252 Old myocardial infarction: Secondary | ICD-10-CM

## 2023-01-31 DIAGNOSIS — I350 Nonrheumatic aortic (valve) stenosis: Secondary | ICD-10-CM

## 2023-01-31 DIAGNOSIS — I10 Essential (primary) hypertension: Secondary | ICD-10-CM

## 2023-01-31 DIAGNOSIS — R0609 Other forms of dyspnea: Secondary | ICD-10-CM | POA: Diagnosis not present

## 2023-01-31 DIAGNOSIS — E782 Mixed hyperlipidemia: Secondary | ICD-10-CM | POA: Diagnosis not present

## 2023-01-31 NOTE — Patient Instructions (Signed)
Medication Instructions:  Your physician recommends that you continue on your current medications as directed. Please refer to the Current Medication list given to you today.  *If you need a refill on your cardiac medications before your next appointment, please call your pharmacy*   Lab Work: NONE ordered at this time of appointment    If you have labs (blood work) drawn today and your tests are completely normal, you will receive your results only by: MyChart Message (if you have MyChart) OR A paper copy in the mail If you have any lab test that is abnormal or we need to change your treatment, we will call you to review the results.   Testing/Procedures: NONE ordered at this time of appointment     Follow-Up: At [facility], you and your health needs are our priority.  As part of our continuing mission to provide you with exceptional heart care, we have created designated Provider Care Teams.  These Care Teams include your primary Cardiologist (physician) and Advanced Practice Providers (APPs -  Physician Assistants and Nurse Practitioners) who all work together to provide you with the care you need, when you need it.  We recommend signing up for the patient portal called "MyChart".  Sign up information is provided on this After Visit Summary.  MyChart is used to connect with patients for Virtual Visits (Telemedicine).  Patients are able to view lab/test results, encounter notes, upcoming appointments, etc.  Non-urgent messages can be sent to your provider as well.   To learn more about what you can do with MyChart, go to [email or web address].    Your next appointment:   6-7 month(s)  Provider:   Nanetta Batty, MD  or Edd Fabian, FNP        Other Instructions Increase physical activity.  Heart-Healthy Eating Plan Eating a healthy diet is important for the health of your heart. A heart-healthy eating plan includes: Eating less unhealthy fats. Eating more  healthy fats. Eating less salt in your food. Salt is also called sodium. Making other changes in your diet. Talk with your doctor or a diet specialist (dietitian) to create an eating plan that is right for you. What is my plan? Your doctor may recommend an eating plan that includes: Total fat: ______% or less of total calories a day. Saturated fat: ______% or less of total calories a day. Cholesterol: less than _________mg a day. Sodium: less than _________mg a day. What are tips for following this plan? Cooking Avoid frying your food. Try to bake, boil, grill, or broil it instead. You can also reduce fat by: Removing the skin from poultry. Removing all visible fats from meats. Steaming vegetables in water or broth. Meal planning  At meals, divide your plate into four equal parts: Fill one-half of your plate with vegetables and green salads. Fill one-fourth of your plate with whole grains. Fill one-fourth of your plate with lean protein foods. Eat 2-4 cups of vegetables per day. One cup of vegetables is: 1 cup (91 g) broccoli or cauliflower florets. 2 medium carrots. 1 large bell pepper. 1 large sweet potato. 1 large tomato. 1 medium white potato. 2 cups (150 g) raw leafy greens. Eat 1-2 cups of fruit per day. One cup of fruit is: 1 small apple 1 large banana 1 cup (237 g) mixed fruit, 1 large orange,  cup (82 g) dried fruit, 1 cup (240 mL) 100% fruit juice. Eat more foods that have soluble fiber. These are apples, broccoli, carrots, beans,  peas, and barley. Try to get 20-30 g of fiber per day. Eat 4-5 servings of nuts, legumes, and seeds per week: 1 serving of dried beans or legumes equals  cup (90 g) cooked. 1 serving of nuts is  oz (12 almonds, 24 pistachios, or 7 walnut halves). 1 serving of seeds equals  oz (8 g). General information Eat more home-cooked food. Eat less restaurant, buffet, and fast food. Limit or avoid alcohol. Limit foods that are high in  starch and sugar. Avoid fried foods. Lose weight if you are overweight. Keep track of how much salt (sodium) you eat. This is important if you have high blood pressure. Ask your doctor to tell you more about this. Try to add vegetarian meals each week. Fats Choose healthy fats. These include olive oil and canola oil, flaxseeds, walnuts, almonds, and seeds. Eat more omega-3 fats. These include salmon, mackerel, sardines, tuna, flaxseed oil, and ground flaxseeds. Try to eat fish at least 2 times each week. Check food labels. Avoid foods with trans fats or high amounts of saturated fat. Limit saturated fats. These are often found in animal products, such as meats, butter, and cream. These are also found in plant foods, such as palm oil, palm kernel oil, and coconut oil. Avoid foods with partially hydrogenated oils in them. These have trans fats. Examples are stick margarine, some tub margarines, cookies, crackers, and other baked goods. What foods should I eat? Fruits All fresh, canned (in natural juice), or frozen fruits. Vegetables Fresh or frozen vegetables (raw, steamed, roasted, or grilled). Green salads. Grains Most grains. Choose whole wheat and whole grains most of the time. Rice and pasta, including brown rice and pastas made with whole wheat. Meats and other proteins Lean, well-trimmed beef, veal, pork, and lamb. Chicken and [place] without skin. All fish and shellfish. Wild duck, rabbit, pheasant, and venison. Egg whites or low-cholesterol egg substitutes. Dried beans, peas, lentils, and tofu. Seeds and most nuts. Dairy Low-fat or nonfat cheeses, including ricotta and mozzarella. Skim or 1% milk that is liquid, powdered, or evaporated. Buttermilk that is made with low-fat milk. Nonfat or low-fat yogurt. Fats and oils Non-hydrogenated (trans-free) margarines. Vegetable oils, including soybean, sesame, sunflower, olive, peanut, safflower, corn, canola, and cottonseed. Salad dressings or  mayonnaise made with a vegetable oil. Beverages Mineral water. Coffee and tea. Diet carbonated beverages. Sweets and desserts Sherbet, gelatin, and fruit ice. Small amounts of dark chocolate. Limit all sweets and desserts. Seasonings and condiments All seasonings and condiments. The items listed above may not be a complete list of foods and drinks you can eat. Contact a dietitian for more options. What foods should I avoid? Fruits Canned fruit in heavy syrup. Fruit in cream or butter sauce. Fried fruit. Limit coconut. Vegetables Vegetables cooked in cheese, cream, or butter sauce. Fried vegetables. Grains Breads that are made with saturated or trans fats, oils, or whole milk. Croissants. Sweet rolls. Donuts. High-fat crackers, such as cheese crackers. Meats and other proteins Fatty meats, such as hot dogs, ribs, sausage, bacon, rib-eye roast or steak. High-fat deli meats, such as salami and bologna. Caviar. Domestic duck and goose. Organ meats, such as liver. Dairy Cream, sour cream, cream cheese, and creamed cottage cheese. Whole-milk cheeses. Whole or 2% milk that is liquid, evaporated, or condensed. Whole buttermilk. Cream sauce or high-fat cheese sauce. Yogurt that is made from whole milk. Fats and oils Meat fat, or shortening. Cocoa butter, hydrogenated oils, palm oil, coconut oil, palm kernel oil. Solid fats  and shortenings, including bacon fat, salt pork, lard, and butter. Nondairy cream substitutes. Salad dressings with cheese or sour cream. Beverages Regular sodas and juice drinks with added sugar. Sweets and desserts Frosting. Pudding. Cookies. Cakes. Pies. Milk chocolate or white chocolate. Buttered syrups. Full-fat ice cream or ice cream drinks. The items listed above may not be a complete list of foods and drinks to avoid. Contact a dietitian for more information. Summary Heart-healthy meal planning includes eating less unhealthy fats, eating more healthy fats, and making  other changes in your diet. Eat a balanced diet. This includes fruits and vegetables, low-fat or nonfat dairy, lean protein, nuts and legumes, whole grains, and heart-healthy oils and fats. This information is not intended to replace advice given to you by your health care provider. Make sure you discuss any questions you have with your health care provider. Document Revised: 10/02/2021 Document Reviewed: 10/02/2021 Elsevier Patient Education  2024 [employer].

## 2023-03-21 DIAGNOSIS — H43821 Vitreomacular adhesion, right eye: Secondary | ICD-10-CM | POA: Diagnosis not present

## 2023-03-21 DIAGNOSIS — H11821 Conjunctivochalasis, right eye: Secondary | ICD-10-CM | POA: Diagnosis not present

## 2023-03-21 DIAGNOSIS — H04123 Dry eye syndrome of bilateral lacrimal glands: Secondary | ICD-10-CM | POA: Diagnosis not present

## 2023-03-21 DIAGNOSIS — Z961 Presence of intraocular lens: Secondary | ICD-10-CM | POA: Diagnosis not present

## 2023-03-21 DIAGNOSIS — H43812 Vitreous degeneration, left eye: Secondary | ICD-10-CM | POA: Diagnosis not present

## 2023-03-21 DIAGNOSIS — H43392 Other vitreous opacities, left eye: Secondary | ICD-10-CM | POA: Diagnosis not present

## 2023-04-05 ENCOUNTER — Ambulatory Visit (HOSPITAL_COMMUNITY)
Admission: EM | Admit: 2023-04-05 | Discharge: 2023-04-05 | Disposition: A | Discharge status: Home / Self Care | Payer: HMO

## 2023-04-05 ENCOUNTER — Encounter (HOSPITAL_COMMUNITY): Payer: Self-pay

## 2023-04-05 DIAGNOSIS — R42 Dizziness and giddiness: Secondary | ICD-10-CM

## 2023-04-05 LAB — POCT FASTING CBG KUC MANUAL ENTRY: POCT Glucose (KUC): 97 mg/dL (ref 70–99)

## 2023-04-05 MED ORDER — MECLIZINE HCL 12.5 MG PO TABS: 12.5000 mg | ORAL_TABLET | Freq: Three times a day (TID) | ORAL | 0 refills | Status: DC | PRN

## 2023-04-05 NOTE — Discharge Instructions (Addendum)
Your symptoms are consistent with vertigo, you Taken a meclizine today in clinic and can take another in 8 hours if the dizziness continues.  If you develop any weakness, headache, falls, or any changes please seek immediate care.  If your dizziness persist, please follow-up with your primary care provider or return to clinic.  Ensure you are taking position changes slowly and using your cane as needed.

## 2023-04-05 NOTE — ED Provider Notes (Signed)
[facility]    CSN: [id number] Arrival date & time: 04/05/23  1000      History   Chief Complaint Chief Complaint  Patient presents with   Dizziness    HPI Karen Owens is a 82 y.o. female.   Patient presents to clinic for dizziness that started this morning when getting out of bed. Dizziness started on Monday and has been intermittent, yesterday she had no dizziness. She does have a history of BPH and has her meclizine with her in clinic, she has not taken it. She does not like taking medications.   She has been using her cane to help her ambulate as she is dizzy with position changes. She denies falls or headache. No shortness of breath or chest pain. She reports compliance with her eliquis, no missed doses.     The history is provided by the patient and medical records.  Dizziness Associated symptoms: no chest pain, no headaches and no shortness of breath     Past Medical History:  Diagnosis Date   Arthritis    Asthma    Complication of anesthesia    difficulty waking up   DVT (deep venous thrombosis) (HCC)    Refusal of blood transfusions as patient is Jehovah's Witness    Thyroid nodule     Patient Active Problem List   Diagnosis Date Noted   Patient is Jehovah's Witness 08/03/2019   Essential hypertension 08/03/2019   Anticoagulated 08/03/2019   Asthma 07/12/2019   Elevated troponin 07/12/2019   History of pulmonary embolus (PE) 07/12/2019   Prinzmetal variant angina (HCC) 04/27/2019   Chest pain 04/26/2019   DVT (deep venous thrombosis) (HCC) 06/10/2018   Pulmonary embolism (HCC) 06/10/2018   HLD (hyperlipidemia) 06/10/2018   History of non-ST elevation myocardial infarction (NSTEMI) 06/06/2018   FIBROIDS, UTERUS 12/11/2007   ELEVATED BLOOD PRESSURE WITHOUT DIAGNOSIS OF HYPERTENSION 12/11/2007   DENTAL CARIES 10/28/2007   CALCANEAL SPUR, RIGHT 10/28/2007   ASTHMA 10/13/2007   GASTROENTERITIS, ACUTE 08/11/2007   Anxiety 07/02/2007    ARTHRITIS, RIGHT SHOULDER 07/02/2007   PAP SMEAR, LGSIL, ABNORMAL 05/27/2007   COLPOSCOPY WITH BIOPSY, HX OF 05/27/2007    Past Surgical History:  Procedure Laterality Date   DILATION AND CURETTAGE OF UTERUS     LEFT HEART CATH AND CORONARY ANGIOGRAPHY N/A 06/09/2018   Procedure: LEFT HEART CATH AND CORONARY ANGIOGRAPHY;  Surgeon: Runell Gess, MD;  [facility];  Service: Cardiovascular;  Laterality: N/A;   LIPOMA EXCISION     right arm   TOTAL KNEE ARTHROPLASTY     left   TOTAL KNEE ARTHROPLASTY  08/20/2012   right knee   TOTAL KNEE ARTHROPLASTY  08/20/2012   Procedure: TOTAL KNEE ARTHROPLASTY;  Surgeon: Loreta Ave, MD;  Location: [facility] OR;  Service: Orthopedics;  Laterality: Right;    OB History   No obstetric history on file.      Home Medications    Prior to Admission medications   Medication Sig Start Date End Date Taking? Authorizing Provider  apixaban (ELIQUIS) 5 MG TABS tablet Take 1 tablet (5 mg total) by mouth 2 (two) times daily. 07/22/19  Yes Burnadette Pop, MD  carboxymethylcellulose (REFRESH TEARS) 0.5 % SOLN Place 1 drop into both eyes 2 (two) times daily.   Yes [provider]  Cholecalciferol (VITAMIN D) 125 MCG (5000 UT) CAPS Take 1 capsule by mouth daily.   Yes [provider]  Coenzyme Q10 (CO Q 10 PO)  Take 1 tablet by mouth daily.   Yes [provider]  ipratropium (ATROVENT) 0.03 % nasal spray Place into both nostrils. 05/15/22  Yes [provider]  APPLE CIDER VINEGAR PO Take 250 mg by mouth daily.    [provider]  hydrochlorothiazide (MICROZIDE) 12.5 MG capsule Take 12.5 mg by mouth every other day.    [provider]  ibuprofen (ADVIL,MOTRIN) 800 MG tablet Take 800 mg by mouth every 8 (eight) hours as needed (inflammation/pain). 05/05/18   [provider]  meclizine (ANTIVERT) 12.5 MG tablet Take 1 tablet (12.5 mg total) by mouth every 8 (eight) hours as needed.  04/05/23   Ayan Yankey, [place] N, FNP  nitroGLYCERIN (NITROSTAT) 0.4 MG SL tablet Place 1 tablet (0.4 mg total) under the tongue every 5 (five) minutes x 3 doses as needed. Patient not taking: Reported on 01/31/2023 07/09/18   Runell Gess, MD  Nutritional Supplements (IMMUNOCAL PO) Take 1 packet by mouth daily. Mix in juice and drink    [provider]  thiamine (VITAMIN B-1) 100 MG tablet Take 100 mg by mouth daily.    [provider]  Vitamin D, Ergocalciferol, (DRISDOL) 1.25 MG (50000 UNIT) CAPS capsule Take 50,000 Units by mouth every 7 (seven) days.    [provider]    Family History Family History  Problem Relation Age of Onset   Heart attack Brother     Social History Social History   Tobacco Use   Smoking status: Former    Current packs/day: 0.00    Types: Cigarettes    Quit date: 09/10/1982    Years since quitting: 40.5   Smokeless tobacco: Never  Substance Use Topics   Alcohol use: Yes    Comment: occasional   Drug use: No     Allergies   Tuberculin tests and Other   Review of Systems Review of Systems  Respiratory:  Negative for cough, shortness of breath and wheezing.   Cardiovascular:  Negative for chest pain.  Neurological:  Positive for dizziness. Negative for headaches.     Physical Exam Triage Vital Signs ED Triage Vitals [04/05/23 1032]  Encounter Vitals Group     BP 134/71     Systolic BP Percentile      Diastolic BP Percentile      Pulse Rate 80     Resp 16     Temp 98.7 F (37.1 C)     Temp Source Oral     SpO2 95 %     Weight      Height 5\' 6"  (1.676 m)     Head Circumference      Peak Flow      Pain Score 0     Pain Loc      Pain Education      Exclude from Growth Chart    No data found.  Updated Vital Signs BP 134/71 (BP Location: Left Arm)   Pulse 80   Temp 98.7 F (37.1 C) (Oral)   Resp 16   Ht 5\' 6"  (1.676 m)   SpO2 95%   BMI 33.83 kg/m   Visual Acuity Right Eye Distance:   Left Eye  Distance:   Bilateral Distance:    Right Eye Near:   Left Eye Near:    Bilateral Near:     Physical Exam Vitals and nursing note reviewed.  Constitutional:      Appearance: Normal appearance.  HENT:     Head: Normocephalic and atraumatic.  Right Ear: External ear normal.     Left Ear: External ear normal.     Nose: Nose normal.     Mouth/Throat:     Mouth: Mucous membranes are moist.  Eyes:     General: No visual field deficit.    Conjunctiva/sclera: Conjunctivae normal.  Cardiovascular:     Rate and Rhythm: Normal rate.  Pulmonary:     Effort: Pulmonary effort is normal. No respiratory distress.  Musculoskeletal:        General: Normal range of motion.  Skin:    General: Skin is warm and dry.  Neurological:     General: No focal deficit present.     Mental Status: She is alert and oriented to person, place, and time.     GCS: GCS eye subscore is 4. GCS verbal subscore is 5. GCS motor subscore is 6.     Cranial Nerves: Cranial nerves 2-12 are intact. No cranial nerve deficit, dysarthria or facial asymmetry.     Sensory: Sensation is intact.     Motor: Motor function is intact.  Psychiatric:        Mood and Affect: Mood normal.        Behavior: Behavior normal. Behavior is cooperative.      UC Treatments / Results  Labs (all labs ordered are listed, but only abnormal results are displayed) Labs Reviewed  POCT FASTING CBG [facility] MANUAL ENTRY    EKG   Radiology No results found.  Procedures Procedures (including critical care time)  Medications Ordered in UC Medications - No data to display  Initial Impression / Assessment and Plan / UC Course  I have reviewed the triage vital signs and the nursing notes.  Pertinent labs & imaging results that were available during my care of the patient were reviewed by me and considered in my medical decision making (see chart for details).  Vitals and triage reviewed, patient is hemodynamically stable.  Reports  dizziness with position changes.  CBG 97 in clinic.  Does have a history of BPH, has meclizine with her in clinic.  GCS 15, cranial nerves II through XII intact.  No recent falls or head trauma.  Encouraged to take meclizine as prescribed.  Discussed that we cannot rule out any brain bleeds or neurological abnormalities without a CT scan, discussed that this is less likely due to history of vertigo and symptoms.  Regardless, strict emergency room return precautions given, no questions at this time.     Final Clinical Impressions(s) / UC Diagnoses   Final diagnoses:  Vertigo  Dizziness     Discharge Instructions      Your symptoms are consistent with vertigo, you Taken a meclizine today in clinic and can take another in 8 hours if the dizziness continues.  If you develop any weakness, headache, falls, or any changes please seek immediate care.  If your dizziness persist, please follow-up with your primary care provider or return to clinic.  Ensure you are taking position changes slowly and using your cane as needed.     ED Prescriptions     Medication Sig Dispense Auth. Provider   meclizine (ANTIVERT) 12.5 MG tablet Take 1 tablet (12.5 mg total) by mouth every 8 (eight) hours as needed. 30 tablet Tegan Burnside, [place], [place]      PDMP not reviewed this encounter.   Lenzie Montesano, [place] N, [place] 04/05/23 [phone]

## 2023-04-05 NOTE — ED Triage Notes (Signed)
Patient here today with c/o dizziness since Monday. Seemed to have improved yesterday but when she woke up this morning she was dizzy again.

## 2023-04-17 DIAGNOSIS — H35353 Cystoid macular degeneration, bilateral: Secondary | ICD-10-CM | POA: Diagnosis not present

## 2023-04-17 DIAGNOSIS — H43821 Vitreomacular adhesion, right eye: Secondary | ICD-10-CM | POA: Diagnosis not present

## 2023-05-08 DIAGNOSIS — H35353 Cystoid macular degeneration, bilateral: Secondary | ICD-10-CM | POA: Diagnosis not present

## 2023-05-08 DIAGNOSIS — H43821 Vitreomacular adhesion, right eye: Secondary | ICD-10-CM | POA: Diagnosis not present

## 2023-05-16 ENCOUNTER — Telehealth: Payer: Self-pay | Admitting: General Practice

## 2023-05-16 NOTE — Telephone Encounter (Signed)
Pharmacy please advise on holding Eliquis prior to retinal surgery scheduled for 05/22/23. Thank you.   Perlie Gold, PA-C

## 2023-05-16 NOTE — Telephone Encounter (Signed)
   Pre-operative Risk Assessment    Patient Name: Karen Owens  DOB: [birth year] MRN: [id number]      Request for Surgical Clearance    Procedure:   Retinal surgery   Date of Surgery:  Clearance 05/22/23                                 Surgeon:  Dr. Luciana Axe  Surgeon's Group or Practice Name:  [facility]  Phone number:  [phone]  Fax number:  [phone]    Type of Clearance Requested:   - Medical    Type of Anesthesia:  MAC   Additional requests/questions:   Olegario Messier stated the Anesthesiologist is requesting clearance, not the provider so from the anesthesiologist stand point they are not requesting she hold eliquis.   Alben Spittle   05/16/2023, 11:04 AM

## 2023-05-16 NOTE — Telephone Encounter (Signed)
Patient with diagnosis of DVT and PE on Eliquis for anticoagulation.    Procedure: retinal surgery  Date of procedure: 05/22/23   CrCl 56 ml/min  Per office protocol, patient can hold Eliquis for 2 days prior to procedure if needed.   **This guidance is not considered finalized until pre-operative APP has relayed final recommendations.**

## 2023-05-16 NOTE — Telephone Encounter (Signed)
   Name: Karen Owens  DOB: [birth year]  MRN: [id number]  Primary Cardiologist: Nanetta Batty, MD   Preoperative team, please contact this patient and set up a phone call appointment for further preoperative risk assessment. Please obtain consent and complete medication review. Thank you for your help.  Per office protocol, patient can hold Eliquis for 2 days prior to procedure if needed. However surgeon is not requesting that Eliquis be held for procedure.   I confirm that guidance regarding antiplatelet and oral anticoagulation therapy has been completed and, if necessary, noted below.     Joni Reining, NP 05/16/2023, 12:20 PM [facility]

## 2023-05-17 ENCOUNTER — Telehealth: Payer: Self-pay

## 2023-05-17 NOTE — Telephone Encounter (Signed)
Routed incorrectly. Sent to Pre-Op Call Back

## 2023-05-17 NOTE — Telephone Encounter (Signed)
I left a message for the patient to call our office back to schedule a tele visit for pre-op clearance.  

## 2023-05-17 NOTE — Telephone Encounter (Signed)
  Patient Consent for Virtual Visit        Karen Owens has provided verbal consent on 05/17/2023 for a virtual visit (video or telephone).   CONSENT FOR VIRTUAL VISIT FOR:  Karen Owens  By participating in this virtual visit I agree to the following:  I hereby voluntarily request, consent and authorize [facility] and its employed or contracted physicians, physician assistants, nurse practitioners or other licensed health care professionals (the Practitioner), to provide me with telemedicine health care services (the "Services") as deemed necessary by the treating Practitioner. I acknowledge and consent to receive the Services by the Practitioner via telemedicine. I understand that the telemedicine visit will involve communicating with the Practitioner through live audiovisual communication technology and the disclosure of certain medical information by electronic transmission. I acknowledge that I have been given the opportunity to request an in-person assessment or other available alternative prior to the telemedicine visit and am voluntarily participating in the telemedicine visit.  I understand that I have the right to withhold or withdraw my consent to the use of telemedicine in the course of my care at any time, without affecting my right to future care or treatment, and that the Practitioner or I may terminate the telemedicine visit at any time. I understand that I have the right to inspect all information obtained and/or recorded in the course of the telemedicine visit and may receive copies of available information for a reasonable fee.  I understand that some of the potential risks of receiving the Services via telemedicine include:  Delay or interruption in medical evaluation due to technological equipment failure or disruption; Information transmitted may not be sufficient (e.g. poor resolution of images) to allow for appropriate medical decision making by the Practitioner;  and/or  In rare instances, security protocols could fail, causing a breach of personal health information.  Furthermore, I acknowledge that it is my responsibility to provide information about my medical history, conditions and care that is complete and accurate to the best of my ability. I acknowledge that Practitioner's advice, recommendations, and/or decision may be based on factors not within their control, such as incomplete or inaccurate data provided by me or distortions of diagnostic images or specimens that may result from electronic transmissions. I understand that the practice of medicine is not an exact science and that Practitioner makes no warranties or guarantees regarding treatment outcomes. I acknowledge that a copy of this consent can be made available to me via my patient portal [facility] MyChart), or I can request a printed copy by calling the office of [facility].    I understand that my insurance will be billed for this visit.   I have read or had this consent read to me. I understand the contents of this consent, which adequately explains the benefits and risks of the Services being provided via telemedicine.  I have been provided ample opportunity to ask questions regarding this consent and the Services and have had my questions answered to my satisfaction. I give my informed consent for the services to be provided through the use of telemedicine in my medical care

## 2023-05-17 NOTE — Telephone Encounter (Signed)
Spoke with patient who is agreeable to do a tele visit on 9/9 at 3:20 pm. Med rec and consent done.

## 2023-05-19 NOTE — Progress Notes (Unsigned)
Virtual Visit via Telephone Note   Because of Karen Owens's co-morbid illnesses, she is at least at moderate risk for complications without adequate follow up.  This format is felt to be most appropriate for this patient at this time.  The patient did not have access to video technology/had technical difficulties with video requiring transitioning to audio format only (telephone).  All issues noted in this document were discussed and addressed.  No physical exam could be performed with this format.  Please refer to the patient's chart for her consent to telehealth for [facility].  Evaluation Performed:  Preoperative cardiovascular risk assessment _____________   Date:  05/20/2023   Patient ID:  Karen Owens, DOB [birth year], MRN [id number] Patient Location:  Home Provider location:   Office  Primary Care Provider:  Laurann Montana, MD Primary Cardiologist:  Nanetta Batty, MD  Chief Complaint / Patient Profile   82 y.o. y/o female with a h/o DVT, pulmonary embolus, Prinzmetal angina, essential hypertension, asthma, anxiety, hyperlipidemia, NSTEMI, PE, and is a TEFL teacher Witness. She underwent cardiac catheterization which showed no coronary artery disease 06/09/2018.    She is pending retinal surgery by Dr. Ferne Reus 05/22/2023 and presents today for telephonic preoperative cardiovascular risk assessment.   History of Present Illness    Karen Owens is a 82 y.o. female who presents via audio/video conferencing for a telehealth visit today.  Pt was last seen in cardiology clinic on 01/31/2023 by Edd Fabian, NP.  At that time Karen Owens was doing well   The patient is now pending procedure as outlined above. Since her last visit, she has remained active, does a lot of walking, can climb stairs, is medically compliant, denies any bleeding bruising or hemoptysis on Eliquis.  She has been already given information from her surgeon to hold Eliquis and does not require this  recommendation from cardiology.  She is uncertain about the surgery date as it has been canceled pending our cardiac evaluation and preoperative instructions concerning eyedrops and activities.  Past Medical History    Past Medical History:  Diagnosis Date   Arthritis    Asthma    Complication of anesthesia    difficulty waking up   DVT (deep venous thrombosis) (HCC)    Refusal of blood transfusions as patient is Jehovah's Witness    Thyroid nodule    Past Surgical History:  Procedure Laterality Date   DILATION AND CURETTAGE OF UTERUS     LEFT HEART CATH AND CORONARY ANGIOGRAPHY N/A 06/09/2018   Procedure: LEFT HEART CATH AND CORONARY ANGIOGRAPHY;  Surgeon: Runell Gess, MD;  [facility];  Service: Cardiovascular;  Laterality: N/A;   LIPOMA EXCISION     right arm   TOTAL KNEE ARTHROPLASTY     left   TOTAL KNEE ARTHROPLASTY  08/20/2012   right knee   TOTAL KNEE ARTHROPLASTY  08/20/2012   Procedure: TOTAL KNEE ARTHROPLASTY;  Surgeon: Loreta Ave, MD;  Location: [facility];  Service: Orthopedics;  Laterality: Right;    Allergies  Allergies  Allergen Reactions   Tuberculin Tests Hives   Other Other (See Comments)    Jehovah's Witness- NO BLOOD PRODUCTS!!    Home Medications    Prior to Admission medications   Medication Sig Start Date End Date Taking? Authorizing Provider  apixaban (ELIQUIS) 5 MG TABS tablet Take 1 tablet (5 mg total) by mouth 2 (two) times daily. 07/22/19   Burnadette Pop, MD  APPLE CIDER  VINEGAR PO Take 250 mg by mouth daily.    [provider]  carboxymethylcellulose (REFRESH TEARS) 0.5 % SOLN Place 1 drop into both eyes 2 (two) times daily.    [provider]  Cholecalciferol (VITAMIN D) 125 MCG (5000 UT) CAPS Take 1 capsule by mouth daily.    [provider]  Coenzyme Q10 (CO Q 10 PO) Take 1 tablet by mouth daily.    [provider]  hydrochlorothiazide (MICROZIDE) 12.5 MG capsule Take 12.5 mg by  mouth every other day.    [provider]  ibuprofen (ADVIL,MOTRIN) 800 MG tablet Take 800 mg by mouth every 8 (eight) hours as needed (inflammation/pain). 05/05/18   [provider]  ipratropium (ATROVENT) 0.03 % nasal spray Place into both nostrils. 05/15/22   [provider]  meclizine (ANTIVERT) 12.5 MG tablet Take 1 tablet (12.5 mg total) by mouth every 8 (eight) hours as needed. 04/05/23   [place], [place] N, FNP  nitroGLYCERIN (NITROSTAT) 0.4 MG SL tablet Place 1 tablet (0.4 mg total) under the tongue every 5 (five) minutes x 3 doses as needed. 07/09/18   Runell Gess, MD  Nutritional Supplements (IMMUNOCAL PO) Take 1 packet by mouth daily. Mix in juice and drink    [provider]  thiamine (VITAMIN B-1) 100 MG tablet Take 100 mg by mouth daily.    [provider]  Vitamin D, Ergocalciferol, (DRISDOL) 1.25 MG (50000 UNIT) CAPS capsule Take 50,000 Units by mouth every 7 (seven) days.    [provider]    Physical Exam    Vital Signs:  CANEI STROHMEIER does not have vital signs available for review today.   Given telephonic nature of communication, physical exam is limited. AAOx3. NAD. Normal affect.  Speech and respirations are unlabored.  Accessory Clinical Findings    None  Assessment & Plan    1.  Preoperative Cardiovascular Risk Assessment:  According to the Revised Cardiac Risk Index (RCRI), her Perioperative Risk of Major Cardiac Event is (%): 0.4  Her Functional Capacity in METs is: 8.33 according to the [facility] Activity Status Index (DASI).   Olegario Messier stated the Anesthesiologist is requesting clearance, not the provider so from the anesthesiologist stand point they are not requesting she hold Eliquis.   The patient was advised that if she develops new symptoms prior to surgery to contact our office to arrange for a follow-up visit, and she verbalized understanding.  Therefore, based on ACC/AHA guidelines, patient would be  at acceptable risk for the planned procedure without further cardiovascular testing. I will route this recommendation to the requesting party via Epic fax function.   A copy of this note will be routed to requesting surgeon.   Time:   Today, I have spent 10 minutes with the patient with telehealth technology discussing medical history, symptoms, and management plan.     Joni Reining, NP  05/20/2023, 3:28 PM

## 2023-05-20 ENCOUNTER — Ambulatory Visit: Payer: HMO | Attending: Cardiovascular Disease

## 2023-05-20 DIAGNOSIS — Z01810 Encounter for preprocedural cardiovascular examination: Secondary | ICD-10-CM | POA: Diagnosis not present

## 2023-05-20 DIAGNOSIS — Z01818 Encounter for other preprocedural examination: Secondary | ICD-10-CM

## 2023-05-22 DIAGNOSIS — H43821 Vitreomacular adhesion, right eye: Secondary | ICD-10-CM | POA: Diagnosis not present

## 2023-05-29 DIAGNOSIS — Z961 Presence of intraocular lens: Secondary | ICD-10-CM | POA: Diagnosis not present

## 2023-05-29 DIAGNOSIS — H43821 Vitreomacular adhesion, right eye: Secondary | ICD-10-CM | POA: Diagnosis not present

## 2023-05-29 DIAGNOSIS — H35353 Cystoid macular degeneration, bilateral: Secondary | ICD-10-CM | POA: Diagnosis not present

## 2023-07-28 NOTE — Progress Notes (Unsigned)
Cardiology Clinic Note   Patient Name: Karen Owens Date of Encounter: 07/31/2023  Primary Care Provider:  Laurann Montana, MD Primary Cardiologist:  Nanetta Batty, MD  Patient Profile    Karen Owens is a 82 year old female presents to the clinic today for follow-up evaluation of her shortness of breath.  Past Medical History    Past Medical History:  Diagnosis Date   Arthritis    Asthma    Complication of anesthesia    difficulty waking up   DVT (deep venous thrombosis) (HCC)    Refusal of blood transfusions as patient is Jehovah's Witness    Thyroid nodule    Past Surgical History:  Procedure Laterality Date   DILATION AND CURETTAGE OF UTERUS     LEFT HEART CATH AND CORONARY ANGIOGRAPHY N/A 06/09/2018   Procedure: LEFT HEART CATH AND CORONARY ANGIOGRAPHY;  Surgeon: Runell Gess, MD;  Location: [facility];  Service: Cardiovascular;  Laterality: N/A;   LIPOMA EXCISION     right arm   TOTAL KNEE ARTHROPLASTY     left   TOTAL KNEE ARTHROPLASTY  08/20/2012   right knee   TOTAL KNEE ARTHROPLASTY  08/20/2012   Procedure: TOTAL KNEE ARTHROPLASTY;  Surgeon: Loreta Ave, MD;  Location: [facility] OR;  Service: Orthopedics;  Laterality: Right;    Allergies  Allergies  Allergen Reactions   Tuberculin Tests Hives   Other Other (See Comments)    Jehovah's Witness- NO BLOOD PRODUCTS!!    History of Present Illness    Karen Owens has a PMH of DVT, pulmonary embolus, Prinzmetal angina, essential hypertension, asthma, anxiety, hyperlipidemia, NSTEMI, PE, and is a Jehovah's Witness.  She underwent cardiac catheterization which showed no coronary artery disease 06/09/2018.  She was seen by Gillian Shields, NP-C 04/14/2021.  She noted no shortness of breath at rest.  She indicated that her dyspnea on exertion was improving.  She felt that she was overall better.  She denied chest pain, pressure, and tightness.  She had no lower extremity swelling orthopnea or PND.  She  denied palpitations.  She was using a pedal exerciser.  She has 3 sons and 3 daughters and was happy to be a great grandmother.  (Recent birth of first great grandchild)  She contacted the nurse triage line on 02/08/2022.  She reported occasional episodes of chest tightness and shortness of breath.  The episodes started about 1 week prior.  She noted that she has been walking regularly and reported the episodes had dissipated over the last few days however she would still like to be seen.  She presented to the clinic 02/13/22 for evaluation and stated she  had 2 episodes of chest pressure.  The first episode happened after a call that included stressful news about her handicapped son.  Another episode happened after a climbing up a set of stairs.  Both episodes resolved spontaneously after drinking cool water.  We reviewed her cardiac catheterization and previous echocardiogram.  She asked about antianxiety medication (Xanax).  I  gave her the mindfulness stress reduction sheet, salty 6 diet sheet, continued her current medication regimen, and planned follow-up in 4 to 6 months.  I reassured her that her chest pressure did not appear to be related to cardiac issues.  She presented to the clinic 06/12/22 for follow up evaluation and stated she continued to do well from a heart standpoint today.  She continued to notice some increased work of breathing with increased physical activity  such as climbing stairs.  She had normal breathing with normal activities.  She continued to follow a reduced sodium diet.  Her blood pressure was well controlled today.  We reviewed her previous cardiac catheterization and echocardiogram again.  She expressed understanding.  She did notice some funny feeling in her chest 2 weeks ago.  It lasted for few seconds and resolved on its own.  She reported that she was able to drink some cool water and the symptoms went away.  We reviewed possible causes for the discomfort and she was  reassured that it was not related to her heart.  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  We planned follow-up in 6 to 9 months.  She presented to the clinic 10/10/22 for follow-up evaluation and stated she felt well .  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  Reported that when the weather was cooler she prefered to walk in department stores.  She did not like walking in her neighborhood due to the dogs.  She occasionally noticed brief fluttering sensation that lasted for seconds and dissipated on its own or with rest.  We reviewed her cardiac catheterization, recent lab work, and echocardiogram.  She expressed understanding.  We defered repeat echocardiogram at that time and plan for repeat echo to evaluate aortic valve January 2025.  I asked her to increase her physical activity as tolerated and continue her current medication regimen.  Will plan follow-up in 12 months.  She presented to the clinic 01/31/23 for follow-up evaluation and stated she had increased shortness of breath when she made the appointment.  She then started to go to the gym and using a stationary bike.  Her breathing had significantly  improved.  She reported that she had increased stress with the passing of her oldest son about 1 month before.  We reviewed her prior echocardiogram.  She expressed understanding.  We also reviewed her need for continued apixaban due to her DVT and history of PE.  I planned follow-up in December or January.  She presents to the clinic today for follow-up evaluation and states she continues to feel well.  She reports that she has been having less energy.  She is interested in taking a B complex vitamin.  We reviewed her diet.  She continues to eat low-sodium.  She has been not going to the gym as often due to the uptake in COVID infections.  She continues to use her stationary pedal bike at home.  We reviewed her last clinic  visit and most recent lipid panel.  I have asked her to increase her physical activity as tolerated, she may take a B complex vitamin, continue her other medications, and we will plan follow-up in 6 months..  Today she denies chest pain, shortness of breath, lower extremity edema, fatigue, palpitations, melena, hematuria, hemoptysis, diaphoresis, weakness, presyncope, syncope, orthopnea, and PND.   Home Medications    Prior to Admission medications   Medication Sig Start Date End Date Taking? Authorizing Provider  apixaban (ELIQUIS) 5 MG TABS tablet Take 1 tablet (5 mg total) by mouth 2 (two) times daily. 07/22/19   Burnadette Pop, MD  APPLE CIDER VINEGAR PO Take 250 mg by mouth daily.    [provider]  carboxymethylcellulose (REFRESH TEARS) 0.5 % SOLN Place 1 drop into both eyes 2 (two) times daily.    [provider]  Coenzyme Q10 (CO Q 10 PO) Take 1 tablet by mouth  daily.    [provider]  hydrochlorothiazide (MICROZIDE) 12.5 MG capsule Take 12.5 mg by mouth every other day.    [provider]  ibuprofen (ADVIL,MOTRIN) 800 MG tablet Take 800 mg by mouth every 8 (eight) hours as needed (inflammation/pain).  05/05/18   [provider]  nitroGLYCERIN (NITROSTAT) 0.4 MG SL tablet Place 1 tablet (0.4 mg total) under the tongue every 5 (five) minutes x 3 doses as needed. 07/09/18   Runell Gess, MD  Nutritional Supplements (IMMUNOCAL PO) Take 1 packet by mouth daily. Mix in juice and drink    [provider]  thiamine (VITAMIN B-1) 100 MG tablet Take 100 mg by mouth daily.    [provider]  Vitamin D, Ergocalciferol, (DRISDOL) 1.25 MG (50000 UNIT) CAPS capsule Take 50,000 Units by mouth every 7 (seven) days.    [provider]    Family History    Family History  Problem Relation Age of Onset   Heart attack Brother    She indicated that her brother is alive.  Social History    Social History    Socioeconomic History   Marital status: Divorced    Spouse name: Not on file   Number of children: Not on file   Years of education: Not on file   Highest education level: Not on file  Occupational History   Not on file  Tobacco Use   Smoking status: Former    Current packs/day: 0.00    Types: Cigarettes    Quit date: 09/10/1982    Years since quitting: 40.9   Smokeless tobacco: Never  Substance and Sexual Activity   Alcohol use: Yes    Comment: occasional   Drug use: No   Sexual activity: Never    Birth control/protection: Post-menopausal  Other Topics Concern   Not on file  Social History Narrative   Not on file   Social Determinants of Health   Financial Resource Strain: Not on file  Food Insecurity: Not on file  Transportation Needs: Not on file  Physical Activity: Not on file  Stress: Not on file  Social Connections: Not on file  Intimate Partner Violence: Not on file     Review of Systems    General:  No chills, fever, night sweats or weight changes.  Cardiovascular:  No chest pain, dyspnea on exertion, edema, orthopnea, palpitations, paroxysmal nocturnal dyspnea. Dermatological: No rash, lesions/masses Respiratory: No cough, dyspnea Urologic: No hematuria, dysuria Abdominal:   No nausea, vomiting, diarrhea, bright red blood per rectum, melena, or hematemesis Neurologic:  No visual changes, wkns, changes in mental status. All other systems reviewed and are otherwise negative except as noted above.  Physical Exam    VS:  BP 134/80   Pulse 64   Ht 5\' 6"  (1.676 m)   Wt 205 lb 6.4 oz (93.2 kg)   BMI 33.15 kg/m  , BMI Body mass index is 33.15 kg/m. GEN: Well nourished, well developed, in no acute distress. HEENT: normal. Neck: Supple, no JVD, carotid bruits, or masses. Cardiac: RRR, no murmurs, rubs, or gallops. No clubbing, cyanosis, edema.  Radials/DP/PT 2+ and equal bilaterally.  Respiratory:  Respirations regular and unlabored, clear to auscultation  bilaterally. GI: Soft, nontender, nondistended, BS + x 4. MS: no deformity or atrophy. Skin: warm and dry, no rash. Neuro:  Strength and sensation are intact. Psych: Normal affect.  Accessory Clinical Findings    Recent Labs: No results found for requested labs within last 365 days.  Recent Lipid Panel    Component Value Date/Time   CHOL 198 07/13/2019 0745   TRIG 29 07/13/2019 0745   HDL 55 07/13/2019 0745   CHOLHDL 3.6 07/13/2019 0745   VLDL 6 07/13/2019 0745   LDLCALC 137 (H) 07/13/2019 0745    ECG personally reviewed by me today-none today.  EKG 01/31/2023 normal sinus rhythm incomplete right bundle branch block 78 bpm  EKG 06/12/2022 normal sinus rhythm with sinus arrhythmia moderate voltage criteria for LVH 78 bpm  EKG 02/13/22 normal sinus rhythm left axis deviation moderate voltage criteria for LVH 72 bpm- No acute changes  Cardiac catheterization 06/09/2018  History obtained from chart review.82 y.o. female with small NSTEMI (inferolateral hypokinesis, preserved LVEF, peak troponin 1.0).  She presents now for diagnostic coronary angiography via the right radial approach.     IMPRESSION: Ms. Capurro has essentially normal coronary arteries and a codominant system with an LVEDP of 18.  Medical therapy will be recommended for "noncardiac chest pain" although coronary vasospasm could also be a possibility.  The patient does admit to a lot of recent stress.  The sheath was removed and a TR band was placed on the right wrist to achieve patent hemostasis.  The patient left the lab in stable condition.  She can probably be discharged home later this morning.   Nanetta Batty. MD, [facility] 06/09/2018 8:26 AM  Diagnostic Dominance: Co-dominant      Echo 02/12/20  IMPRESSIONS     1. Left ventricular ejection fraction, by estimation, is 55 to 60%. The  left ventricle has normal function. The left ventricle has no regional  wall motion abnormalities. Left ventricular  diastolic parameters are  consistent with Grade I diastolic  dysfunction (impaired relaxation). Elevated left atrial pressure.   2. Right ventricular systolic function is normal. The right ventricular  size is normal. There is normal pulmonary artery systolic pressure. The  estimated right ventricular systolic pressure is 21.1 mmHg.   3. Left atrial size was mildly dilated.   4. The mitral valve is normal in structure. Mild mitral valve  regurgitation. No evidence of mitral stenosis.   5. The aortic valve is normal in structure. Aortic valve regurgitation is  not visualized. Mild aortic valve stenosis.   6. The inferior vena cava is normal in size with greater than 50%  respiratory variability, suggesting right atrial pressure of 3 mmHg.  Assessment & Plan   1. Chest discomfort,Nstemi-no chest pain today.  Has been going to the gym last due to uptick in COVID infections.  She continues to use her stationary bike at home fairly regularly.  Had cardiac catheterization 9/19 and was not noted to have coronary disease. Continue current medical therapy Thayer Ohm physical activity as tolerated-increase stationary bike sessions Continue heart healthy low-sodium diet   DOE-breathing continues at baseline.  Has history of pulmonary embolus and DVT.  She reports being compliant with apixaban.  Denies bleeding issues.    Continue apixaban Maintain physical activity   Essential hypertension-BP today 134/80. Continue current medical therapy Maintain blood pressure log  Aortic stenosis-previously noted to have mild aortic stenosis on echocardiogram 6/21.  Echocardiogram ordered at last clinic visit but was not completed. Reorder  echocardiogram   Hyperlipidemia-LDL 154 07/12/22.  Previously wished to defer statin therapy.   Continue Co Q 10 Continue high-fiber diet Maintain physical activity Lipids in 1-2 weeks  Disposition: Follow-up with Dr. Allyson Sabal or me in 6 months.  Thomasene Ripple. Tiler Brandis NP-C      07/31/2023,  12:06 PM [facility] [address] Office [phone] Fax [phone]  Notice: This dictation was prepared with Dragon dictation along with smaller phrase technology. Any transcriptional errors that result from this process are unintentional and may not be corrected upon review.  I spent 14 minutes examining this patient, reviewing medications, and using patient centered shared decision making involving her cardiac care.  Prior to her visit I spent greater than 20 minutes reviewing her past medical history,  medications, and prior cardiac tests.

## 2023-07-30 ENCOUNTER — Ambulatory Visit: Payer: HMO | Admitting: General Practice

## 2023-07-31 ENCOUNTER — Encounter: Payer: Self-pay | Admitting: General Practice

## 2023-07-31 ENCOUNTER — Ambulatory Visit: Payer: HMO | Attending: General Practice | Admitting: General Practice

## 2023-07-31 VITALS — BP 134/80 | HR 64 | Ht 66.0 in | Wt 205.4 lb

## 2023-07-31 DIAGNOSIS — E782 Mixed hyperlipidemia: Secondary | ICD-10-CM | POA: Diagnosis not present

## 2023-07-31 DIAGNOSIS — I10 Essential (primary) hypertension: Secondary | ICD-10-CM

## 2023-07-31 DIAGNOSIS — I252 Old myocardial infarction: Secondary | ICD-10-CM

## 2023-07-31 DIAGNOSIS — R0609 Other forms of dyspnea: Secondary | ICD-10-CM | POA: Diagnosis not present

## 2023-07-31 DIAGNOSIS — I350 Nonrheumatic aortic (valve) stenosis: Secondary | ICD-10-CM | POA: Diagnosis not present

## 2023-07-31 MED ORDER — B COMPLEX PO CAPS: 1.0000 | ORAL_CAPSULE | Freq: Every day | ORAL | Status: AC

## 2023-07-31 NOTE — Patient Instructions (Addendum)
Medication Instructions:    Stop taking Vit B 1  Start taking B- complex vitamin - over the counter  tablet or capsule one daily   *If you need a refill on your cardiac medications before your next appointment, please call your pharmacy*   Lab Work: 1 to 2 weeks   Lipid - fasting   If you have labs (blood work) drawn today and your tests are completely normal, you will receive your results only by: MyChart Message (if you have MyChart) OR A paper copy in the mail If you have any lab test that is abnormal or we need to change your treatment, we will call you to review the results.   Testing/Procedures:  Not needed  Follow-Up: At [facility], you and your health needs are our priority.  As part of our continuing mission to provide you with exceptional heart care, we have created designated Provider Care Teams.  These Care Teams include your primary Cardiologist (physician) and Advanced Practice Providers (APPs -  Physician Assistants and Nurse Practitioners) who all work together to provide you with the care you need, when you need it.     Your next appointment:   6 month(s)  The format for your next appointment:   In Person  Provider:    Edd Fabian, FNP       Other Instructions  Continue  your healthy diet    Increase exercise-- Your physician discussed the importance of regular exercise and recommended that you start or continue a regular exercise program for good health.

## 2023-08-27 DIAGNOSIS — H35353 Cystoid macular degeneration, bilateral: Secondary | ICD-10-CM | POA: Diagnosis not present

## 2023-08-27 DIAGNOSIS — H35351 Cystoid macular degeneration, right eye: Secondary | ICD-10-CM | POA: Diagnosis not present

## 2023-08-27 DIAGNOSIS — H43821 Vitreomacular adhesion, right eye: Secondary | ICD-10-CM | POA: Diagnosis not present

## 2023-09-16 ENCOUNTER — Ambulatory Visit (HOSPITAL_COMMUNITY): Payer: HMO | Attending: General Practice

## 2023-09-16 DIAGNOSIS — I350 Nonrheumatic aortic (valve) stenosis: Secondary | ICD-10-CM | POA: Insufficient documentation

## 2023-09-16 LAB — ECHOCARDIOGRAM COMPLETE
AR max vel: 1.12 cm2
AV Area VTI: 1.18 cm2
AV Area mean vel: 1.17 cm2
AV Mean grad: 12.5 mm[Hg]
AV Peak grad: 23.5 mm[Hg]
Ao pk vel: 2.43 m/s
Area-P 1/2: 4.85 cm2
S' Lateral: 4 cm

## 2023-09-19 ENCOUNTER — Telehealth: Payer: Self-pay | Admitting: General Practice

## 2023-09-19 NOTE — Telephone Encounter (Signed)
 Called pt to give echo results. Pt stated once again she would like [place] to contact her daughter with results.

## 2023-09-19 NOTE — Telephone Encounter (Signed)
 Called daughter to give echo results. No answer. Left a message to ball back.   Josie LPN

## 2023-09-19 NOTE — Telephone Encounter (Signed)
 Patient is returning call to discuss echo results.

## 2023-09-19 NOTE — Telephone Encounter (Deleted)
 Please contact Karen Owens and let her know that her echocardiogram has been reviewed.  She was noted to have normal pumping function.  Her diastolic parameters are intermediate.  She was noted to have mild mitral valve regurgitation.  Mild aortic stenosis was also noted.  We will continue with her current medication regimen.  No further testing is needed at this time.  Thank you.

## 2023-09-20 NOTE — Telephone Encounter (Signed)
  Karen CHRISTELLA Beauvais, NP 09/17/2023 10:08 AM EST     Please contact [name] and let her know that her echocardiogram has been reviewed.  She was noted to have normal pumping function.  Her diastolic parameters are intermediate.  She was noted to have mild mitral valve regurgitation.  Mild aortic stenosis was also noted.  We will continue with her current medication regimen.  No further testing is needed at this time.  Thank you.    Mvr, the valve between the left heart chambers doesn't close fully. Blood leaks backward across the valve.  Mild aortic stenosis- Aortic valve stenosis is a thickening and narrowing of the valve between the heart's main pumping chamber and the body's main artery, called the aorta  Went over results with daughter (dpr)- she verbalized understanding and will notify us  if her symptoms worsen.

## 2023-09-20 NOTE — Telephone Encounter (Signed)
Patient's daughter is returning call and is requesting call back.

## 2024-01-02 DIAGNOSIS — H35351 Cystoid macular degeneration, right eye: Secondary | ICD-10-CM | POA: Diagnosis not present

## 2024-01-02 DIAGNOSIS — H43821 Vitreomacular adhesion, right eye: Secondary | ICD-10-CM | POA: Diagnosis not present

## 2024-01-23 ENCOUNTER — Other Ambulatory Visit: Payer: Self-pay

## 2024-01-23 ENCOUNTER — Observation Stay (HOSPITAL_COMMUNITY)
Admission: EM | Admit: 2024-01-23 | Discharge: 2024-01-24 | Disposition: A | Discharge status: Home / Self Care | Attending: Internal Medicine | Admitting: Internal Medicine

## 2024-01-23 ENCOUNTER — Encounter (HOSPITAL_COMMUNITY): Payer: Self-pay

## 2024-01-23 ENCOUNTER — Emergency Department (HOSPITAL_COMMUNITY)

## 2024-01-23 DIAGNOSIS — I517 Cardiomegaly: Secondary | ICD-10-CM | POA: Diagnosis not present

## 2024-01-23 DIAGNOSIS — I10 Essential (primary) hypertension: Secondary | ICD-10-CM | POA: Diagnosis not present

## 2024-01-23 DIAGNOSIS — I129 Hypertensive chronic kidney disease with stage 1 through stage 4 chronic kidney disease, or unspecified chronic kidney disease: Secondary | ICD-10-CM | POA: Insufficient documentation

## 2024-01-23 DIAGNOSIS — J45909 Unspecified asthma, uncomplicated: Secondary | ICD-10-CM | POA: Insufficient documentation

## 2024-01-23 DIAGNOSIS — Z86711 Personal history of pulmonary embolism: Secondary | ICD-10-CM | POA: Diagnosis not present

## 2024-01-23 DIAGNOSIS — R7989 Other specified abnormal findings of blood chemistry: Secondary | ICD-10-CM

## 2024-01-23 DIAGNOSIS — D649 Anemia, unspecified: Secondary | ICD-10-CM

## 2024-01-23 DIAGNOSIS — I700 Atherosclerosis of aorta: Secondary | ICD-10-CM | POA: Diagnosis not present

## 2024-01-23 DIAGNOSIS — N1831 Chronic kidney disease, stage 3a: Secondary | ICD-10-CM | POA: Insufficient documentation

## 2024-01-23 DIAGNOSIS — R0789 Other chest pain: Secondary | ICD-10-CM | POA: Diagnosis not present

## 2024-01-23 DIAGNOSIS — Z86718 Personal history of other venous thrombosis and embolism: Secondary | ICD-10-CM | POA: Insufficient documentation

## 2024-01-23 DIAGNOSIS — Z7901 Long term (current) use of anticoagulants: Secondary | ICD-10-CM | POA: Insufficient documentation

## 2024-01-23 DIAGNOSIS — R072 Precordial pain: Secondary | ICD-10-CM | POA: Diagnosis not present

## 2024-01-23 DIAGNOSIS — R079 Chest pain, unspecified: Principal | ICD-10-CM | POA: Diagnosis present

## 2024-01-23 DIAGNOSIS — J4520 Mild intermittent asthma, uncomplicated: Secondary | ICD-10-CM | POA: Diagnosis not present

## 2024-01-23 DIAGNOSIS — I350 Nonrheumatic aortic (valve) stenosis: Secondary | ICD-10-CM | POA: Diagnosis not present

## 2024-01-23 DIAGNOSIS — E6609 Other obesity due to excess calories: Secondary | ICD-10-CM | POA: Diagnosis not present

## 2024-01-23 DIAGNOSIS — Z79899 Other long term (current) drug therapy: Secondary | ICD-10-CM | POA: Insufficient documentation

## 2024-01-23 LAB — CBC WITH DIFFERENTIAL/PLATELET
Abs Immature Granulocytes: 0.01 10*3/uL (ref 0.00–0.07)
Basophils Absolute: 0 10*3/uL (ref 0.0–0.1)
Basophils Relative: 1 %
Eosinophils Absolute: 0.1 10*3/uL (ref 0.0–0.5)
Eosinophils Relative: 2 %
HCT: 37.5 % (ref 36.0–46.0)
Hemoglobin: 11.7 g/dL — ABNORMAL LOW (ref 12.0–15.0)
Immature Granulocytes: 0 %
Lymphocytes Relative: 27 %
Lymphs Abs: 1.9 10*3/uL (ref 0.7–4.0)
MCH: 31.5 pg (ref 26.0–34.0)
MCHC: 31.2 g/dL (ref 30.0–36.0)
MCV: 101.1 fL — ABNORMAL HIGH (ref 80.0–100.0)
Monocytes Absolute: 0.6 10*3/uL (ref 0.1–1.0)
Monocytes Relative: 9 %
Neutro Abs: 4.4 10*3/uL (ref 1.7–7.7)
Neutrophils Relative %: 61 %
Platelets: 194 10*3/uL (ref 150–400)
RBC: 3.71 MIL/uL — ABNORMAL LOW (ref 3.87–5.11)
RDW: 13.1 % (ref 11.5–15.5)
WBC: 7.1 10*3/uL (ref 4.0–10.5)
nRBC: 0 % (ref 0.0–0.2)

## 2024-01-23 LAB — BASIC METABOLIC PANEL WITH GFR
Anion gap: 5 (ref 5–15)
BUN: 21 mg/dL (ref 8–23)
CO2: 26 mmol/L (ref 22–32)
Calcium: 10.5 mg/dL — ABNORMAL HIGH (ref 8.9–10.3)
Chloride: 108 mmol/L (ref 98–111)
Creatinine, Ser: 1.04 mg/dL — ABNORMAL HIGH (ref 0.44–1.00)
GFR, Estimated: 54 mL/min — ABNORMAL LOW (ref >60)
Glucose, Bld: 94 mg/dL (ref 70–99)
Potassium: 3.9 mmol/L (ref 3.5–5.1)
Sodium: 139 mmol/L (ref 135–145)

## 2024-01-23 LAB — TROPONIN I (HIGH SENSITIVITY)
Troponin I (High Sensitivity): 40 ng/L — ABNORMAL HIGH (ref <18)
Troponin I (High Sensitivity): 38 ng/L — ABNORMAL HIGH (ref <18)

## 2024-01-23 MED ORDER — APIXABAN 5 MG PO TABS
5.0000 mg | ORAL_TABLET | Freq: Two times a day (BID) | ORAL | Status: DC
  Administered 2024-01-23 – 2024-01-24 (×2): 5 mg via ORAL
  Filled 2024-01-23 (×2): qty 1

## 2024-01-23 MED ORDER — HYDROCHLOROTHIAZIDE 12.5 MG PO TABS
12.5000 mg | ORAL_TABLET | Freq: Every day | ORAL | Status: DC
  Administered 2024-01-24: 12.5 mg via ORAL
  Filled 2024-01-23: qty 1

## 2024-01-23 MED ORDER — ONDANSETRON HCL 4 MG/2ML IJ SOLN: 4.0000 mg | Freq: Four times a day (QID) | INTRAMUSCULAR | Status: DC | PRN

## 2024-01-23 MED ORDER — NITROGLYCERIN 0.4 MG SL SUBL: 0.4000 mg | SUBLINGUAL_TABLET | SUBLINGUAL | Status: DC | PRN

## 2024-01-23 MED ORDER — ACETAMINOPHEN 325 MG PO TABS: 650.0000 mg | ORAL_TABLET | ORAL | Status: DC | PRN

## 2024-01-23 NOTE — H&P (Addendum)
 History and Physical    Patient: Karen Owens ZHY:[id number] DOB: [birth year] DOA: 01/23/2024 DOS: the patient was seen and examined on 01/23/2024 PCP: Victorio Grave, MD  Patient coming from: Home  Chief Complaint:  Chief Complaint  Patient presents with   Chest Pain   HPI: DAHLYA Owens is a 83 y.o. female with medical history significant of HTN, HLD, hx of DVT/PE on eliquis , chronic normocytic anemia, CKD 3A, asthma, anxiety, Jehovah's Witness presenting to the ED with chest pain.   Patient reports that she began experiencing very mild intermittent chest pain this morning that woke her up from sleep around 5AM. States that pain was in her left chest and did not radiate anywhere else. Pain was about a 1-2 in severity and did not worsen even with exertion. Given her history of NSTEMI in the past, she decided to come to the ED for further evaluation. She denies any fevers, chills, nausea, vomiting, palpitations, SHOB, cough, abdominal pain, urinary changes.  She has a history of NSTEMI in 2019. Cardiac catheterization at that time revealed essentially normal coronary arteries and was recommended for medical management. She was noted to have a DVT and PE at that time with thought that NSTEMI was due to demand ischemia resulting from PE.  ED course: Vital signs stable aside from elevated BP. CBC with hgb 11.7 (around baseline), otherwise unremarkable.  BMP with mild hypercalcemia, creatinine 1.04 (at her baseline). Troponins 40 > 38. EKG with sinus rhythm and LVH. CXR with cardiomegaly but otherwise stable. Cardiology, Dr. Teofilo Fellers, consulted by ED provider, recommended observation stay with likely cardiac stress testing tomorrow. TRH asked to evaluate patient for admission.  Review of Systems: As mentioned in the history of present illness. All other systems reviewed and are negative. Past Medical History:  Diagnosis Date   Arthritis    Asthma    Complication of anesthesia    difficulty  waking up   DVT (deep venous thrombosis) (HCC)    Refusal of blood transfusions as patient is Jehovah's Witness    Thyroid  nodule    Past Surgical History:  Procedure Laterality Date   DILATION AND CURETTAGE OF UTERUS     LEFT HEART CATH AND CORONARY ANGIOGRAPHY N/A 06/09/2018   Procedure: LEFT HEART CATH AND CORONARY ANGIOGRAPHY;  Surgeon: Avanell Leigh, MD;  Location: [facility];  Service: Cardiovascular;  Laterality: N/A;   LIPOMA EXCISION     right arm   TOTAL KNEE ARTHROPLASTY     left   TOTAL KNEE ARTHROPLASTY  08/20/2012   right knee   TOTAL KNEE ARTHROPLASTY  08/20/2012   Procedure: TOTAL KNEE ARTHROPLASTY;  Surgeon: Ferd Householder, MD;  Location: [facility] OR;  Service: Orthopedics;  Laterality: Right;   Social History:  reports that she quit smoking about 41 years ago. Her smoking use included cigarettes. She has never used smokeless tobacco. She reports current alcohol  use. She reports that she does not use drugs.  Allergies  Allergen Reactions   Tuberculin Tests Hives   Other Other (See Comments)    Jehovah's Witness- NO BLOOD PRODUCTS!!    Family History  Problem Relation Age of Onset   Heart attack Brother     Prior to Admission medications   Medication Sig Start Date End Date Taking? Authorizing Provider  apixaban  (ELIQUIS ) 5 MG TABS tablet Take 1 tablet (5 mg total) by mouth 2 (two) times daily. 07/22/19   Leona Rake, MD  APPLE CIDER VINEGAR PO Take 250 mg  by mouth daily.    [provider]  B Complex CAPS Take 1 capsule by mouth daily. 07/31/23   Carie Charity, NP  carboxymethylcellulose (REFRESH TEARS) 0.5 % SOLN Place 1 drop into both eyes 2 (two) times daily.    [provider]  Cholecalciferol (VITAMIN D) 125 MCG (5000 UT) CAPS Take 1 capsule by mouth daily.    [provider]  Coenzyme Q10 (CO Q 10 PO) Take 1 tablet by mouth daily.    [provider]  hydrochlorothiazide  (MICROZIDE ) 12.5 MG capsule Take  12.5 mg by mouth every other day.    [provider]  ibuprofen (ADVIL,MOTRIN) 800 MG tablet Take 800 mg by mouth every 8 (eight) hours as needed (inflammation/pain). 05/05/18   [provider]  ipratropium (ATROVENT) 0.03 % nasal spray Place into both nostrils. 05/15/22   [provider]  meclizine  (ANTIVERT ) 12.5 MG tablet Take 1 tablet (12.5 mg total) by mouth every 8 (eight) hours as needed. 04/05/23   Harlow Lighter, [place]  N, FNP  nitroGLYCERIN  (NITROSTAT ) 0.4 MG SL tablet Place 1 tablet (0.4 mg total) under the tongue every 5 (five) minutes x 3 doses as needed. 07/09/18   Avanell Leigh, MD  Nutritional Supplements (IMMUNOCAL PO) Take 1 packet by mouth daily. Mix in juice and drink    [provider]  Vitamin D, Ergocalciferol, (DRISDOL) 1.25 MG (50000 UNIT) CAPS capsule Take 50,000 Units by mouth every 7 (seven) days.    [provider]    Physical Exam: Vitals:   01/23/24 1814 01/23/24 1920 01/23/24 1924 01/23/24 2141  BP:  (!) 170/86  (!) 185/94  Pulse:  71  78  Resp:  19 18 20   Temp: 97.9 F (36.6 C) 98.2 F (36.8 C) 98.2 F (36.8 C) 98.2 F (36.8 C)  TempSrc: Oral   Oral  SpO2:  99%  100%  Weight:      Height:       Physical Exam Constitutional:      Appearance: Normal appearance. She is obese. She is not ill-appearing.  HENT:     Head: Normocephalic and atraumatic.     Mouth/Throat:     Mouth: Mucous membranes are moist.     Pharynx: Oropharynx is clear. No oropharyngeal exudate.  Eyes:     General: No scleral icterus.    Extraocular Movements: Extraocular movements intact.     Conjunctiva/sclera: Conjunctivae normal.     Pupils: Pupils are equal, round, and reactive to light.  Cardiovascular:     Rate and Rhythm: Normal rate and regular rhythm.     Pulses: Normal pulses.     Heart sounds: Normal heart sounds. No murmur heard.    No friction rub. No gallop.  Pulmonary:     Effort: Pulmonary effort is normal. No  respiratory distress.     Breath sounds: Normal breath sounds. No wheezing, rhonchi or rales.  Abdominal:     General: Bowel sounds are normal. There is no distension.     Palpations: Abdomen is soft.     Tenderness: There is no abdominal tenderness. There is no guarding or rebound.  Musculoskeletal:        General: Normal range of motion.     Cervical back: Normal range of motion.     Comments: Trace dependent edema in bilateral LE.  Skin:    General: Skin is warm and dry.  Neurological:     General: No focal deficit present.     Mental  Status: She is alert and oriented to person, place, and time.  Psychiatric:        Mood and Affect: Mood normal.        Behavior: Behavior normal.     Data Reviewed:  There are no new results to review at this time.    Latest Ref Rng & Units 01/23/2024    4:40 PM 07/15/2019    2:09 AM 07/14/2019    2:27 AM  CBC  WBC 4.0 - 10.5 K/uL 7.1  8.6  8.9   Hemoglobin 12.0 - 15.0 g/dL 16.1  09.6  04.5   Hematocrit 36.0 - 46.0 % 37.5  34.6  33.9   Platelets 150 - 400 K/uL 194  183  172       Latest Ref Rng & Units 01/23/2024    4:40 PM 07/13/2019    7:45 AM 07/12/2019    5:21 PM  BMP  Glucose 70 - 99 mg/dL 94  409  811   BUN 8 - 23 mg/dL 21  22  20    Creatinine 0.44 - 1.00 mg/dL 9.14  7.82  9.56   Sodium 135 - 145 mmol/L 139  138  140   Potassium 3.5 - 5.1 mmol/L 3.9  4.5  3.6   Chloride 98 - 111 mmol/L 108  109  109   CO2 22 - 32 mmol/L 26  20  22    Calcium  8.9 - 10.3 mg/dL 21.3  9.8  9.8    Troponins 40 > 38  DG Chest Portable 1 View Result Date: 01/23/2024 CLINICAL DATA:  Chest pain EXAM: PORTABLE CHEST 1 VIEW COMPARISON:  04/18/2020 FINDINGS: Cardiomegaly with aortic atherosclerosis. No consolidation, pleural effusion or pneumothorax. Metallic density over the right apex, possibly an artifact. IMPRESSION: No active disease. Cardiomegaly. Probable metallic artifact over the right apex Electronically Signed   By: Esmeralda Hedge M.D.   On:  01/23/2024 15:50     Assessment and Plan: No notes have been filed under this hospital service. Service: Hospitalist  Chest pain Elevated troponins Hx of NSTEMI Patient presenting with mild intermittent chest pain since earlier today. She is currently chest pain free without any acute interventions. Troponins mildly elevated (40 > 38). EKG is nonischemic. She has a history of NSTEMI in 2019 thought to be due to demand ischemia from PE (diagnosed same admission). Cardiac catheterization in 2019 revealed clean coronary vessels and medical management was recommended. ECHO in 09/2023 with LVEF 55-60%, normal LV function, no RWMA, indeterminate diastolic parameters. Cardiology consulted, will discuss with patient in regards to potential stress testing.  -cardiology consulted, appreciate management -resume home SL nitroglycerin  prn for chest pain -resume home eliquis  -lipid panel tomorrow morning -heart healthy diet -telemetry -place in observation -cardiology to discuss potential stress testing while inpatient  HTN -BP ranging in the 170s/80s -on hydrochlorothiazide  12.5mg  daily at home but has not taken today -resume home antihypertensive and trend BP curve  Hx of DVT/PE -resume home eliquis   Chronic normocytic anemia Jehovah's Witness -hgb 11.7 on admission (around her baseline), no reported bleeding events -trend hgb curve  -f/u iron studies, B12, folate -avoid blood products (Jehovah's Witness)  CKD 3A -baseline Cr ~1-1.05 -on admission, Cr 1.04 -chronic and stable  HLD -not on statin therapy at home -lipid panel tomorrow morning  Asthma -chronic and stable, no wheezing on exam, saturating well on RA -not on any inhalers at home for over 10 years per patient and daughter    Advance Care Planning:  Code Status: Full Code   Consults: cardiology  Family Communication: updated daughter at bedside  Severity of Illness: The appropriate patient status for this patient  is OBSERVATION. Observation status is judged to be reasonable and necessary in order to provide the required intensity of service to ensure the patient's safety. The patient's presenting symptoms, physical exam findings, and initial radiographic and laboratory data in the context of their medical condition is felt to place them at decreased risk for further clinical deterioration. Furthermore, it is anticipated that the patient will be medically stable for discharge from the hospital within 2 midnights of admission.   Portions of this note were generated with Scientist, clinical (histocompatibility and immunogenetics). Dictation errors may occur despite best attempts at proofreading.  Author: Oceane Fosse H Leeana Creer, MD 01/23/2024 9:45 PM  For on call review [email or web address].

## 2024-01-23 NOTE — Hospital Course (Signed)
 Chest pain Hx of NSTEMI -trending trops -stress test  -clean cath a few years ago  Trops 40 > 39  [place]

## 2024-01-23 NOTE — ED Triage Notes (Signed)
 Pt complaining of dull pressure in her chest that started this morning. Pt states that it comes and goes. Pt has history of heart attack 3 years ago.

## 2024-01-23 NOTE — ED Provider Notes (Signed)
 [place] EMERGENCY DEPARTMENT AT [facility] Provider Note   CSN: [id number] Arrival date & time: 01/23/24  1410     History  Chief Complaint  Patient presents with   Chest Pain    Karen Owens is a 83 y.o. female.  83 year old female with past medical history of DVT on Eliquis  as well as NSTEMI in the past presenting to the emergency department today with chest pressure.  The patient states that she has had very mild chest pressure throughout the day today.  She reports that this waxes and wanes in intensity.  She states that is very mild about 2 on or 3 out of the pain scale at its maximum.  She states that the chest discomfort has resolved this point.  It is not exertional.  Nonpleuritic.  She denies any leg pain or swelling.  Denies any hemoptysis.  She came to the ER today for further evaluation regarding this.  She has been taking her Eliquis  as prescribed.   Chest Pain      Home Medications Prior to Admission medications   Medication Sig Start Date End Date Taking? Authorizing Provider  apixaban  (ELIQUIS ) 5 MG TABS tablet Take 1 tablet (5 mg total) by mouth 2 (two) times daily. 07/22/19   Leona Rake, MD  APPLE CIDER VINEGAR PO Take 250 mg by mouth daily.    [provider]  B Complex CAPS Take 1 capsule by mouth daily. 07/31/23   Carie Charity, NP  carboxymethylcellulose (REFRESH TEARS) 0.5 % SOLN Place 1 drop into both eyes 2 (two) times daily.    [provider]  Cholecalciferol (VITAMIN D) 125 MCG (5000 UT) CAPS Take 1 capsule by mouth daily.    [provider]  Coenzyme Q10 (CO Q 10 PO) Take 1 tablet by mouth daily.    [provider]  hydrochlorothiazide  (MICROZIDE ) 12.5 MG capsule Take 12.5 mg by mouth every other day.    [provider]  ibuprofen (ADVIL,MOTRIN) 800 MG tablet Take 800 mg by mouth every 8 (eight) hours as needed (inflammation/pain). 05/05/18   [provider]  ipratropium  (ATROVENT) 0.03 % nasal spray Place into both nostrils. 05/15/22   [provider]  meclizine  (ANTIVERT ) 12.5 MG tablet Take 1 tablet (12.5 mg total) by mouth every 8 (eight) hours as needed. 04/05/23   Harlow Lighter, [place]  N, FNP  nitroGLYCERIN  (NITROSTAT ) 0.4 MG SL tablet Place 1 tablet (0.4 mg total) under the tongue every 5 (five) minutes x 3 doses as needed. 07/09/18   Avanell Leigh, MD  Nutritional Supplements (IMMUNOCAL PO) Take 1 packet by mouth daily. Mix in juice and drink    [provider]  Vitamin D, Ergocalciferol, (DRISDOL) 1.25 MG (50000 UNIT) CAPS capsule Take 50,000 Units by mouth every 7 (seven) days.    [provider]      Allergies    Tuberculin tests and Other    Review of Systems   Review of Systems  Cardiovascular:  Positive for chest pain.  All other systems reviewed and are negative.   Physical Exam Updated Vital Signs BP (!) 170/86   Pulse 71   Temp 98.2 F (36.8 C)   Resp 18   Ht 5\' 6"  (1.676 m)   Wt 93 kg   SpO2 99%   BMI 33.09 kg/m  Physical Exam Vitals and nursing note reviewed.   Gen: NAD Eyes: PERRL, EOMI HEENT: no oropharyngeal swelling Neck: trachea midline Resp: clear to auscultation  bilaterally Card: RRR, no murmurs, rubs, or gallops Abd: nontender, nondistended Extremities: no calf tenderness, no edema Vascular: 2+ radial pulses bilaterally, 2+ DP pulses bilaterally Skin: no rashes Psyc: acting appropriately   ED Results / Procedures / Treatments   Labs (all labs ordered are listed, but only abnormal results are displayed) Labs Reviewed  CBC WITH DIFFERENTIAL/PLATELET - Abnormal; Notable for the following components:      Result Value   RBC 3.71 (*)    Hemoglobin 11.7 (*)    MCV 101.1 (*)    All other components within normal limits  BASIC METABOLIC PANEL WITH GFR - Abnormal; Notable for the following components:   Creatinine, Ser 1.04 (*)    Calcium  10.5 (*)    GFR, Estimated 54 (*)    All other  components within normal limits  TROPONIN I (HIGH SENSITIVITY) - Abnormal; Notable for the following components:   Troponin I (High Sensitivity) 40 (*)    All other components within normal limits  TROPONIN I (HIGH SENSITIVITY) - Abnormal; Notable for the following components:   Troponin I (High Sensitivity) 38 (*)    All other components within normal limits    EKG None  Radiology DG Chest Portable 1 View Result Date: 01/23/2024 CLINICAL DATA:  Chest pain EXAM: PORTABLE CHEST 1 VIEW COMPARISON:  04/18/2020 FINDINGS: Cardiomegaly with aortic atherosclerosis. No consolidation, pleural effusion or pneumothorax. Metallic density over the right apex, possibly an artifact. IMPRESSION: No active disease. Cardiomegaly. Probable metallic artifact over the right apex Electronically Signed   By: Esmeralda Hedge M.D.   On: 01/23/2024 15:50    Procedures Procedures    Medications Ordered in ED Medications - No data to display  ED Course/ Medical Decision Making/ A&P                                 Medical Decision Making 83 year old female with past medical history of coronary artery disease and DVT on Eliquis  presenting to the emergency department today with chest pressure.  I will further evaluate patient here with basic labs Wels and EKG, chest x-ray, and troponin for further evaluation for ACS, pulmonary edema, pulmonary infiltrates, or pneumothorax.  Her pain is nonpleuritic.  Her vital signs are reassuring.  Based on the description of her symptoms suspicion for PE is low at this time as her heart rate is within normal limits, she denies any shortness of breath, her pulse ox is 100% on room air.  If her troponin is elevated will consider further evaluation regarding this but at this time I do not think this warranted emergently.  The patient's troponins were mildly elevated here.  EKG does not have any concerning findings.  I did call and discussed this with Dr. Teofilo Fellers.  He does recommend  admission overnight for trending the patient's troponins and for a stress test tomorrow.  A call was placed to the hospitalist service for admission.  Amount and/or Complexity of Data Reviewed Radiology: ordered.  Risk Decision regarding hospitalization.           Final Clinical Impression(s) / ED Diagnoses Final diagnoses:  Chest pain, unspecified type    Rx / DC Orders ED Discharge Orders     None         Carin Charleston, MD 01/23/24 2025

## 2024-01-23 NOTE — Consult Note (Signed)
 Referring Physician: Ellan Gunner is an 83 y.o. female.                       Chief Complaint: Chest pain  HPI: 83 years old black female with PMH of Asthma, DVT, Arthritis and obesity had chest pain after moving heavy objects. She denies sweating spell, shortness of breath or radiation of chest pain. Chest pain is reproducible. EKG shows sinus rhythm with LVH. Troponin I is normal x 2. Cardiac cath in 05/2018 showed normal coronaries. Echocardiogram in 09/2023 showed normal LV systolic function with mild AS.  Past Medical History:  Diagnosis Date   Arthritis    Asthma    Complication of anesthesia    difficulty waking up   DVT (deep venous thrombosis) (HCC)    Refusal of blood transfusions as patient is Jehovah's Witness    Thyroid  nodule       Past Surgical History:  Procedure Laterality Date   DILATION AND CURETTAGE OF UTERUS     LEFT HEART CATH AND CORONARY ANGIOGRAPHY N/A 06/09/2018   Procedure: LEFT HEART CATH AND CORONARY ANGIOGRAPHY;  Surgeon: Avanell Leigh, MD;  Location: [facility];  Service: Cardiovascular;  Laterality: N/A;   LIPOMA EXCISION     right arm   TOTAL KNEE ARTHROPLASTY     left   TOTAL KNEE ARTHROPLASTY  08/20/2012   right knee   TOTAL KNEE ARTHROPLASTY  08/20/2012   Procedure: TOTAL KNEE ARTHROPLASTY;  Surgeon: Ferd Householder, MD;  Location: [facility] OR;  Service: Orthopedics;  Laterality: Right;    Family History  Problem Relation Age of Onset   Heart attack Brother    Social History:  reports that she quit smoking about 41 years ago. Her smoking use included cigarettes. She has never used smokeless tobacco. She reports current alcohol  use. She reports that she does not use drugs.  Allergies:  Allergies  Allergen Reactions   Tuberculin Tests Hives   Other Other (See Comments)    Jehovah's Witness- NO BLOOD PRODUCTS!!    Medications Prior to Admission  Medication Sig Dispense Refill   apixaban  (ELIQUIS ) 5 MG TABS  tablet Take 1 tablet (5 mg total) by mouth 2 (two) times daily. 60 tablet 0   Bromfenac Sodium 0.09 % SOLN Place 1 drop into the right eye daily.     APPLE CIDER VINEGAR PO Take 250 mg by mouth daily.     B Complex CAPS Take 1 capsule by mouth daily.     carboxymethylcellulose (REFRESH TEARS) 0.5 % SOLN Place 1 drop into both eyes 2 (two) times daily.     Cholecalciferol (VITAMIN D) 125 MCG (5000 UT) CAPS Take 1 capsule by mouth daily.     Coenzyme Q10 (CO Q 10 PO) Take 1 tablet by mouth daily.     hydrochlorothiazide  (MICROZIDE ) 12.5 MG capsule Take 12.5 mg by mouth every other day.     ibuprofen (ADVIL,MOTRIN) 800 MG tablet Take 800 mg by mouth every 8 (eight) hours as needed (inflammation/pain).  0   ipratropium (ATROVENT) 0.03 % nasal spray Place into both nostrils.     meclizine  (ANTIVERT ) 12.5 MG tablet Take 1 tablet (12.5 mg total) by mouth every 8 (eight) hours as needed. 30 tablet 0   nitroGLYCERIN  (NITROSTAT ) 0.4 MG SL tablet Place 1 tablet (0.4 mg total) under the tongue every 5 (five) minutes x 3 doses as needed. 25 tablet 3   Nutritional Supplements (  IMMUNOCAL PO) Take 1 packet by mouth daily. Mix in juice and drink     Vitamin D, Ergocalciferol, (DRISDOL) 1.25 MG (50000 UNIT) CAPS capsule Take 50,000 Units by mouth every 7 (seven) days.      Results for orders placed or performed during the hospital encounter of 01/23/24 (from the past 48 hours)  CBC with Differential     Status: Abnormal   Collection Time: 01/23/24  4:40 PM  Result Value Ref Range   WBC 7.1 4.0 - 10.5 K/uL   RBC 3.71 (L) 3.87 - 5.11 MIL/uL   Hemoglobin 11.7 (L) 12.0 - 15.0 g/dL   HCT 16.1 09.6 - 04.5 %   MCV 101.1 (H) 80.0 - 100.0 fL   MCH 31.5 26.0 - 34.0 pg   MCHC 31.2 30.0 - 36.0 g/dL   RDW 40.9 81.1 - 91.4 %   Platelets 194 150 - 400 K/uL   nRBC 0.0 0.0 - 0.2 %   Neutrophils Relative % 61 %   Neutro Abs 4.4 1.7 - 7.7 K/uL   Lymphocytes Relative 27 %   Lymphs Abs 1.9 0.7 - 4.0 K/uL   Monocytes  Relative 9 %   Monocytes Absolute 0.6 0.1 - 1.0 K/uL   Eosinophils Relative 2 %   Eosinophils Absolute 0.1 0.0 - 0.5 K/uL   Basophils Relative 1 %   Basophils Absolute 0.0 0.0 - 0.1 K/uL   Immature Granulocytes 0 %   Abs Immature Granulocytes 0.01 0.00 - 0.07 K/uL    Comment: Performed at [facility], [address], [place], [place] [address]  Basic metabolic panel     Status: Abnormal   Collection Time: 01/23/24  4:40 PM  Result Value Ref Range   Sodium 139 135 - 145 mmol/L   Potassium 3.9 3.5 - 5.1 mmol/L   Chloride 108 98 - 111 mmol/L   CO2 26 22 - 32 mmol/L   Glucose, Bld 94 70 - 99 mg/dL    Comment: Glucose reference range applies only to samples taken after fasting for at least 8 hours.   BUN 21 8 - 23 mg/dL   Creatinine, Ser 6.21 (H) 0.44 - 1.00 mg/dL   Calcium  10.5 (H) 8.9 - 10.3 mg/dL   GFR, Estimated 54 (L) >60 mL/min    Comment: (NOTE) Calculated using the CKD-EPI Creatinine Equation (2021)    Anion gap 5 5 - 15    Comment: Performed at [facility], [address], [place], [place] [address]  Troponin I (High Sensitivity)     Status: Abnormal   Collection Time: 01/23/24  4:40 PM  Result Value Ref Range   Troponin I (High Sensitivity) 40 (H) <18 ng/L    Comment: (NOTE) Elevated high sensitivity troponin I (hsTnI) values and significant  changes across serial measurements may suggest ACS but many other  chronic and acute conditions are known to elevate hsTnI results.  Refer to the "Links" section for chest pain algorithms and additional  guidance. Performed at [facility], [address] [place], [place] [address]   Troponin I (High Sensitivity)     Status: Abnormal   Collection Time: 01/23/24  6:40 PM  Result Value Ref Range   Troponin I (High Sensitivity) 38 (H) <18 ng/L    Comment: (NOTE) Elevated high sensitivity troponin I (hsTnI) values and significant  changes across serial measurements may  suggest ACS but many other  chronic and acute conditions are known to elevate hsTnI results.  Refer to the "Links" section for chest pain algorithms and additional  guidance. Performed at [facility], [address] [place], [place] [address]    DG Chest Portable 1 View Result Date: 01/23/2024 CLINICAL DATA:  Chest pain EXAM: PORTABLE CHEST 1 VIEW COMPARISON:  04/18/2020 FINDINGS: Cardiomegaly with aortic atherosclerosis. No consolidation, pleural effusion or pneumothorax. Metallic density over the right apex, possibly an artifact. IMPRESSION: No active disease. Cardiomegaly. Probable metallic artifact over the right apex Electronically Signed   By: Esmeralda Hedge M.D.   On: 01/23/2024 15:50    Review Of Systems Constitutional: No fever, chills, weight loss or gain. Eyes: No vision change, wears glasses. No discharge or pain. Ears: No hearing loss, No tinnitus. Respiratory: No asthma, COPD, pneumonias. No shortness of breath. No hemoptysis. Cardiovascular: Positive chest pain, no palpitation, no leg edema. Gastrointestinal: No nausea, vomiting, diarrhea, constipation. No GI bleed. No hepatitis. Genitourinary: No dysuria, hematuria, kidney stone. No incontinance. Neurological: No headache, stroke, seizures.  Psychiatry: No psych facility admission for anxiety, depression, suicide. No detox. Skin: No rash. Musculoskeletal: Positive joint pain, no fibromyalgia. No neck pain, back pain. Lymphadenopathy: No lymphadenopathy. Hematology: No anemia or easy bruising.   Blood pressure (!) 185/94, pulse 78, temperature 98.2 F (36.8 C), temperature source Oral, resp. rate 20, height 5\' 6"  (1.676 m), weight 93 kg, SpO2 100%. Body mass index is 33.09 kg/m. General appearance: alert, cooperative, appears stated age and no distress Head: Normocephalic, atraumatic. Eyes: Brown eyes, pink conjunctiva, corneas clear. PERRL, EOM's intact. Neck: No adenopathy, no carotid bruit, no  JVD, supple, symmetrical, trachea midline and thyroid  not enlarged. Resp: Clear to auscultation bilaterally. Mild wheezing on forced expiration. Chest wall tender on palpation over left precordium. Cardio: Regular rate and rhythm, S1, S2 normal, III/VI systolic murmur, no click, rub or gallop GI: Soft, non-tender; bowel sounds normal; no organomegaly. Extremities: No edema, cyanosis or clubbing. Skin: Warm and dry.  Neurologic: Alert and oriented X 3, normal strength. Normal coordination.  Assessment/Plan Chest pain, musculoskeletal Mild Aortic stenosis Arthritis Obesity Asthma  Plan: May discharge from Cardiology stand point with OP follow up by primary cardiology. Patient advised to avoid NTG use as she has normal coronaries and aortic stenosis may cause syncope. She will have periodic evaluation of her aortic valve stenosis and may consider valve replacement when symptomatic like chest pain, CHF or syncope or reduced functional capacity. Patient and daughter accept medical therapy for now without additional investigation. Discussed non-surgical AV replacement in future when time is appropriate for procedure.  Time spent: Review of old records, Lab, x-rays, EKG, other cardiac tests, examination, discussion with patient/Doctor/Family over 70 minutes.  Darrold Emms, MD  01/23/2024, 10:30 PM

## 2024-01-24 DIAGNOSIS — R0789 Other chest pain: Secondary | ICD-10-CM | POA: Diagnosis not present

## 2024-01-24 LAB — LIPID PANEL
Cholesterol: 207 mg/dL — ABNORMAL HIGH (ref 0–200)
HDL: 76 mg/dL (ref >40)
LDL Cholesterol: 123 mg/dL — ABNORMAL HIGH (ref 0–99)
Total CHOL/HDL Ratio: 2.7 ratio
Triglycerides: 39 mg/dL (ref <150)
VLDL: 8 mg/dL (ref 0–40)

## 2024-01-24 LAB — IRON AND TIBC
Iron: 64 ug/dL (ref 28–170)
Saturation Ratios: 23 % (ref 10.4–31.8)
TIBC: 276 ug/dL (ref 250–450)
UIBC: 212 ug/dL

## 2024-01-24 LAB — VITAMIN B12: Vitamin B-12: 957 pg/mL — ABNORMAL HIGH (ref 180–914)

## 2024-01-24 LAB — FERRITIN: Ferritin: 111 ng/mL (ref 11–307)

## 2024-01-24 LAB — FOLATE: Folate: 9.6 ng/mL (ref >5.9)

## 2024-01-24 NOTE — Progress Notes (Addendum)
 AVS reviewed w/ pt who verbalized an understanding. No other questions at this time. Central tele notified of patient d/c  to home & removal of tele box. PIV removed as noted. Pt finishing lunch. Pt dressing for d/c to home.Home w/ daughter

## 2024-01-24 NOTE — Discharge Summary (Signed)
 Physician Discharge Summary  Karen Owens:[id number] DOB: [birth year] DOA: 01/23/2024  PCP: Victorio Grave, MD  Admit date: 01/23/2024 Discharge date: 01/24/2024  Admitted From: Home Disposition: Home  Recommendations for Outpatient Follow-up:  Follow up with PCP in 1-2 weeks Follow-up with cardiology as scheduled:  Home Health: None Equipment/Devices: None  Discharge Condition: Stable CODE STATUS: Full Diet recommendation: Low-salt low-fat diet  Brief/Interim Summary: Karen Owens is a 83 y.o. female with medical history significant of HTN, HLD, hx of DVT/PE on eliquis , chronic normocytic anemia, CKD 3A, asthma, anxiety, Jehovah's Witness presenting to the ED with chest pain.  Patient was evaluated by cardiology overnight, discussed with team again this morning, given her negative troponin negative EKG with reassuring symptoms and resolution of chest pain we will plan for outpatient follow-up with primary cardiology team.  It appears she has been somewhat noncompliant with her antihypertensives which likely exacerbated her symptoms along with uncontrolled blood pressure/hypertension.  Lengthy discussion with patient and daughter at bedside about need for ongoing medication adherence.  Otherwise stable and agreeable for discharge home.  Discharge Diagnoses:  Principal Problem:   Chest pain  Discharge Instructions  Discharge Instructions     Diet - low sodium heart healthy   Complete by: As directed    Discharge instructions   Complete by: As directed    Follow up with cardiology as discussed - continue to take all medications as directed. Take blood pressure readings every morning after waking up as instructed. Keep note of readings for cardiology office visit.   Increase activity slowly   Complete by: As directed       Allergies as of 01/24/2024       Reactions   Tuberculin Tests Hives   Other Other (See Comments)   Jehovah's Witness- NO BLOOD PRODUCTS!!         Medication List     TAKE these medications    apixaban  5 MG Tabs tablet Commonly known as: ELIQUIS  Take 1 tablet (5 mg total) by mouth 2 (two) times daily.   B Complex Caps Take 1 capsule by mouth daily.   Bromfenac Sodium 0.09 % Soln Place 1 drop into the right eye every other day.   CO Q 10 PO Take 1 tablet by mouth daily.   hydrochlorothiazide  12.5 MG tablet Commonly known as: HYDRODIURIL  Take 12.5 mg by mouth daily.   nitroGLYCERIN  0.4 MG SL tablet Commonly known as: NITROSTAT  Place 1 tablet (0.4 mg total) under the tongue every 5 (five) minutes x 3 doses as needed.   Refresh Tears 0.5 % Soln Generic drug: carboxymethylcellulose Place 1 drop into both eyes daily as needed (dry eye).   Vitamin D 125 MCG (5000 UT) Caps Take 1 capsule by mouth daily.        Follow-up Information     Eduard Grad Chet Cota, NP Follow up.   Specialty: Cardiology Why: As needed, as arranged Contact information: [address] [place] [address] [phone]                Allergies  Allergen Reactions   Tuberculin Tests Hives   Other Other (See Comments)    Jehovah's Witness- NO BLOOD PRODUCTS!!    Consultations: Cardiology  Procedures/Studies: DG Chest Portable 1 View Result Date: 01/23/2024 CLINICAL DATA:  Chest pain EXAM: PORTABLE CHEST 1 VIEW COMPARISON:  04/18/2020 FINDINGS: Cardiomegaly with aortic atherosclerosis. No consolidation, pleural effusion or pneumothorax. Metallic density over the right apex, possibly an artifact. IMPRESSION: No active disease.  Cardiomegaly. Probable metallic artifact over the right apex Electronically Signed   By: Esmeralda Hedge M.D.   On: 01/23/2024 15:50     Subjective: No acute issues or events overnight, chest pain resolved otherwise denies nausea vomit diarrhea constipation any fevers chills shortness of breath   Discharge Exam: Vitals:   01/24/24 0224 01/24/24 0456  BP: (!) 145/68 (!) 150/80  Pulse: (!) 56 67   Resp: 18 18  Temp: 98.1 F (36.7 C) 98.2 F (36.8 C)  SpO2: 92% 100%   Vitals:   01/23/24 1924 01/23/24 2141 01/24/24 0224 01/24/24 0456  BP:  (!) 185/94 (!) 145/68 (!) 150/80  Pulse:  78 (!) 56 67  Resp: 18 20 18 18   Temp: 98.2 F (36.8 C) 98.2 F (36.8 C) 98.1 F (36.7 C) 98.2 F (36.8 C)  TempSrc:  Oral    SpO2:  100% 92% 100%  Weight:      Height:        General: Pt is alert, awake, not in acute distress Cardiovascular: RRR, S1/S2 +, no rubs, no gallops Respiratory: CTA bilaterally, no wheezing, no rhonchi Abdominal: Soft, NT, ND, bowel sounds + Extremities: no edema, no cyanosis    The results of significant diagnostics from this hospitalization (including imaging, microbiology, ancillary and laboratory) are listed below for reference.     Microbiology: No results found for this or any previous visit (from the past 240 hours).   Labs: BNP (last 3 results) No results for input(s): "BNP" in the last 8760 hours. Basic Metabolic Panel: Recent Labs  Lab 01/23/24 1640  NA 139  K 3.9  CL 108  CO2 26  GLUCOSE 94  BUN 21  CREATININE 1.04*  CALCIUM  10.5*   Liver Function Tests: No results for input(s): "AST", "ALT", "ALKPHOS", "BILITOT", "PROT", "ALBUMIN " in the last 168 hours. No results for input(s): "LIPASE", "AMYLASE" in the last 168 hours. No results for input(s): "AMMONIA" in the last 168 hours. CBC: Recent Labs  Lab 01/23/24 1640  WBC 7.1  NEUTROABS 4.4  HGB 11.7*  HCT 37.5  MCV 101.1*  PLT 194   Cardiac Enzymes: No results for input(s): "CKTOTAL", "CKMB", "CKMBINDEX", "TROPONINI" in the last 168 hours. BNP: Invalid input(s): "POCBNP" CBG: No results for input(s): "GLUCAP" in the last 168 hours. D-Dimer No results for input(s): "DDIMER" in the last 72 hours. Hgb A1c No results for input(s): "HGBA1C" in the last 72 hours. Lipid Profile Recent Labs    01/24/24 0521  CHOL 207*  HDL 76  LDLCALC 123*  TRIG 39  CHOLHDL 2.7   Thyroid   function studies No results for input(s): "TSH", "T4TOTAL", "T3FREE", "THYROIDAB" in the last 72 hours.  Invalid input(s): "FREET3" Anemia work up Recent Labs    01/23/24 2302  VITAMINB12 957*  FOLATE 9.6  FERRITIN 111  TIBC 276  IRON 64   Urinalysis    Component Value Date/Time   COLORURINE YELLOW 08/15/2012 1140   APPEARANCEUR HAZY (A) 08/15/2012 1140   LABSPEC 1.029 08/15/2012 1140   PHURINE 5.0 08/15/2012 1140   GLUCOSEU NEGATIVE 08/15/2012 1140   HGBUR NEGATIVE 08/15/2012 1140   BILIRUBINUR NEGATIVE 08/15/2012 1140   KETONESUR NEGATIVE 08/15/2012 1140   PROTEINUR NEGATIVE 08/15/2012 1140   UROBILINOGEN 0.2 08/15/2012 1140   NITRITE NEGATIVE 08/15/2012 1140   LEUKOCYTESUR SMALL (A) 08/15/2012 1140   Sepsis Labs Recent Labs  Lab 01/23/24 1640  WBC 7.1   Microbiology No results found for this or any previous visit (from the past  240 hours).   Time coordinating discharge: Over 30 minutes  SIGNED:   Haydee Lipa, DO [facility] 01/24/2024, 3:17 PM Pager   If 7PM-7AM, please contact night-coverage [email or web address]

## 2024-01-24 NOTE — Progress Notes (Signed)
   01/24/24 1045  TOC Brief Assessment  Insurance and Status Reviewed  Patient has primary care physician Yes  Home environment has been reviewed Single family home  Prior level of function: Independent  Prior/Current Home Services No current home services  Social Drivers of Health Review SDOH reviewed no interventions necessary  Readmission risk has been reviewed Yes  Transition of care needs no transition of care needs at this time

## 2024-01-24 NOTE — Plan of Care (Signed)
  Problem: Education: Goal: Understanding of cardiac disease, CV risk reduction, and recovery process will improve Outcome: Progressing   Problem: Activity: Goal: Ability to tolerate increased activity will improve Outcome: Progressing   Problem: Cardiac: Goal: Ability to achieve and maintain adequate cardiovascular perfusion will improve Outcome: Progressing   Problem: Education: Goal: Knowledge of General Education information will improve Description: Including pain rating scale, medication(s)/side effects and non-pharmacologic comfort measures Outcome: Progressing   Problem: Clinical Measurements: Goal: Diagnostic test results will improve Outcome: Progressing Goal: Cardiovascular complication will be avoided Outcome: Progressing   Problem: Nutrition: Goal: Adequate nutrition will be maintained Outcome: Progressing

## 2024-02-13 ENCOUNTER — Ambulatory Visit: Admitting: Family

## 2024-02-20 NOTE — Progress Notes (Unsigned)
 Cardiology Clinic Note   Patient Name: Karen Owens Date of Encounter: 02/24/2024  Primary Care Provider:  Victorio Grave, MD Primary Cardiologist:  Lauro Portal, MD  Patient Profile    Karen Owens is a 83 year old female presents to the clinic today for follow-up evaluation of her chest pain.  Past Medical History    Past Medical History:  Diagnosis Date   Arthritis    Asthma    Complication of anesthesia    difficulty waking up   DVT (deep venous thrombosis) (HCC)    Refusal of blood transfusions as patient is Jehovah's Witness    Thyroid  nodule    Past Surgical History:  Procedure Laterality Date   DILATION AND CURETTAGE OF UTERUS     LEFT HEART CATH AND CORONARY ANGIOGRAPHY N/A 06/09/2018   Procedure: LEFT HEART CATH AND CORONARY ANGIOGRAPHY;  Surgeon: Avanell Leigh, MD;  Location: [facility];  Service: Cardiovascular;  Laterality: N/A;   LIPOMA EXCISION     right arm   TOTAL KNEE ARTHROPLASTY     left   TOTAL KNEE ARTHROPLASTY  08/20/2012   right knee   TOTAL KNEE ARTHROPLASTY  08/20/2012   Procedure: TOTAL KNEE ARTHROPLASTY;  Surgeon: Ferd Householder, MD;  Location: [facility] OR;  Service: Orthopedics;  Laterality: Right;    Allergies  Allergies  Allergen Reactions   Tuberculin Tests Hives   Other Other (See Comments)    Jehovah's Witness- NO BLOOD PRODUCTS!!    History of Present Illness    Karen Owens has a PMH of DVT, pulmonary embolus, Prinzmetal angina, essential hypertension, asthma, anxiety, hyperlipidemia, NSTEMI, PE, and is a Jehovah's Witness.  She underwent cardiac catheterization which showed no coronary artery disease 06/09/2018.  She was seen by Neomi Banks, NP-C 04/14/2021.  She noted no shortness of breath at rest.  She indicated that her dyspnea on exertion was improving.  She felt that she was overall better.  She denied chest pain, pressure, and tightness.  She had no lower extremity swelling orthopnea or PND.  She denied  palpitations.  She was using a pedal exerciser.  She has 3 sons and 3 daughters and was happy to be a great grandmother.  (Recent birth of first great grandchild)    She contacted the nurse triage line on 02/08/2022.  She reported occasional episodes of chest tightness and shortness of breath.  The episodes started about 1 week prior.  She noted that she has been walking regularly and reported the episodes had dissipated over the last few days however she would still like to be seen.  She presented to the clinic 02/13/22 for evaluation and stated she  had 2 episodes of chest pressure.  The first episode happened after a call that included stressful news about her handicapped son.  Another episode happened after a climbing up a set of stairs.  Both episodes resolved spontaneously after drinking cool water.  We reviewed her cardiac catheterization and previous echocardiogram.  She asked about antianxiety medication (Xanax).  I  gave her the mindfulness stress reduction sheet, salty 6 diet sheet, continued her current medication regimen, and planned follow-up in 4 to 6 months.  I reassured her that her chest pressure did not appear to be related to cardiac issues.  She presented to the clinic 06/12/22 for follow up evaluation and stated she continued to do well from a heart standpoint today.  She continued to notice some increased work of breathing with increased physical  activity such as climbing stairs.  She had normal breathing with normal activities.  She continued to follow a reduced sodium diet.  Her blood pressure was well controlled today.  We reviewed her previous cardiac catheterization and echocardiogram again.  She expressed understanding.  She did notice some funny feeling in her chest 2 weeks ago.  It lasted for few seconds and resolved on its own.  She reported that she was able to drink some cool water and the symptoms went away.  We reviewed possible causes for the discomfort and she was reassured  that it was not related to her heart.  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  We planned follow-up in 6 to 9 months.  She presented to the clinic 10/10/22 for follow-up evaluation and stated she felt well .  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  Reported that when the weather was cooler she prefered to walk in department stores.  She did not like walking in her neighborhood due to the dogs.  She occasionally noticed brief fluttering sensation that lasted for seconds and dissipated on its own or with rest.  We reviewed her cardiac catheterization, recent lab work, and echocardiogram.  She expressed understanding.  We defered repeat echocardiogram at that time and plan for repeat echo to evaluate aortic valve January 2025.  I asked her to increase her physical activity as tolerated and continue her current medication regimen.  Will plan follow-up in 12 months.  She presented to the clinic 01/31/23 for follow-up evaluation and stated she had increased shortness of breath when she made the appointment.  She then started to go to the gym and using a stationary bike.  Her breathing had significantly  improved.  She reported that she had increased stress with the passing of her oldest son about 1 month before.  We reviewed her prior echocardiogram.  She expressed understanding.  We also reviewed her need for continued apixaban  due to her DVT and history of PE.  I planned follow-up in December or January.    She presented to the clinic 08/01/2023 for follow-up evaluation and stated she continued to feel well.  She reported that she had been having less energy.  She was interested in taking a B complex vitamin.  We reviewed her diet.  She continued to eat low-sodium.  She had been not going to the gym as often due to increased  COVID infections.  She continued to use her stationary pedal bike at home.  We reviewed her last clinic visit  and most recent lipid panel.  I  asked her to increase her physical activity as tolerated, and we will plan follow-up in 6 months.  She was seen in the emergency department on 01/23/2024 and discharged on 01/24/2024.  She presented to the emergency department with chest pain.  Cardiology was consulted.  Her cardiac troponins were negative.  Her EKG was reassuring.  Her chest pain resolved.  Outpatient follow-up with cardiology was recommended.  It was felt that she had been somewhat noncompliant with her antihypertensive medication which exacerbated her symptoms.  Medication adherence was discussed.  She presents to the clinic today for evaluation and states she feels well.  She does have episodes where she is woozy occasionally.  She had reduced her HCTZ down to 6.25 mg daily.  Her blood pressure initially in the office today is 136/90 and on recheck it is 136/82.  She continues to be physically  active and is exercising about 3 times per week.  We reviewed her emergency department visit.  She and her daughter expressed understanding.  I will continue her current medication regimen and plan follow-up in 6 months.  Today she denies chest pain, shortness of breath, lower extremity edema, fatigue, palpitations, melena, hematuria, hemoptysis, diaphoresis, weakness, presyncope, syncope, orthopnea, and PND.   Home Medications    Prior to Admission medications   Medication Sig Start Date End Date Taking? Authorizing Provider  apixaban  (ELIQUIS ) 5 MG TABS tablet Take 1 tablet (5 mg total) by mouth 2 (two) times daily. 07/22/19   Leona Rake, MD  APPLE CIDER VINEGAR PO Take 250 mg by mouth daily.    [provider]  carboxymethylcellulose (REFRESH TEARS) 0.5 % SOLN Place 1 drop into both eyes 2 (two) times daily.    [provider]  Coenzyme Q10 (CO Q 10 PO) Take 1 tablet by mouth daily.    [provider]  hydrochlorothiazide  (MICROZIDE ) 12.5 MG capsule Take 12.5 mg by mouth  every other day.    [provider]  ibuprofen (ADVIL,MOTRIN) 800 MG tablet Take 800 mg by mouth every 8 (eight) hours as needed (inflammation/pain).  05/05/18   [provider]  nitroGLYCERIN  (NITROSTAT ) 0.4 MG SL tablet Place 1 tablet (0.4 mg total) under the tongue every 5 (five) minutes x 3 doses as needed. 07/09/18   Avanell Leigh, MD  Nutritional Supplements (IMMUNOCAL PO) Take 1 packet by mouth daily. Mix in juice and drink    [provider]  thiamine (VITAMIN B-1) 100 MG tablet Take 100 mg by mouth daily.    [provider]  Vitamin D, Ergocalciferol, (DRISDOL) 1.25 MG (50000 UNIT) CAPS capsule Take 50,000 Units by mouth every 7 (seven) days.    [provider]    Family History    Family History  Problem Relation Age of Onset   Heart attack Brother    She indicated that her brother is alive.  Social History    Social History   Socioeconomic History   Marital status: Divorced    Spouse name: Not on file   Number of children: Not on file   Years of education: Not on file   Highest education level: Not on file  Occupational History   Not on file  Tobacco Use   Smoking status: Former    Current packs/day: 0.00    Types: Cigarettes    Quit date: 09/10/1982    Years since quitting: 41.4   Smokeless tobacco: Never  Substance and Sexual Activity   Alcohol  use: Yes    Comment: occasional   Drug use: No   Sexual activity: Never    Birth control/protection: Post-menopausal  Other Topics Concern   Not on file  Social History Narrative   Not on file   Social Drivers of Health   Financial Resource Strain: Not on file  Food Insecurity: No Food Insecurity (01/23/2024)   Hunger Vital Sign    Worried About Running Out of Food in the Last Year: Never true    Ran Out of Food in the Last Year: Never true  Transportation Needs: No Transportation Needs (01/23/2024)   PRAPARE - Administrator, Civil Service (Medical): No     Lack of Transportation (Non-Medical): No  Physical Activity: Not on file  Stress: Not on file  Social Connections: Unknown (01/23/2024)   Social Connection and Isolation Panel    Frequency of Communication with Friends  and Family: Three times a week    Frequency of Social Gatherings with Friends and Family: Three times a week    Attends Religious Services: More than 4 times per year    Active Member of Clubs or Organizations: Yes    Attends Banker Meetings: More than 4 times per year    Marital Status: Patient declined  Intimate Partner Violence: Not At Risk (01/23/2024)   Humiliation, Afraid, Rape, and Kick questionnaire    Fear of Current or Ex-Partner: No    Emotionally Abused: No    Physically Abused: No    Sexually Abused: No     Review of Systems    General:  No chills, fever, night sweats or weight changes.  Cardiovascular:  No chest pain, dyspnea on exertion, edema, orthopnea, palpitations, paroxysmal nocturnal dyspnea. Dermatological: No rash, lesions/masses Respiratory: No cough, dyspnea Urologic: No hematuria, dysuria Abdominal:   No nausea, vomiting, diarrhea, bright red blood per rectum, melena, or hematemesis Neurologic:  No visual changes, wkns, changes in mental status. All other systems reviewed and are otherwise negative except as noted above.  Physical Exam    VS:  BP 136/82   Pulse 73   Ht 5' 6 (1.676 m)   Wt 201 lb (91.2 kg)   SpO2 97%   BMI 32.44 kg/m  , BMI Body mass index is 32.44 kg/m. GEN: Well nourished, well developed, in no acute distress. HEENT: normal. Neck: Supple, no JVD, carotid bruits, or masses. Cardiac: RRR, 2/6 systolic murmur heard along right sternal border , rubs, or gallops. No clubbing, cyanosis, edema.  Radials/DP/PT 2+ and equal bilaterally.  Respiratory:  Respirations regular and unlabored, clear to auscultation bilaterally. GI: Soft, nontender, nondistended, BS + x 4. MS: no deformity or atrophy. Skin: warm  and dry, no rash. Neuro:  Strength and sensation are intact. Psych: Normal affect.  Accessory Clinical Findings    Recent Labs: 01/23/2024: BUN 21; Creatinine, Ser 1.04; Hemoglobin 11.7; Platelets 194; Potassium 3.9; Sodium 139   Recent Lipid Panel    Component Value Date/Time   CHOL 207 (H) 01/24/2024 0521   TRIG 39 01/24/2024 0521   HDL 76 01/24/2024 0521   CHOLHDL 2.7 01/24/2024 0521   VLDL 8 01/24/2024 0521   LDLCALC 123 (H) 01/24/2024 0521    ECG personally reviewed by me today-none today.  EKG 01/31/2023 normal sinus rhythm incomplete right bundle branch block 78 bpm  EKG 06/12/2022 normal sinus rhythm with sinus arrhythmia moderate voltage criteria for LVH 78 bpm  EKG 02/13/22 normal sinus rhythm left axis deviation moderate voltage criteria for LVH 72 bpm- No acute changes  Cardiac catheterization 06/09/2018  History obtained from chart review.83 y.o. female with small NSTEMI (inferolateral hypokinesis, preserved LVEF, peak troponin 1.0).  She presents now for diagnostic coronary angiography via the right radial approach.     IMPRESSION: Ms. Monica has essentially normal coronary arteries and a codominant system with an LVEDP of 18.  Medical therapy will be recommended for noncardiac chest pain although coronary vasospasm could also be a possibility.  The patient does admit to a lot of recent stress.  The sheath was removed and a TR band was placed on the right wrist to achieve patent hemostasis.  The patient left the lab in stable condition.  She can probably be discharged home later this morning.   Lauro Portal. MD, [facility] 06/09/2018 8:26 AM  Diagnostic Dominance: Co-dominant      Echo 02/12/20  IMPRESSIONS  1. Left ventricular ejection fraction, by estimation, is 55 to 60%. The  left ventricle has normal function. The left ventricle has no regional  wall motion abnormalities. Left ventricular diastolic parameters are  consistent with Grade I diastolic   dysfunction (impaired relaxation). Elevated left atrial pressure.   2. Right ventricular systolic function is normal. The right ventricular  size is normal. There is normal pulmonary artery systolic pressure. The  estimated right ventricular systolic pressure is 21.1 mmHg.   3. Left atrial size was mildly dilated.   4. The mitral valve is normal in structure. Mild mitral valve  regurgitation. No evidence of mitral stenosis.   5. The aortic valve is normal in structure. Aortic valve regurgitation is  not visualized. Mild aortic valve stenosis.   6. The inferior vena cava is normal in size with greater than 50%  respiratory variability, suggesting right atrial pressure of 3 mmHg.  Echocardiogram 09/16/2023  IMPRESSIONS     1. Left ventricular ejection fraction, by estimation, is 55 to 60%. The  left ventricle has normal function. The left ventricle has no regional  wall motion abnormalities. Left ventricular diastolic parameters are  indeterminate.   2. Right ventricular systolic function is normal. The right ventricular  size is normal. There is normal pulmonary artery systolic pressure.   3. Left atrial size was mildly dilated.   4. Mild mitral valve regurgitation.   5. AV is mildly thickened, calcified Peak and mean gradients through the  valve are 24 and 12.5 mm Hg respectively AVA (VTI) 1.2 cm2 2D imaging  consistent with mild aortic stenosis. COmpared to echo report from 2021,  very mild increase in mean gradient  (11 to 12.5 mm Hg). The aortic valve is tricuspid. Aortic valve  regurgitation is not visualized. Aortic valve sclerosis/calcification is  present, without any evidence of aortic stenosis.   6. No definite shunt.   FINDINGS   Left Ventricle: Left ventricular ejection fraction, by estimation, is 55  to 60%. The left ventricle has normal function. The left ventricle has no  regional wall motion abnormalities. The left ventricular internal cavity  size was normal in  size. There is   no left ventricular hypertrophy. Left ventricular diastolic parameters  are indeterminate.   Right Ventricle: The right ventricular size is normal. Right vetricular  wall thickness was not assessed. Right ventricular systolic function is  normal. There is normal pulmonary artery systolic pressure. The tricuspid  regurgitant velocity is 2.32 m/s,  and with an assumed right atrial pressure of 8 mmHg, the estimated right  ventricular systolic pressure is 29.5 mmHg.   Left Atrium: Left atrial size was mildly dilated.   Right Atrium: Right atrial size was normal in size.   Pericardium: There is no evidence of pericardial effusion.   Mitral Valve: Mild mitral annular calcification. Mild mitral valve  regurgitation.   Tricuspid Valve: The tricuspid valve is normal in structure. Tricuspid  valve regurgitation is trivial.   Aortic Valve: AV is mildly thickened, calcified Peak and mean gradients  through the valve are 24 and 12.5 mm Hg respectively AVA (VTI) 1.2 cm2 2D  imaging consistent with mild aortic stenosis. COmpared to echo report from  2021, very mild increase in mean  gradient (11 to 12.5 mm Hg). The aortic valve is tricuspid. Aortic valve  regurgitation is not visualized. Aortic valve sclerosis/calcification is  present, without any evidence of aortic stenosis. Aortic valve mean  gradient measures 12.5 mmHg. Aortic  valve peak gradient measures 23.5  mmHg. Aortic valve area, by VTI measures  1.18 cm.   Pulmonic Valve: The pulmonic valve was normal in structure. Pulmonic valve  regurgitation is mild.   Aorta: The aortic root and ascending aorta are structurally normal, with  no evidence of dilitation.   IAS/Shunts: No definite shunt.    Assessment & Plan   1.  Chest pain-no chest pain today.  Was seen and evaluated in the emergency department on 01/23/2024.  Cardiac troponins were negative.  EKG was reassuring.  It was felt that her symptoms may have been  related to elevated blood pressure.  Had cardiac catheterization 9/19 and was not noted to have coronary disease. Continue HCTZ, sublingual nitroglycerin  as needed Heart healthy low-sodium diet Maintain physical activity  Essential hypertension-BP today 134/82. Reduced hydrochlorothiazide  to 6.25 Continue HCTZ Maintain blood pressure log-reviewed   DOE-denies increased work of breathing.  Has history of pulmonary embolus and DVT.  She reports being compliant with apixaban .  Denies bleeding issues.    Continue apixaban  Maintain physical activity   Aortic stenosis-echocardiogram 09/16/2023 showed mild aortic stenosis.  Her LVEF was noted to be 55-60%  Hyperlipidemia-LDL 123 on 01/24/24.  Previously wished to defer statin therapy.   Continue Co Q 10 Continue high-fiber diet Maintain physical activity   Disposition: Follow-up with Dr. Katheryne Pane or me in 6 months.  Chet Cota. Sahmir Weatherbee NP-C     02/24/2024, 12:03 PM [facility] [address] Office [phone] Fax [phone]  Notice: This dictation was prepared with Dragon dictation along with smaller phrase technology. Any transcriptional errors that result from this process are unintentional and may not be corrected upon review.  I spent 14 minutes examining this patient, reviewing medications, and using patient centered shared decision making involving her cardiac care.  Prior to her visit I spent greater than 20 minutes reviewing her past medical history,  medications, and prior cardiac tests.

## 2024-02-24 ENCOUNTER — Encounter: Payer: Self-pay | Admitting: General Practice

## 2024-02-24 ENCOUNTER — Ambulatory Visit: Attending: General Practice | Admitting: General Practice

## 2024-02-24 VITALS — BP 136/82 | HR 73 | Ht 66.0 in | Wt 201.0 lb

## 2024-02-24 DIAGNOSIS — R072 Precordial pain: Secondary | ICD-10-CM

## 2024-02-24 DIAGNOSIS — E782 Mixed hyperlipidemia: Secondary | ICD-10-CM | POA: Diagnosis not present

## 2024-02-24 DIAGNOSIS — I350 Nonrheumatic aortic (valve) stenosis: Secondary | ICD-10-CM | POA: Diagnosis not present

## 2024-02-24 DIAGNOSIS — R0609 Other forms of dyspnea: Secondary | ICD-10-CM

## 2024-02-24 DIAGNOSIS — I10 Essential (primary) hypertension: Secondary | ICD-10-CM | POA: Diagnosis not present

## 2024-02-24 NOTE — Patient Instructions (Signed)
 Medication Instructions:  DECREASE Hydrochlorothiazide  to 6.25mg  Take once day day (may have to break 12.5 mg tablet in half or 25mg  tablet in to quarters) *If you need a refill on your cardiac medications before your next appointment, please call your pharmacy*  Lab Work: None ordered If you have labs (blood work) drawn today and your tests are completely normal, you will receive your results only by: MyChart Message (if you have MyChart) OR A paper copy in the mail If you have any lab test that is abnormal or we need to change your treatment, we will call you to review the results.  Testing/Procedures: None ordered  Follow-Up: At [facility], you and your health needs are our priority.  As part of our continuing mission to provide you with exceptional heart care, our providers are all part of one team.  This team includes your primary Cardiologist (physician) and Advanced Practice Providers or APPs (Physician Assistants and Nurse Practitioners) who all work together to provide you with the care you need, when you need it.  Your next appointment:   6 month(s)  Provider:   Lauro Portal, MD  or Lawana Pray, NP  We recommend signing up for the patient portal called MyChart.  Sign up information is provided on this After Visit Summary.  MyChart is used to connect with patients for Virtual Visits (Telemedicine).  Patients are able to view lab/test results, encounter notes, upcoming appointments, etc.  Non-urgent messages can be sent to your provider as well.   To learn more about what you can do with MyChart, go to [email or web address].   Other Instructions

## 2024-03-02 DIAGNOSIS — H43812 Vitreous degeneration, left eye: Secondary | ICD-10-CM | POA: Diagnosis not present

## 2024-03-02 DIAGNOSIS — H35351 Cystoid macular degeneration, right eye: Secondary | ICD-10-CM | POA: Diagnosis not present

## 2024-03-02 DIAGNOSIS — H26491 Other secondary cataract, right eye: Secondary | ICD-10-CM | POA: Diagnosis not present

## 2024-03-16 ENCOUNTER — Telehealth: Payer: Self-pay | Admitting: Family

## 2024-03-16 ENCOUNTER — Ambulatory Visit (INDEPENDENT_AMBULATORY_CARE_PROVIDER_SITE_OTHER): Admitting: Family

## 2024-03-16 ENCOUNTER — Encounter: Payer: Self-pay | Admitting: Family

## 2024-03-16 VITALS — BP 146/80 | HR 74 | Temp 97.2°F | Ht 66.0 in | Wt 200.0 lb

## 2024-03-16 DIAGNOSIS — Z7901 Long term (current) use of anticoagulants: Secondary | ICD-10-CM

## 2024-03-16 DIAGNOSIS — E669 Obesity, unspecified: Secondary | ICD-10-CM | POA: Insufficient documentation

## 2024-03-16 DIAGNOSIS — Z6832 Body mass index (BMI) 32.0-32.9, adult: Secondary | ICD-10-CM | POA: Diagnosis not present

## 2024-03-16 DIAGNOSIS — J4520 Mild intermittent asthma, uncomplicated: Secondary | ICD-10-CM | POA: Diagnosis not present

## 2024-03-16 DIAGNOSIS — Z86718 Personal history of other venous thrombosis and embolism: Secondary | ICD-10-CM | POA: Diagnosis not present

## 2024-03-16 DIAGNOSIS — E782 Mixed hyperlipidemia: Secondary | ICD-10-CM

## 2024-03-16 DIAGNOSIS — R430 Anosmia: Secondary | ICD-10-CM

## 2024-03-16 DIAGNOSIS — Z789 Other specified health status: Secondary | ICD-10-CM | POA: Diagnosis not present

## 2024-03-16 DIAGNOSIS — I10 Essential (primary) hypertension: Secondary | ICD-10-CM | POA: Diagnosis not present

## 2024-03-16 NOTE — Patient Instructions (Addendum)
  ENT:   Benay Velia Cecilio Paulino, PA-C  [address]  [address]  [place], [place] [address]  [phone] (Work)  [phone] (Fax)

## 2024-03-16 NOTE — Assessment & Plan Note (Signed)
 Overall stable, fluctuates with the weather

## 2024-03-16 NOTE — Assessment & Plan Note (Signed)
 Advised pt to f/u back with Ent.  May need CT sinuses otherwise neurology referral per note

## 2024-03-16 NOTE — Telephone Encounter (Signed)
 You have not been given in the past. Please advise

## 2024-03-16 NOTE — Telephone Encounter (Signed)
 Patient would like to know if she may just take baby asprin instead of Eloquis please call patient and advise.

## 2024-03-16 NOTE — Assessment & Plan Note (Signed)
 Continue hydrochlorothiazide  6.25 mg once daily

## 2024-03-16 NOTE — Assessment & Plan Note (Signed)
 Ordered lipid panel, pending results. Work on low cholesterol diet and exercise as tolerated Pt with stenosis, goal LDL < 70  Pt declines statin at this time, continue coq10 will continue to monitor lipid panel every 6 months

## 2024-03-16 NOTE — Progress Notes (Signed)
 New Patient Office Visit  Subjective:  Patient ID: Karen Owens, female    DOB: 03/20/1941  Age: 83 y.o. MRN: [id number]  CC:  Chief Complaint  Patient presents with   Establish Care    New patient- previously seeing [facility] Montie Pizza    HPI Karen Owens is here to establish care as a new patient.  Oriented to practice routines and expectations.  Prior provider was: Dr. Montie Pizza  Dr. Court cardiology   Pt is without acute concerns.   Allergies: having itchy watery eyes. Uses otc refresh eye drops.  She does have a runny nose, pnd, and does sneeze intermittently.   chronic concerns:  H/o DVT and pulmonary embolis, unprovoked, pt is on eliquis  5 mg bid. Echo 09/16/2023 with EF 55-60% mild left atrial dilation,mild MVR, AV mildly thickened consistent with mild aortic stenosis. 9/19 had cardiac cath with no indication of coronary disease   HLD: she is at LDL 123 she is on coq10 as per cardiology note she does not want to try statins.  Anosmia: recently seen by ENT and evaluated. CT scan sinus was recommended however pt states this was not completed. She last saw ENT back in 2023 but this was for an ear issue not the anosmia.       ROS: Negative unless specifically indicated above in HPI.   Current Outpatient Medications:    apixaban  (ELIQUIS ) 5 MG TABS tablet, Take 1 tablet (5 mg total) by mouth 2 (two) times daily., Disp: 60 tablet, Rfl: 0   B Complex CAPS, Take 1 capsule by mouth daily., Disp: , Rfl:    carboxymethylcellulose (REFRESH TEARS) 0.5 % SOLN, Place 1 drop into both eyes daily as needed (dry eye)., Disp: , Rfl:    Cholecalciferol (VITAMIN D) 125 MCG (5000 UT) CAPS, Take 1 capsule by mouth daily., Disp: , Rfl:    Coenzyme Q10 (CO Q 10 PO), Take 1 tablet by mouth daily., Disp: , Rfl:    hydrochlorothiazide  (HYDRODIURIL ) 12.5 MG tablet, Take 6.25 mg by mouth daily., Disp: , Rfl:    meclizine  (ANTIVERT ) 12.5 MG tablet, Take 12.5 mg by mouth  3 (three) times daily as needed for dizziness., Disp: , Rfl:    nitroGLYCERIN  (NITROSTAT ) 0.4 MG SL tablet, Place 1 tablet (0.4 mg total) under the tongue every 5 (five) minutes x 3 doses as needed., Disp: 25 tablet, Rfl: 3 Past Medical History:  Diagnosis Date   Arthritis    Asthma    Complication of anesthesia    difficulty waking up   DVT (deep venous thrombosis) (HCC)    Refusal of blood transfusions as patient is Jehovah's Witness    Thyroid  nodule    Past Surgical History:  Procedure Laterality Date   DILATION AND CURETTAGE OF UTERUS     LEFT HEART CATH AND CORONARY ANGIOGRAPHY N/A 06/09/2018   Procedure: LEFT HEART CATH AND CORONARY ANGIOGRAPHY;  Surgeon: Court Dorn PARAS, MD;  Location: [facility];  Service: Cardiovascular;  Laterality: N/A;   TOTAL KNEE ARTHROPLASTY Left    TOTAL KNEE ARTHROPLASTY  08/20/2012   Procedure: TOTAL KNEE ARTHROPLASTY;  Surgeon: Toribio JULIANNA Chancy, MD;  Location: [facility] OR;  Service: Orthopedics;  Laterality: Right;    Objective:   Today's Vitals: BP (!) 146/80   Pulse 74   Temp (!) 97.2 F (36.2 C) (Temporal)   Ht 5' 6 (1.676 m)   Wt 200 lb (90.7 kg)   BMI 32.28  kg/m   Physical Exam Constitutional:      General: She is not in acute distress.    Appearance: Normal appearance. She is normal weight. She is not ill-appearing, toxic-appearing or diaphoretic.  HENT:     Right Ear: Hearing, tympanic membrane, ear canal and external ear normal.     Left Ear: Hearing, tympanic membrane, ear canal and external ear normal.     Nose:     Right Turbinates: Swollen and pale.     Mouth/Throat:     Pharynx: Postnasal drip present.  Cardiovascular:     Rate and Rhythm: Normal rate and regular rhythm.     Heart sounds: Murmur heard.  Pulmonary:     Effort: Pulmonary effort is normal.     Breath sounds: Normal breath sounds.  Abdominal:     General: Abdomen is flat.     Tenderness: There is no abdominal tenderness. There is no right CVA  tenderness or left CVA tenderness.  Neurological:     General: No focal deficit present.     Mental Status: She is alert and oriented to person, place, and time. Mental status is at baseline.     Motor: No weakness.  Psychiatric:        Mood and Affect: Mood normal.        Behavior: Behavior normal.        Thought Content: Thought content normal.        Judgment: Judgment normal.     Assessment & Plan:  No blood products  Mixed hyperlipidemia Assessment & Plan: Ordered lipid panel, pending results. Work on low cholesterol diet and exercise as tolerated Pt with stenosis, goal LDL < 70  Pt declines statin at this time, continue coq10 will continue to monitor lipid panel every 6 months   Essential hypertension Assessment & Plan: Continue hydrochlorothiazide  6.25 mg once daily    Mild intermittent asthma without complication Assessment & Plan: Overall stable, fluctuates with the weather   Anticoagulated  History of DVT in adulthood  Adult BMI 32.0-32.9 kg/sq m  Anosmia Assessment & Plan: Advised pt to f/u back with Ent.  May need CT sinuses otherwise neurology referral per note     Follow-up: Return in about 6 months (around 09/16/2024) for f/u CPE.   Karen Patrick, FNP

## 2024-03-17 NOTE — Telephone Encounter (Signed)
 I would prefer she stay on eliquis  per her cardiologist instruction.  If she has further questions about this I'd advise she reach out to Dr. Ranee office.

## 2024-03-17 NOTE — Telephone Encounter (Signed)
 I have tried contacting patient twice but when phone answers no one answers me and theres just shuffling around.

## 2024-03-18 NOTE — Telephone Encounter (Signed)
 Called pt and when someone answer I couldn't hear anyone also

## 2024-03-18 NOTE — Telephone Encounter (Signed)
 Pt returned my call, I advised her of PCP's recommendations. Pt also said her cardiologist told her the same thing.

## 2024-04-01 ENCOUNTER — Telehealth: Payer: Self-pay | Admitting: Family

## 2024-04-01 NOTE — Telephone Encounter (Signed)
 Spoke with pt. States that when she was here last, [name] recommended some OTC meds for her runny nose and PND but she can't find her AVS with the information on it. Per the pt, these were hand written on her AVS.  [name] - please advise. Thank you.

## 2024-04-01 NOTE — Telephone Encounter (Signed)
 If she has itchy water eyes or ears, allegra.   However she had swollen sacs in her nose (seen with allergies) confirm she does not have glaucoma and if no, start over the counter flonase.

## 2024-04-01 NOTE — Telephone Encounter (Signed)
 Patient stopped in office today and states she was giving information regarding what to take OTC for sinus, states she would like a call on what she may take OTC, no mention in chart.

## 2024-04-02 NOTE — Telephone Encounter (Signed)
 Spoke with pt and she is aware of [name]'s message.

## 2024-04-24 ENCOUNTER — Telehealth: Payer: Self-pay | Admitting: Family

## 2024-04-24 NOTE — Telephone Encounter (Signed)
 Copied from CRM [id number]. Topic: Clinical - Prescription Issue >> Apr 24, 2024 11:34 AM Burnard DEL wrote: Reason for CRM: Patient called in stating that  hydrochlorothiazide  (HYDRODIURIL ) 12.5 MG tablet was prescribed to er by previous provider.However the medication makes her use the bathroom a lot. She would like to know if a different medication could possibly be prescribed to her that may not make her use the bathroom as much?  [id number] [place], [place] - [address]  Phone: [phone] Fax: [phone]

## 2024-04-24 NOTE — Telephone Encounter (Signed)
 LM for pt to return call.

## 2024-04-24 NOTE — Telephone Encounter (Signed)
 Patient called again to follow up on med request.

## 2024-04-27 NOTE — Telephone Encounter (Signed)
 Have pt check her blood pressure twice daily without the medication and then f/u next week with blood pressure log and we wil determine if she needs another agent.   The blood pressure medication she is currently on is 1/4 of the normal starting dose so she may not need this.

## 2024-04-27 NOTE — Telephone Encounter (Signed)
 Spoke with pt and she is aware of [name]'s recommendation. OV has been scheduled for 05/04/24 at 1240.

## 2024-04-27 NOTE — Telephone Encounter (Addendum)
 Spoke with pt and she states that she is currently taking hydrochlorothiazide  12.5mg  >> 0.5 tablet daily. Reports that this makes her urinate too much all day and would like an alternative. Pt is aware that Ginger is out of the office today and is okay with a call back tomorrow.  [name] - please advise. Thank you.

## 2024-05-04 ENCOUNTER — Ambulatory Visit: Admitting: Family

## 2024-05-07 ENCOUNTER — Ambulatory Visit: Payer: Self-pay | Admitting: Family

## 2024-05-07 ENCOUNTER — Ambulatory Visit (INDEPENDENT_AMBULATORY_CARE_PROVIDER_SITE_OTHER): Admitting: Family

## 2024-05-07 ENCOUNTER — Encounter: Payer: Self-pay | Admitting: Family

## 2024-05-07 VITALS — BP 122/74 | HR 84 | Temp 98.3°F | Ht 66.0 in | Wt 198.5 lb

## 2024-05-07 DIAGNOSIS — F419 Anxiety disorder, unspecified: Secondary | ICD-10-CM | POA: Diagnosis not present

## 2024-05-07 DIAGNOSIS — I10 Essential (primary) hypertension: Secondary | ICD-10-CM

## 2024-05-07 DIAGNOSIS — F32A Depression, unspecified: Secondary | ICD-10-CM | POA: Diagnosis not present

## 2024-05-07 DIAGNOSIS — E782 Mixed hyperlipidemia: Secondary | ICD-10-CM | POA: Diagnosis not present

## 2024-05-07 DIAGNOSIS — D508 Other iron deficiency anemias: Secondary | ICD-10-CM | POA: Diagnosis not present

## 2024-05-07 DIAGNOSIS — R5383 Other fatigue: Secondary | ICD-10-CM | POA: Diagnosis not present

## 2024-05-07 LAB — BASIC METABOLIC PANEL WITH GFR
BUN: 18 mg/dL (ref 6–23)
CO2: 30 meq/L (ref 19–32)
Calcium: 10.9 mg/dL — ABNORMAL HIGH (ref 8.4–10.5)
Chloride: 104 meq/L (ref 96–112)
Creatinine, Ser: 1.08 mg/dL (ref 0.40–1.20)
GFR: 48.29 mL/min — ABNORMAL LOW (ref >60.00)
Glucose, Bld: 100 mg/dL — ABNORMAL HIGH (ref 70–99)
Potassium: 4.1 meq/L (ref 3.5–5.1)
Sodium: 141 meq/L (ref 135–145)

## 2024-05-07 LAB — CBC
HCT: 36.8 % (ref 36.0–46.0)
Hemoglobin: 12.1 g/dL (ref 12.0–15.0)
MCHC: 32.9 g/dL (ref 30.0–36.0)
MCV: 96.8 fl (ref 78.0–100.0)
Platelets: 216 K/uL (ref 150.0–400.0)
RBC: 3.8 Mil/uL — ABNORMAL LOW (ref 3.87–5.11)
RDW: 13.6 % (ref 11.5–15.5)
WBC: 6.5 K/uL (ref 4.0–10.5)

## 2024-05-07 LAB — LIPID PANEL
Cholesterol: 226 mg/dL — ABNORMAL HIGH (ref 0–200)
HDL: 73.7 mg/dL (ref >39.00)
LDL Cholesterol: 142 mg/dL — ABNORMAL HIGH (ref 0–99)
NonHDL: 152.46
Total CHOL/HDL Ratio: 3
Triglycerides: 52 mg/dL (ref 0.0–149.0)
VLDL: 10.4 mg/dL (ref 0.0–40.0)

## 2024-05-07 LAB — IBC + FERRITIN
Ferritin: 149.8 ng/mL (ref 10.0–291.0)
Iron: 88 ug/dL (ref 42–145)
Saturation Ratios: 32.7 % (ref 20.0–50.0)
TIBC: 268.8 ug/dL (ref 250.0–450.0)
Transferrin: 192 mg/dL — ABNORMAL LOW (ref 212.0–360.0)

## 2024-05-07 LAB — VITAMIN D 25 HYDROXY (VIT D DEFICIENCY, FRACTURES): VITD: 30.81 ng/mL (ref 30.00–100.00)

## 2024-05-07 LAB — TSH: TSH: 0.98 u[IU]/mL (ref 0.35–5.50)

## 2024-05-07 MED ORDER — MIRTAZAPINE 7.5 MG PO TABS: 7.5000 mg | ORAL_TABLET | Freq: Every day | ORAL | 1 refills | Status: DC

## 2024-05-07 MED ORDER — HYDROCHLOROTHIAZIDE 12.5 MG PO TABS: 12.5000 mg | ORAL_TABLET | Freq: Every day | ORAL | Status: DC

## 2024-05-07 NOTE — Patient Instructions (Signed)
[facility]   [address]   [address]   , [place] [address]   [phone]     

## 2024-05-07 NOTE — Progress Notes (Signed)
 "  Established Patient Office Visit  Subjective:      CC:  Chief Complaint  Patient presents with   Hypertension    HPI: Karen Owens is a 83 y.o. female presenting on 05/07/2024 for Hypertension .  Discussed the use of AI scribe software for clinical note transcription with the patient, who gave verbal consent to proceed.  History of Present Illness Karen Owens is an 83 year old female who presents with fatigue and decreased energy levels.  She experiences fatigue and decreased energy levels, leading to a reduction in her usual activities. She has not been exercising as much, and has lost interest in activities she previously enjoyed, such as cooking. No significant weight changes have been noted, although her clothes have become too big. No abnormal weight gain or loss.  She lives with her daughter and grandson, who she describes as disrespectful and a source of stress, affecting her mood and contributing to her fatigue and decreased motivation. She has considered moving to a senior citizen home but is concerned about the cost.  She has a history of anosmia and has previously seen an ENT for ear issues. A CT scan of the sinuses was recommended but not completed, and she has not followed up with the ENT regarding her anosmia.  Her social history includes being a widow for about three years, having six children, and being active in her community and religious groups. She has supportive friends and family, including a grandson who calls her daily.         Social history:  Relevant past medical, surgical, family and social history reviewed and updated as indicated. Interim medical history since our last visit reviewed.  Allergies and medications reviewed and updated.  DATA REVIEWED: CHART IN EPIC     ROS: Negative unless specifically indicated above in HPI.    Current Outpatient Medications:    apixaban  (ELIQUIS ) 5 MG TABS tablet, Take 1 tablet (5 mg total) by  mouth 2 (two) times daily., Disp: 60 tablet, Rfl: 0   B Complex CAPS, Take 1 capsule by mouth daily., Disp: , Rfl:    carboxymethylcellulose (REFRESH TEARS) 0.5 % SOLN, Place 1 drop into both eyes daily as needed (dry eye)., Disp: , Rfl:    Cholecalciferol (VITAMIN D ) 125 MCG (5000 UT) CAPS, Take 1 capsule by mouth daily., Disp: , Rfl:    Coenzyme Q10 (CO Q 10 PO), Take 1 tablet by mouth daily., Disp: , Rfl:    ferrous sulfate 324 MG TBEC, Take 324 mg by mouth., Disp: , Rfl:    fexofenadine (ALLEGRA) 180 MG tablet, Take 180 mg by mouth daily., Disp: , Rfl:    meclizine  (ANTIVERT ) 12.5 MG tablet, Take 12.5 mg by mouth 3 (three) times daily as needed for dizziness., Disp: , Rfl:    mirtazapine  (REMERON ) 7.5 MG tablet, Take 1 tablet (7.5 mg total) by mouth at bedtime., Disp: 30 tablet, Rfl: 1   nitroGLYCERIN  (NITROSTAT ) 0.4 MG SL tablet, Place 1 tablet (0.4 mg total) under the tongue every 5 (five) minutes x 3 doses as needed., Disp: 25 tablet, Rfl: 3   hydrochlorothiazide  (HYDRODIURIL ) 12.5 MG tablet, Take 1 tablet (12.5 mg total) by mouth daily., Disp: , Rfl:         Objective:        BP 122/74 (BP Location: Left Arm, Patient Position: Sitting, Cuff Size: Large)   Pulse 84   Temp 98.3 F (36.8 C) (Temporal)   Ht 5' 6 (  1.676 m)   Wt 198 lb 8 oz (90 kg)   SpO2 96%   BMI 32.04 kg/m   Physical Exam VITALS: BP- 122/74 CARDIOVASCULAR: Murmur present, normal rhythm.  Wt Readings from Last 3 Encounters:  05/07/24 198 lb 8 oz (90 kg)  03/16/24 200 lb (90.7 kg)  02/24/24 201 lb (91.2 kg)    Physical Exam Constitutional:      General: She is not in acute distress.    Appearance: Normal appearance. She is normal weight. She is not ill-appearing, toxic-appearing or diaphoretic.  HENT:     Head: Normocephalic.  Cardiovascular:     Rate and Rhythm: Normal rate and regular rhythm.     Heart sounds: Murmur heard.     Systolic murmur is present.  Pulmonary:     Effort: Pulmonary  effort is normal.  Musculoskeletal:        General: Normal range of motion.  Neurological:     General: No focal deficit present.     Mental Status: She is alert and oriented to person, place, and time. Mental status is at baseline.  Psychiatric:        Mood and Affect: Mood normal.        Behavior: Behavior normal.        Thought Content: Thought content normal.        Judgment: Judgment normal.          Results DIAGNOSTIC Echocardiogram: Normal systolic function, good ejection fraction, intermediate diastolic parameters, mild mitral regurgitation, mild aortic stenosis (09/16/2023)  Assessment & Plan:   Assessment and Plan Assessment & Plan Depression with fatigue Symptoms include decreased energy, lack of interest in activities, and potential mild depression. Stressors at home, including a difficult living situation with her grandson, may contribute to symptoms. Labs from May showed normal iron, B12, and folate levels. Thyroid  function not previously checked. - Order thyroid  function tests - Prescribe mirtazapine  (Remeron ) to be taken at night, discussing potential side effects such as lethargy, headache, nausea, and rare risk of suicidal thoughts, especially in younger patients - Monitor for side effects and therapeutic response, emphasizing gradual onset of effects over 4-6 weeks - Discuss financial concerns and potential low-cost options - Follow up in 6 weeks to assess medication efficacy and side effects  Essential hypertension Blood pressure is well-controlled at 122/74 mmHg on hydrochlorothiazide  12.5 mg daily. Previously experienced dizziness on a lower dose. - Continue hydrochlorothiazide  12.5 mg daily - Monitor blood pressure regularly - Ensure adequate hydration - Report any symptoms of hypotension such as dizziness or lightheadedness  Iron deficiency anemia Previously noted mild anemia with stable iron levels. Currently taking iron supplements. - Continue iron  supplementation - Monitor for constipation as a side effect  Anosmia Persistent anosmia. Previous ENT evaluation recommended a CT scan of the sinuses, which was not completed. - Follow up with ENT for further evaluation and potential CT scan of the sinuses  Mixed hyperlipidemia Importance of monitoring cholesterol to manage cardiovascular risk discussed. - Order lipid panel to assess current cholesterol levels  Mild mitral valve regurgitation and mild aortic stenosis Known mild mitral valve regurgitation and mild aortic stenosis with normal cardiac function. - Continue regular follow-up with cardiologist  Hypercalcemia Previously noted elevated calcium  levels. Discussed potential relationship between vitamin D  levels and calcium . - Order vitamin D  level to assess current status  Recording duration: 29 minutes      Return in about 4 weeks (around 06/04/2024) for f/u anxiety.  [name], MSN, APRN, FNP-C [facility]     "

## 2024-05-12 NOTE — Progress Notes (Signed)
 noted

## 2024-05-13 ENCOUNTER — Telehealth: Payer: Self-pay | Admitting: Family

## 2024-05-13 NOTE — Telephone Encounter (Signed)
 Spoke with pt and she states that she has been taking iron pills and she is now constipated and her stool is black. Pt states that she is now not taking the iron pills for the past 3 days. She is not in any distress at this time and would like this message to be sent to [facility] to address, not another provider.

## 2024-05-13 NOTE — Telephone Encounter (Signed)
 Copied from CRM [id number]. Topic: Clinical - Medical Advice >> May 13, 2024  4:13 PM [name] wrote: Reason for CRM: Patient took an iron pill and now her stool is black. She would like to know what to do.

## 2024-05-13 NOTE — Telephone Encounter (Signed)
 This is a common side effect Since stopping the iron has the black stool improved?  If no then she'd need to be seen but otherwise if symptoms improved/resolved then just stop taking the iron for now.

## 2024-05-14 ENCOUNTER — Ambulatory Visit: Payer: Self-pay

## 2024-05-14 NOTE — Telephone Encounter (Signed)
 There was already an encounter open about this issue. Pt has been scheduled to see Dr. Avelina 05/15/24 at 4pm.

## 2024-05-14 NOTE — Telephone Encounter (Signed)
 FYI Only or Action Required?: Action required by provider: Patient requesting Call Back. Offered appt with another pcp, pt declined. Advised UC/ED.  Patient was last seen in primary care on 05/07/2024 by Corwin Antu, FNP.  Called Nurse Triage reporting Stool Color Change.  Symptoms began several days ago.  Interventions attempted: Other: stopped iron supplement.  Symptoms are: unchanged.  Triage Disposition: See PCP When Office is Open (Within 3 Days)  Patient/caregiver understands and will follow disposition?: Yes      Copied from CRM [id number]. Topic: Clinical - Red Word Triage >> May 14, 2024 12:38 PM Suzen RAMAN wrote: Red Word that prompted transfer to Nurse Triage:  very dark stool Reason for Disposition  [1] Abnormal color is unexplained AND [2] persists > 24 hours    Pt only wants appt with pcp, no available appts until 05/18/24, advised UC/ED.  Answer Assessment - Initial Assessment Questions Patient requesting MD recommendations/Call Back. Pt declined appt with other providers, only requests appt with [facility] FNP. Advised UC/ED.  Patient reports Last took iron supplement 3 days,  Started iron supplement couple weeks ago, stopped 3 days because it was  causing constipation, then noticed stool darken.  Last BM yesterday; black and formed  Eliquis -blood thinner; currently  1. COLOR: What color is your stool? Is that color in part or all of the stool? (e.g., black, clay-colored, green, red)      Black stool, denies any blood 2. ONSET: When did you first notice this color change?     Several days ago 3. CAUSE: Have you eaten any food or taken any medicine of this color? Note: See listing in Background Information section.      Iron last take 3 days ago 4. OTHER SYMPTOMS: Do you have any other symptoms? (e.g., abdomen pain, diarrhea, fever, yellow eyes or skin).     Denies bleeding, blood appearing stools, abd pain, diarrhea, weakness, dizziness, difficulty  breathing, fever, yellow eyes.  Protocols used: Stools - Unusual Color-A-AH

## 2024-05-14 NOTE — Telephone Encounter (Signed)
 Attempted to contact pt, someone answered the phone but no one answered. Will try back.

## 2024-05-14 NOTE — Telephone Encounter (Signed)
 Spoke with pt and she is aware of [name]'s message. States her stool is still black. OV has been scheduled with Dr. Avelina 05/15/24 at 4pm.

## 2024-05-15 ENCOUNTER — Encounter: Payer: Self-pay | Admitting: Family Medicine

## 2024-05-15 ENCOUNTER — Ambulatory Visit (INDEPENDENT_AMBULATORY_CARE_PROVIDER_SITE_OTHER): Admitting: Family Medicine

## 2024-05-15 VITALS — BP 136/80 | HR 79 | Temp 98.6°F | Ht 66.0 in | Wt 199.1 lb

## 2024-05-15 DIAGNOSIS — K5900 Constipation, unspecified: Secondary | ICD-10-CM

## 2024-05-15 DIAGNOSIS — Z23 Encounter for immunization: Secondary | ICD-10-CM

## 2024-05-15 DIAGNOSIS — R195 Other fecal abnormalities: Secondary | ICD-10-CM | POA: Diagnosis not present

## 2024-05-15 NOTE — Patient Instructions (Signed)
 Can use Miralax or Senakot for constipation.  Increase water and fiber in diet for constipation.  Stop iron.  Eat foods high in iron.

## 2024-05-15 NOTE — Progress Notes (Signed)
 Patient ID: Karen Owens, female    DOB: 1941/06/29, 83 y.o.   MRN: [id number]  This visit was conducted in person.  BP 136/80   Pulse 79   Temp 98.6 F (37 C) (Temporal)   Ht 5' 6 (1.676 m)   Wt 199 lb 2 oz (90.3 kg)   SpO2 97%   BMI 32.14 kg/m    CC:  Chief Complaint  Patient presents with   Melena    Subjective:   HPI: Karen Owens is a 83 y.o. female patient of [name] with history of DVT and PE on anticoagulant presenting on 05/15/2024 for Melena    She reports new onset dark stool in last  5 days... 3 episodes of darker  Black stools.  Has also been more constipated lately in last week as well.  No abd pain.  No N/V.  Stopped iron earlier this week.. still looked dark but less so.   Occurred after taking iron  since 8/28 with low transferrin. Okay to remain off Reviewed cbc as well.   (nml)    On  Eliquis  for a longtime for past clot.  No past colonoscopy and endoscopy on record.     No lightheadedness, no fatigue, no SOB, no cest pain.. she feels well overall.   Relevant past medical, surgical, family and social history reviewed and updated as indicated. Interim medical history since our last visit reviewed. Allergies and medications reviewed and updated. Outpatient Medications Prior to Visit  Medication Sig Dispense Refill   apixaban  (ELIQUIS ) 5 MG TABS tablet Take 1 tablet (5 mg total) by mouth 2 (two) times daily. 60 tablet 0   B Complex CAPS Take 1 capsule by mouth daily.     carboxymethylcellulose (REFRESH TEARS) 0.5 % SOLN Place 1 drop into both eyes daily as needed (dry eye).     Cholecalciferol (VITAMIN D ) 125 MCG (5000 UT) CAPS Take 1 capsule by mouth daily.     Coenzyme Q10 (CO Q 10 PO) Take 1 tablet by mouth daily.     fexofenadine (ALLEGRA) 180 MG tablet Take 180 mg by mouth daily.     hydrochlorothiazide  (HYDRODIURIL ) 12.5 MG tablet Take 1 tablet (12.5 mg total) by mouth daily.     meclizine  (ANTIVERT ) 12.5 MG tablet Take 12.5 mg by  mouth 3 (three) times daily as needed for dizziness.     mirtazapine  (REMERON ) 7.5 MG tablet Take 1 tablet (7.5 mg total) by mouth at bedtime. 30 tablet 1   nitroGLYCERIN  (NITROSTAT ) 0.4 MG SL tablet Place 1 tablet (0.4 mg total) under the tongue every 5 (five) minutes x 3 doses as needed. 25 tablet 3   ferrous sulfate 324 MG TBEC Take 324 mg by mouth. (Patient not taking: Reported on 05/15/2024)     No facility-administered medications prior to visit.     Per HPI unless specifically indicated in ROS section below Review of Systems  Constitutional:  Negative for chills and fever.  HENT:  Negative for congestion and ear pain.   Eyes:  Negative for pain and redness.  Respiratory:  Negative for cough and shortness of breath.   Cardiovascular:  Negative for chest pain, palpitations and leg swelling.  Gastrointestinal:  Positive for blood in stool. Negative for abdominal pain, constipation, diarrhea, nausea and vomiting.  Genitourinary:  Negative for dysuria.  Musculoskeletal:  Negative for myalgias.  Skin:  Negative for rash.  Neurological:  Negative for dizziness.  Psychiatric/Behavioral:  The patient is not nervous/anxious.  Objective:  BP 136/80   Pulse 79   Temp 98.6 F (37 C) (Temporal)   Ht 5' 6 (1.676 m)   Wt 199 lb 2 oz (90.3 kg)   SpO2 97%   BMI 32.14 kg/m   Wt Readings from Last 3 Encounters:  05/15/24 199 lb 2 oz (90.3 kg)  05/07/24 198 lb 8 oz (90 kg)  03/16/24 200 lb (90.7 kg)      Physical Exam Exam conducted with a chaperone present.  Constitutional:      General: She is not in acute distress.    Appearance: Normal appearance. She is well-developed. She is not ill-appearing or toxic-appearing.  HENT:     Head: Normocephalic.     Right Ear: Hearing, tympanic membrane, ear canal and external ear normal. Tympanic membrane is not erythematous, retracted or bulging.     Left Ear: Hearing, tympanic membrane, ear canal and external ear normal. Tympanic membrane is  not erythematous, retracted or bulging.     Nose: No mucosal edema or rhinorrhea.     Right Sinus: No maxillary sinus tenderness or frontal sinus tenderness.     Left Sinus: No maxillary sinus tenderness or frontal sinus tenderness.     Mouth/Throat:     Pharynx: Uvula midline.  Eyes:     General: Lids are normal. Lids are everted, no foreign bodies appreciated.     Conjunctiva/sclera: Conjunctivae normal.     Pupils: Pupils are equal, round, and reactive to light.  Neck:     Thyroid : No thyroid  mass or thyromegaly.     Vascular: No carotid bruit.     Trachea: Trachea normal.  Cardiovascular:     Rate and Rhythm: Normal rate and regular rhythm.     Pulses: Normal pulses.     Heart sounds: Normal heart sounds, S1 normal and S2 normal. No murmur heard.    No friction rub. No gallop.  Pulmonary:     Effort: Pulmonary effort is normal. No tachypnea or respiratory distress.     Breath sounds: Normal breath sounds. No decreased breath sounds, wheezing, rhonchi or rales.  Abdominal:     General: Bowel sounds are normal.     Palpations: Abdomen is soft.     Tenderness: There is no abdominal tenderness. There is no right CVA tenderness or left CVA tenderness.  Genitourinary:    Rectum: Guaiac result negative. No mass, tenderness, anal fissure, external hemorrhoid or internal hemorrhoid.  Musculoskeletal:     Cervical back: Normal range of motion and neck supple.  Skin:    General: Skin is warm and dry.     Findings: No rash.  Neurological:     Mental Status: She is alert.  Psychiatric:        Mood and Affect: Mood is not anxious or depressed.        Speech: Speech normal.        Behavior: Behavior normal. Behavior is cooperative.        Thought Content: Thought content normal.        Judgment: Judgment normal.       Results for orders placed or performed in visit on 05/07/24  TSH   Collection Time: 05/07/24 12:39 PM  Result Value Ref Range   TSH 0.98 0.35 - 5.50 uIU/mL  CBC    Collection Time: 05/07/24 12:39 PM  Result Value Ref Range   WBC 6.5 4.0 - 10.5 K/uL   RBC 3.80 (L) 3.87 - 5.11 Mil/uL   Platelets 216.0  150.0 - 400.0 K/uL   Hemoglobin 12.1 12.0 - 15.0 g/dL   HCT 63.1 63.9 - 53.9 %   MCV 96.8 78.0 - 100.0 fl   MCHC 32.9 30.0 - 36.0 g/dL   RDW 86.3 88.4 - 84.4 %  IBC + Ferritin   Collection Time: 05/07/24 12:39 PM  Result Value Ref Range   Iron 88 42 - 145 ug/dL   Transferrin 807.9 (L) 212.0 - 360.0 mg/dL   Saturation Ratios 67.2 20.0 - 50.0 %   Ferritin 149.8 10.0 - 291.0 ng/mL   TIBC 268.8 250.0 - 450.0 mcg/dL  Basic Metabolic Panel   Collection Time: 05/07/24 12:39 PM  Result Value Ref Range   Sodium 141 135 - 145 mEq/L   Potassium 4.1 3.5 - 5.1 mEq/L   Chloride 104 96 - 112 mEq/L   CO2 30 19 - 32 mEq/L   Glucose, Bld 100 (H) 70 - 99 mg/dL   BUN 18 6 - 23 mg/dL   Creatinine, Ser 8.91 0.40 - 1.20 mg/dL   GFR 51.70 (L) >39.99 mL/min   Calcium  10.9 (H) 8.4 - 10.5 mg/dL  VITAMIN D  25 Hydroxy (Vit-D Deficiency, Fractures)   Collection Time: 05/07/24 12:39 PM  Result Value Ref Range   VITD 30.81 30.00 - 100.00 ng/mL  Lipid panel   Collection Time: 05/07/24 12:39 PM  Result Value Ref Range   Cholesterol 226 (H) 0 - 200 mg/dL   Triglycerides 47.9 0.0 - 149.0 mg/dL   HDL 26.29 >60.99 mg/dL   VLDL 89.5 0.0 - 59.9 mg/dL   LDL Cholesterol 857 (H) 0 - 99 mg/dL   Total CHOL/HDL Ratio 3    NonHDL 152.46     Assessment and Plan  Dark stools Assessment & Plan: Acute, color change most likely secondary to iron.  No blood on Hemoccult card in office today.   Need for influenza vaccination -     Flu vaccine HIGH DOSE PF(Fluzone Trivalent)  Acute constipation Assessment & Plan: Acute, caused by recent initiation of iron.  Can use Miralax or Senakot for constipation.  Increase water and fiber in diet for constipation.  Stop iron.  Eat foods high in iron.  Follow-up with PCP regarding rechecking iron and CBC     No follow-ups on file.    Greig Ring, MD

## 2024-06-03 DIAGNOSIS — K5900 Constipation, unspecified: Secondary | ICD-10-CM | POA: Insufficient documentation

## 2024-06-03 NOTE — Assessment & Plan Note (Signed)
 Acute, caused by recent initiation of iron.  Can use Miralax or Senakot for constipation.  Increase water and fiber in diet for constipation.  Stop iron.  Eat foods high in iron.  Follow-up with PCP regarding rechecking iron and CBC

## 2024-06-03 NOTE — Assessment & Plan Note (Signed)
 Acute, color change most likely secondary to iron.  No blood on Hemoccult card in office today.

## 2024-06-08 ENCOUNTER — Ambulatory Visit (INDEPENDENT_AMBULATORY_CARE_PROVIDER_SITE_OTHER): Admitting: Family

## 2024-06-08 ENCOUNTER — Encounter: Payer: Self-pay | Admitting: Family

## 2024-06-08 VITALS — BP 136/82 | HR 72 | Temp 98.2°F | Ht 66.0 in | Wt 199.0 lb

## 2024-06-08 DIAGNOSIS — Z23 Encounter for immunization: Secondary | ICD-10-CM | POA: Diagnosis not present

## 2024-06-08 DIAGNOSIS — R011 Cardiac murmur, unspecified: Secondary | ICD-10-CM | POA: Insufficient documentation

## 2024-06-08 NOTE — Progress Notes (Signed)
 Established Patient Office Visit  Subjective:      CC:  Chief Complaint  Patient presents with   Medical Management of Chronic Issues    HPI: Karen Owens is a 83 y.o. female presenting on 06/08/2024 for Medical Management of Chronic Issues .  Discussed the use of AI scribe software for clinical note transcription with the patient, who gave verbal consent to proceed.  History of Present Illness Karen Owens is an 83 year old female who presents for follow-up on anxiety medication and recent dark stools.  She was prescribed mirtazapine  for anxiety but did not take it as her situational stressors improved, leading to better sleep. She still has the prescription but has not felt the need to use it recently.  She experienced three episodes of dark stools after taking iron supplements, which resolved after discontinuing the iron. A hemoccult test in the office was negative, and her anemia has improved. She reports being on Eliquis  for a past blood clot. She has not noticed further episodes of dark stools.  She has a history of mild mitral valve regurgitation and mild aortic stenosis, as noted in an echocardiogram from September 16, 2023. She experiences shortness of breath when climbing stairs. She received a flu shot on May 15, 2024, and is due for a pneumonia vaccine. She has not had a bone density scan.         Social history:  Relevant past medical, surgical, family and social history reviewed and updated as indicated. Interim medical history since our last visit reviewed.  Allergies and medications reviewed and updated.  DATA REVIEWED: CHART IN EPIC     ROS: Negative unless specifically indicated above in HPI.    Current Outpatient Medications:    apixaban  (ELIQUIS ) 5 MG TABS tablet, Take 1 tablet (5 mg total) by mouth 2 (two) times daily., Disp: 60 tablet, Rfl: 0   B Complex CAPS, Take 1 capsule by mouth daily., Disp: , Rfl:    carboxymethylcellulose  (REFRESH TEARS) 0.5 % SOLN, Place 1 drop into both eyes daily as needed (dry eye)., Disp: , Rfl:    Cholecalciferol (VITAMIN D ) 125 MCG (5000 UT) CAPS, Take 1 capsule by mouth daily., Disp: , Rfl:    Coenzyme Q10 (CO Q 10 PO), Take 1 tablet by mouth daily., Disp: , Rfl:    ferrous sulfate 324 MG TBEC, Take 324 mg by mouth., Disp: , Rfl:    fexofenadine (ALLEGRA) 180 MG tablet, Take 180 mg by mouth daily., Disp: , Rfl:    hydrochlorothiazide  (HYDRODIURIL ) 12.5 MG tablet, Take 1 tablet (12.5 mg total) by mouth daily., Disp: , Rfl:    meclizine  (ANTIVERT ) 12.5 MG tablet, Take 12.5 mg by mouth 3 (three) times daily as needed for dizziness., Disp: , Rfl:    nitroGLYCERIN  (NITROSTAT ) 0.4 MG SL tablet, Place 1 tablet (0.4 mg total) under the tongue every 5 (five) minutes x 3 doses as needed., Disp: 25 tablet, Rfl: 3        Objective:        BP 136/82 (BP Location: Left Arm, Patient Position: Sitting, Cuff Size: Large)   Pulse 72   Temp 98.2 F (36.8 C) (Temporal)   Ht 5' 6 (1.676 m)   Wt 199 lb (90.3 kg)   SpO2 96%   BMI 32.12 kg/m   Physical Exam CARDIOVASCULAR: Heart murmur present  Wt Readings from Last 3 Encounters:  06/08/24 199 lb (90.3 kg)  05/15/24 199 lb 2 oz (90.3  kg)  05/07/24 198 lb 8 oz (90 kg)    Physical Exam Vitals reviewed.  Constitutional:      General: She is not in acute distress.    Appearance: Normal appearance. She is normal weight. She is not ill-appearing, toxic-appearing or diaphoretic.  HENT:     Head: Normocephalic.  Cardiovascular:     Rate and Rhythm: Normal rate.  Pulmonary:     Effort: Pulmonary effort is normal.  Musculoskeletal:        General: Normal range of motion.  Neurological:     General: No focal deficit present.     Mental Status: She is alert and oriented to person, place, and time. Mental status is at baseline.  Psychiatric:        Mood and Affect: Mood normal.        Behavior: Behavior normal.        Thought Content:  Thought content normal.        Judgment: Judgment normal.          Results DIAGNOSTIC Echocardiogram: Normal ejection fraction, mild mitral regurgitation, mild aortic stenosis (09/16/2023) Cardiac catheterization: Mitral stenosis, mild aortic stenosis, normal ejection fraction (05/29/2018)  Assessment & Plan:   Assessment and Plan Assessment & Plan Situational anxiety, improved Situational anxiety has improved significantly, likely due to improved family-related stressors. Mirtazapine  was prescribed but not taken as symptoms have resolved. - Discontinue mirtazapine  for now, but keep it as an option if anxiety symptoms return.  Aortic stenosis and mitral regurgitation Mild aortic stenosis and mitral valve regurgitation noted on echocardiogram. Normal cardiac function with no significant symptoms reported. Shortness of breath on exertion may be due to deconditioning rather than cardiac issues. Regular follow-up with cardiology is ongoing. - Continue current medication regimen for cardiac health. - Monitor for increased shortness of breath or chest pain. - Continue regular follow-up with cardiology.  History of venous thromboembolism on anticoagulation On Eliquis  for anticoagulation due to history of venous thromboembolism. No recent episodes of thromboembolism reported. - Continue Eliquis  as prescribed.  Anemia, improved Anemia has improved based on recent CBC results. No further episodes of dark stools since initial presentation, likely related to iron supplementation. - Monitor hemoglobin and iron levels as needed.  Recording duration: 13 minutes      Return for as scheduled 09/2024.     [name], MSN, APRN, FNP-C [facility]

## 2024-06-15 ENCOUNTER — Telehealth: Payer: Self-pay

## 2024-06-15 ENCOUNTER — Ambulatory Visit

## 2024-06-15 NOTE — Telephone Encounter (Signed)
 Copied from CRM #[id number]. Topic: Clinical - Medical Advice >> Jun 15, 2024  4:20 PM Taleah C wrote: Reason for CRM: Pt called in and stated that she is a patient at [facility] in [place] and they are requesting for documentation to be sent for medical info/history of the patient;s heart conditions, medications like Eliquis , and if her conditions are under control. They ask for it to be sent to them as soon as possible to confirm if there is any dental restrictions. They're phone number is [phone], and fax # is [phone].  Please call and advise.

## 2024-06-16 NOTE — Telephone Encounter (Signed)
 Pt's last OV note and med list have been faxed to [facility].

## 2024-07-23 DIAGNOSIS — H26491 Other secondary cataract, right eye: Secondary | ICD-10-CM | POA: Diagnosis not present

## 2024-07-23 DIAGNOSIS — H43812 Vitreous degeneration, left eye: Secondary | ICD-10-CM | POA: Diagnosis not present

## 2024-07-23 DIAGNOSIS — H35351 Cystoid macular degeneration, right eye: Secondary | ICD-10-CM | POA: Diagnosis not present

## 2024-07-23 DIAGNOSIS — T1501XA Foreign body in cornea, right eye, initial encounter: Secondary | ICD-10-CM | POA: Diagnosis not present

## 2024-09-16 ENCOUNTER — Ambulatory Visit: Payer: Self-pay

## 2024-09-16 ENCOUNTER — Telehealth: Payer: Self-pay | Admitting: General Practice

## 2024-09-16 ENCOUNTER — Telehealth: Payer: Self-pay

## 2024-09-16 NOTE — Telephone Encounter (Signed)
 Called pt in regards to Eliquis .  Pt reports went to pick up medication today cost is $246 was previously $46.   Advised pt that our office has not refilled medication will need to f/u with PCP office.  Pt reports Dr. Court started her on medication and doesn't know why it's not showing our office refills medication.  Called [facility] to f/u cost was told pt needs to contact insurance there is nothing they can do.  Asked if pt previously had pt assistance that expired was told dont think so f/u with insurance.  Was told medication was last filled by Montie Pizza, MD April 2025 for a 9 month supply.  Called PCP office advised that Cardiology has not followed Eliquis  for pt PCP has.  Was told a Nurse will reach out to pt and or our office if needed.  Called pt to let her know PCP office will be f/u.  Pt expresses will be using last pill tonight wants to know if we can give her samples until she can get eliquis  straightened out.  Will send to Pharmacy team to advise.

## 2024-09-16 NOTE — Telephone Encounter (Signed)
 Copied from CRM [id number]. Topic: Clinical - Medication Question >> Sep 16, 2024  4:07 PM Viola F wrote: Reason for CRM: [name] from [facility] wants to know if Karen Owens is following patients apixaban  (ELIQUIS ) 5 MG TABS tablet medication? Patient has one more tablet left and is not sure who has been filling it. Please call patient with an update at  [phone]

## 2024-09-16 NOTE — Telephone Encounter (Signed)
 Pt c/o medication issue:  1. Name of Medication: apixaban  (ELIQUIS ) 5 MG TABS tablet   2. How are you currently taking this medication (dosage and times per day)? As written  3. Are you having a reaction (difficulty breathing--STAT)? no  4. What is your medication issue?   Pt states Eliquis  is now too expensive and it would cost way more. Pt requesting cheaper alternative and samples until then. Please advise.

## 2024-09-17 NOTE — Telephone Encounter (Signed)
 Patient has a $250 deductible it appears and then cost should go back to the $47/month. She can call her insurance to confirm this.

## 2024-09-17 NOTE — Telephone Encounter (Signed)
 Looks like her last prescription was sent in to the pharmacy in 2020. Pt by the office yesterday to ask if we had any samples of eliquis . Advised her that we do not carry samples. She stated that cardiology was the one who was giving this prescription to her. She was asking for samples due to the cost of the medication with her new insurance, it was going to cost over $200 for a 30 day supply and she can't afford this.

## 2024-09-17 NOTE — Telephone Encounter (Signed)
 All of this information was given to the pt yesterday when she came in asking for samples from us .

## 2024-09-17 NOTE — Telephone Encounter (Signed)
 We can, if ok with Dr. Court. She is on for PE and we haven't Rx it for her in a long time. She can also sign up for the payment plan by calling her insurance and she can pick up for free and then get bill from medicare later and pay throughout the year.

## 2024-09-17 NOTE — Telephone Encounter (Unsigned)
 Copied from CRM [id number]. Topic: Clinical - Medication Question >> Sep 16, 2024  4:07 PM Viola F wrote: Reason for CRM: [name] from [facility] wants to know if [name] is following patients apixaban  (ELIQUIS ) 5 MG TABS tablet medication? Patient has one more tablet left and is not sure who has been filling it. Please call patient with an update at  [phone] >> Sep 17, 2024  1:34 PM Emylou G wrote: Hamp from [facility] (Other) She said the patient is not on ( eliquis  ) it for her heart she is on it for history of PEs.. Will we fill it or does she need to go somewhere else?  Would like a call back.. she said there seems to be confusion.  [phone]

## 2024-09-17 NOTE — Telephone Encounter (Signed)
 Pt is requesting samples.  She is out of Eliquis , took last dose yesterday.  Does not have enough money to purchase the RX currently.   Are their any available and can she have some if so?

## 2024-09-17 NOTE — Telephone Encounter (Signed)
 She does have a history of 2 unprovoked DVT but also with history of nSTEMI  Per notes patient refills for eliquis  have been from cardiologist for at least the last 2-3 years Martell Beauvais NP mainly) there are notes in there as well from Dr. Court to resume anti coag. I have not yet filled for eliquis , so I had advised pt to call her cardiology office as they have been giving fills.

## 2024-09-17 NOTE — Telephone Encounter (Signed)
 Agree This was last prescribed by cardiology  I would have her f/u with them and let them know too pricy  They may actually have samples and or they may consider a change in a more affordable medication.

## 2024-09-21 ENCOUNTER — Ambulatory Visit: Admitting: Family

## 2024-09-21 ENCOUNTER — Encounter: Payer: Self-pay | Admitting: Family

## 2024-09-21 ENCOUNTER — Telehealth: Payer: Self-pay | Admitting: Family

## 2024-09-21 VITALS — BP 122/78 | HR 79 | Temp 98.3°F | Ht 67.0 in | Wt 199.4 lb

## 2024-09-21 DIAGNOSIS — Z7901 Long term (current) use of anticoagulants: Secondary | ICD-10-CM | POA: Diagnosis not present

## 2024-09-21 DIAGNOSIS — E782 Mixed hyperlipidemia: Secondary | ICD-10-CM

## 2024-09-21 DIAGNOSIS — Z86711 Personal history of pulmonary embolism: Secondary | ICD-10-CM

## 2024-09-21 DIAGNOSIS — D649 Anemia, unspecified: Secondary | ICD-10-CM

## 2024-09-21 DIAGNOSIS — Z0001 Encounter for general adult medical examination with abnormal findings: Secondary | ICD-10-CM

## 2024-09-21 DIAGNOSIS — R7989 Other specified abnormal findings of blood chemistry: Secondary | ICD-10-CM

## 2024-09-21 DIAGNOSIS — I10 Essential (primary) hypertension: Secondary | ICD-10-CM | POA: Diagnosis not present

## 2024-09-21 LAB — BASIC METABOLIC PANEL WITH GFR
BUN: 17 mg/dL (ref 6–23)
CO2: 29 meq/L (ref 19–32)
Calcium: 10.5 mg/dL (ref 8.4–10.5)
Chloride: 108 meq/L (ref 96–112)
Creatinine, Ser: 0.95 mg/dL (ref 0.40–1.20)
GFR: 56.18 mL/min — ABNORMAL LOW
Glucose, Bld: 82 mg/dL (ref 70–99)
Potassium: 3.7 meq/L (ref 3.5–5.1)
Sodium: 142 meq/L (ref 135–145)

## 2024-09-21 LAB — CBC
HCT: 34 % — ABNORMAL LOW (ref 36.0–46.0)
Hemoglobin: 11.6 g/dL — ABNORMAL LOW (ref 12.0–15.0)
MCHC: 34.1 g/dL (ref 30.0–36.0)
MCV: 96.6 fl (ref 78.0–100.0)
Platelets: 198 K/uL (ref 150.0–400.0)
RBC: 3.52 Mil/uL — ABNORMAL LOW (ref 3.87–5.11)
RDW: 13.8 % (ref 11.5–15.5)
WBC: 6.2 K/uL (ref 4.0–10.5)

## 2024-09-21 LAB — IBC + FERRITIN
Ferritin: 104.1 ng/mL (ref 10.0–291.0)
Iron: 94 ug/dL (ref 42–145)
Saturation Ratios: 35.2 % (ref 20.0–50.0)
TIBC: 267.4 ug/dL (ref 250.0–450.0)
Transferrin: 191 mg/dL — ABNORMAL LOW (ref 212.0–360.0)

## 2024-09-21 LAB — VITAMIN D 25 HYDROXY (VIT D DEFICIENCY, FRACTURES): VITD: 26.52 ng/mL — ABNORMAL LOW (ref 30.00–100.00)

## 2024-09-21 LAB — MICROALBUMIN / CREATININE URINE RATIO
Creatinine,U: 138.4 mg/dL
Microalb Creat Ratio: 5.3 mg/g (ref 0.0–30.0)
Microalb, Ur: 0.7 mg/dL (ref 0.7–1.9)

## 2024-09-21 LAB — LIPID PANEL
Cholesterol: 196 mg/dL (ref 28–200)
HDL: 73.6 mg/dL
LDL Cholesterol: 114 mg/dL — ABNORMAL HIGH (ref 10–99)
NonHDL: 122.8
Total CHOL/HDL Ratio: 3
Triglycerides: 43 mg/dL (ref 10.0–149.0)
VLDL: 8.6 mg/dL (ref 0.0–40.0)
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
## 2024-09-21 NOTE — Patient Instructions (Addendum)
" °  Let me know about the eliquis  if you do not hear from my pharmacist x one week let me know.   Stop the hydrochlorothiazide  monitor your blood pressure   Let me know the name of the foot doctor you would like to be referred to   Call dermatology for your yearly appointment.   "

## 2024-09-21 NOTE — Telephone Encounter (Signed)
 Pt on eliquis  therapy but cost around 249$ last fill  Cardiology office is back and forth about prescribing, trying to move it to pcp as h/o PE. She had PE 2019 and 2020 so I suspect should be on long term anti coag. She also has a h/o NSTEMI.   Question is, anything more affordable that would still be applicable to the co morbidities?

## 2024-09-24 ENCOUNTER — Ambulatory Visit: Payer: Self-pay | Admitting: Family

## 2024-09-24 NOTE — Telephone Encounter (Signed)
 Unable to leave message for pt, voicemail is full.  Will attempt to send a mychart message.

## 2024-09-25 ENCOUNTER — Telehealth: Payer: Self-pay | Admitting: Pharmacist

## 2024-09-25 DIAGNOSIS — Z86711 Personal history of pulmonary embolism: Secondary | ICD-10-CM

## 2024-09-25 NOTE — Progress Notes (Signed)
" ° °  09/25/2024 Name: Karen Owens MRN: [id number] DOB: Oct 04, 1940  Brief Telephone Documentation Reason for Call: Medication Access concern related to Eliquis .   Summary of Call: Called patient though no answer, voicemail is full, unable to leave a message.   ________________________________________ Chart Review: medication access/anticoag recommendation(s)  Care Team: Primary Care Provider: Dugal, [name], FNP   Medication Access: ?  Eliquis  copay - $249 per patient MyChart message to PCP  Eliquis  has been managed by [facility] since 2019, though cardiology now states preference for PCP management   Prescription drug coverage: YES [employer] Medicare Gold Plus [phone] 335) Summary of Benefit: [email or web address] $250 drug deductible, then all preferred brand meds = $47/month the remainder of the year    Pertinent PMH: HTN, HLD, DVT/PE (2019), saddle PE (2020)  HPI - Chronic anticoagulation: Eliquis  start: 2019 related to DVT/PE C/f NSTEMI though coronaries reassuringly normal on cath, presumed non-coronary chest pain vs possible coronary vasospasm. Plan was to discharge on ASA, though with elevated D-dimer, imaging obtained. VQ scan intermediate probability and doppler showing DVT at femoral and popliteal veins. Discharged on Eliquis  x6 months with f/u 06/20/18.   10/11 cardiology follow up: oral anticoag for at least 3 months to recheck venous Doppler studies  10/28/18 cardiology follow up: Eliquis  recently d/c at ED d/t melena. No GI workup. Her venous Dopplers performed 09/30/2018 showed marked improvement in her left lower extremity DVT now with chronic below the knee popliteal vein occlusion.   07/12/19: saddle PE. Eliquis  resumed after negative GI work up.  Assessment and Plan:   1. Medication Access/chronic anticoagulation - Lifelong anticoag appropriate per recurrent, unprovoked PE. Hx of melena on Eliquis   prior though resumed with second PE diagnosis in 2020 after normal GI workup. No further c/f GIB since this time.  Medicare insurance this year is the same as last year for her - Eliquis  will return to $47/month after 1-time deductible payment of $250.  If income <50k may be eligible for Xarelto patient assistance though this is not the most ideal option in the setting of her hx GIB on Eliquis  (lower GIB risk in elderly with Eliquis  compared to xarelto)  Screen for Medicare LIS   Unable to leave patient a voicemail, will attempt second outreach this afternoon.   Manuelita FABIENE Kobs, PharmD Clinical Pharmacist [facility] [phone]  "

## 2024-09-25 NOTE — Telephone Encounter (Unsigned)
 Copied from CRM [id number]. Topic: Clinical - Medication Question >> Sep 25, 2024  2:21 PM Larissa RAMAN wrote: Reason for CRM: Patient calling to inform provider that she has not received phone call regarding Eliquis 

## 2024-09-30 ENCOUNTER — Telehealth: Payer: Self-pay | Admitting: Pharmacist

## 2024-09-30 NOTE — Progress Notes (Signed)
 Brief Telephone Documentation Reason for Call: Outreach attempt to discuss medication cost concerns surrounding Eliquis   Summary of Call: Second outreach attempt.  No answer, voicemail is full. Unable to leave message.   Follow Up: Patient given direct line for further questions/concerns.  Manuelita FABIENE Kobs, PharmD, BCACP, CPP Clinical Pharmacist Practitioner [facility] Ph: [phone]

## 2024-10-15 ENCOUNTER — Telehealth: Payer: Self-pay | Admitting: Pharmacist

## 2024-10-15 NOTE — Progress Notes (Signed)
 Brief Telephone Documentation Reason for Call: Outreach attempt to discuss medication cost concerns surrounding Eliquis   Summary of Call: Third outreach attempt.  No answer, voicemail is full. Unable to leave message.
